# Patient Record
Sex: Female | Born: 1955 | Race: White | Hispanic: No | Smoking: Never smoker
Health system: Southern US, Community
[De-identification: ages and names within clinical notes are randomized; demographics above are authoritative.]

## PROBLEM LIST (undated history)

## (undated) DIAGNOSIS — G2401 Drug induced subacute dyskinesia: Secondary | ICD-10-CM

## (undated) DIAGNOSIS — N183 Chronic kidney disease, stage 3 unspecified: Secondary | ICD-10-CM

## (undated) DIAGNOSIS — E119 Type 2 diabetes mellitus without complications: Secondary | ICD-10-CM

## (undated) DIAGNOSIS — E039 Hypothyroidism, unspecified: Secondary | ICD-10-CM

## (undated) DIAGNOSIS — H269 Unspecified cataract: Secondary | ICD-10-CM

## (undated) DIAGNOSIS — F3160 Bipolar disorder, current episode mixed, unspecified: Secondary | ICD-10-CM

## (undated) DIAGNOSIS — R3915 Urgency of urination: Secondary | ICD-10-CM

## (undated) DIAGNOSIS — N393 Stress incontinence (female) (male): Secondary | ICD-10-CM

## (undated) DIAGNOSIS — I10 Essential (primary) hypertension: Secondary | ICD-10-CM

## (undated) DIAGNOSIS — Z9989 Dependence on other enabling machines and devices: Secondary | ICD-10-CM

## (undated) DIAGNOSIS — R030 Elevated blood-pressure reading, without diagnosis of hypertension: Secondary | ICD-10-CM

## (undated) DIAGNOSIS — R351 Nocturia: Secondary | ICD-10-CM

## (undated) DIAGNOSIS — G4733 Obstructive sleep apnea (adult) (pediatric): Secondary | ICD-10-CM

## (undated) DIAGNOSIS — J302 Other seasonal allergic rhinitis: Secondary | ICD-10-CM

## (undated) DIAGNOSIS — M1712 Unilateral primary osteoarthritis, left knee: Secondary | ICD-10-CM

## (undated) DIAGNOSIS — G5600 Carpal tunnel syndrome, unspecified upper limb: Secondary | ICD-10-CM

## (undated) DIAGNOSIS — H9190 Unspecified hearing loss, unspecified ear: Secondary | ICD-10-CM

## (undated) DIAGNOSIS — E785 Hyperlipidemia, unspecified: Secondary | ICD-10-CM

## (undated) DIAGNOSIS — F419 Anxiety disorder, unspecified: Secondary | ICD-10-CM

## (undated) HISTORY — DX: Anxiety disorder, unspecified: F41.9

## (undated) HISTORY — DX: Drug induced subacute dyskinesia: G24.01

## (undated) HISTORY — DX: Carpal tunnel syndrome, unspecified upper limb: G56.00

## (undated) HISTORY — DX: Hyperlipidemia, unspecified: E78.5

## (undated) HISTORY — DX: Essential (primary) hypertension: I10

## (undated) HISTORY — DX: Hypothyroidism, unspecified: E03.9

## (undated) HISTORY — PX: OTHER SURGICAL HISTORY: SHX169

## (undated) HISTORY — PX: TUBAL LIGATION: SHX77

---

## 2000-05-31 ENCOUNTER — Ambulatory Visit (HOSPITAL_COMMUNITY): Admission: RE | Admit: 2000-05-31 | Discharge: 2000-05-31 | Payer: Self-pay | Admitting: Internal Medicine

## 2000-05-31 ENCOUNTER — Encounter: Payer: Self-pay | Admitting: Internal Medicine

## 2003-09-12 ENCOUNTER — Inpatient Hospital Stay (HOSPITAL_COMMUNITY): Admission: AD | Admit: 2003-09-12 | Discharge: 2003-09-18 | Payer: Self-pay | Admitting: Psychiatry

## 2009-06-28 ENCOUNTER — Emergency Department (HOSPITAL_COMMUNITY): Admission: EM | Admit: 2009-06-28 | Discharge: 2009-06-28 | Payer: Self-pay | Admitting: Emergency Medicine

## 2010-06-11 LAB — POCT I-STAT, CHEM 8
BUN: 10 mg/dL (ref 6–23)
Calcium, Ion: 0.99 mmol/L — ABNORMAL LOW (ref 1.12–1.32)
Chloride: 115 mEq/L — ABNORMAL HIGH (ref 96–112)
Creatinine, Ser: 1.7 mg/dL — ABNORMAL HIGH (ref 0.4–1.2)
Glucose, Bld: 113 mg/dL — ABNORMAL HIGH (ref 70–99)
HCT: 34 % — ABNORMAL LOW (ref 36.0–46.0)
Hemoglobin: 11.6 g/dL — ABNORMAL LOW (ref 12.0–15.0)
Potassium: 4.2 mEq/L (ref 3.5–5.1)
Sodium: 144 mEq/L (ref 135–145)
TCO2: 16 mmol/L (ref 0–100)

## 2010-08-08 NOTE — Discharge Summary (Signed)
Denise Serrano, Denise Serrano                         ACCOUNT NO.:  [id number]   MEDICAL RECORD NO.:  [id number]                   PATIENT TYPE:  IPS   LOCATION:  0502                                 FACILITY:  [facility]   PHYSICIAN:  Rulon Eisenmenger, M.D.              DATE OF BIRTH:  1955-09-20   DATE OF ADMISSION:  09/12/2003  DATE OF DISCHARGE:  09/18/2003                                 DISCHARGE SUMMARY   IDENTIFYING DATA:  This is a 55 year old Caucasian female, divorced,  voluntarily admitted with a 15-year history of bipolar disorder.  Presenting  to therapist with suicidal ideation.  Had taken too much Lamictal over the  past three days, possibly 400 mg on Tuesday.  The patient reported that she  was trying to get attention.  Endorsed increased stress, mood lability for a  year.  Worse now over the past month with increased irritability, restless  sleep, decreased concentration, rapid, racing thoughts.  Denied auditory or  visual hallucinations.  Positive getting confused regarding medications.  History of manic episode with increased spending and hypersexuality in the  past.   PAST PSYCHIATRIC HISTORY:  Had been followed up by Dr. Toy Care.  [place] admission.  First psychiatric inpatient treatment.  No prior suicide attempts.   MEDICATIONS:  Lamictal (supposed to be on 125 mg possibly to increase to 150  mg or 200 mg; patient was unclear), Effexor XR 150 mg b.i.d. x 2 months (had  been on this increased dose), Wellbutrin SR 200 mg b.i.d. and Depakote 500  mg q.h.s.   ALLERGIES:  No known drug allergies.   PHYSICAL EXAMINATION:  Physical exam and neurologic essentially within  normal limits.  Mild fine tremor with no coordination difficulties.   MENTAL STATUS EXAM:  Fully alert, slightly tremulous with bright affect,  labile at times.  Decreased attention and concentration.  Mood normal.  Volume rate and tone, somewhat hyperverbal at times.  Mood depressed.  Affect somewhat labile at times.  Thought processes goal directed.  Rapid  but mostly goal directed, occasionally tangential.  Positive suicidal  ideation.  Cognitively intact.  Some impulsive or intrusive thoughts.  Insight and judgment were fair.   ADMISSION DIAGNOSES:   AXIS I:  Bipolar disorder, type 1, mixed state.   AXIS II:  Deferred.   AXIS III:  Hypertension by history.   AXIS IV:  Moderate (stressors including parenting and job issues and limited  support system).   AXIS V:  24/65.   HOSPITAL COURSE:  The patient was admitted and ordered routine p.r.n.  medications and underwent further monitoring.  Was encouraged to participate  in individual, group and milieu therapy.  The patient was placed on safety  checks.  Effexor was decreased as indicated due to mood lability.  Wellbutrin was changed to 300 mg XL q.a.m. and Ativan 0.5 mg t.i.d. p.r.n.  anxiety.  Ambien was used for sleep  and Depakote was optimized.  Lamictal  held due to overdose.  The patient remained somewhat manic with a labile  mood and racing thoughts.  Valproic acid level was 26.  Question regarding  Depakote overdose.  The patient was anxious and admitted to impulsivity and  passive suicidal ideation.  Depakote was optimized and patient was monitored  regarding response and safety as well as participate in individual, group  and milieu therapy and aftercare planning.  Depakote was further optimized.  Level rechecked.  The patient felt a positive response from medication  changed.  Again, Lamictal was discontinued and Depakote further optimized.  Effexor had been decreased and Wellbutrin changed from SR form to XL.   CONDITION ON DISCHARGE:  The patient showed remarkable improvement.  Mood  was more euthymic.  Affect brighter.  The patient was clam with no psychotic  symptoms.  No dangerous ideation.  No significant anxiety or panic symptoms.  No other risk issues.  She was given medication education.    DISCHARGE MEDICATIONS:  1. Effexor XR 150 mg q.a.m.  2. Wellbutrin XL 300 mg q.a.m.  3. Depakote ER 500 mg, 3 q.h.s.  4. Ambien 10 mg q.h.s. p.r.n. insomnia.  5. Ativan 0.5 mg t.i.d. p.r.n. severe anxiety.   FOLLOW UP:  The patient was given a copy of labs to take to primary care  physician for follow-up regarding hormone levels and to follow up with Dr.  Toy Care on October 03, 2003 at 4:30 p.m. and Tomi Bamberger on September 25, 2003 at 10  a.m.   DISCHARGE DIAGNOSES:   AXIS I:  Bipolar disorder, type 1, mixed state.   AXIS II:  Deferred.   AXIS III:  Hypertension by history.   AXIS IV:  Moderate (stressors including parenting and job issues and limited  support system).   AXIS V:  Global Assessment of Functioning on discharge 55.                                               Rulon Eisenmenger, M.D.    JEM/MEDQ  D:  10/12/2003  T:  10/13/2003  Job:  [id number]

## 2010-10-22 ENCOUNTER — Encounter: Payer: Self-pay | Admitting: Podiatry

## 2010-10-22 DIAGNOSIS — L600 Ingrowing nail: Secondary | ICD-10-CM | POA: Insufficient documentation

## 2011-03-30 ENCOUNTER — Other Ambulatory Visit: Payer: Self-pay | Admitting: Urology

## 2011-05-01 ENCOUNTER — Encounter (HOSPITAL_BASED_OUTPATIENT_CLINIC_OR_DEPARTMENT_OTHER): Payer: Self-pay | Admitting: *Deleted

## 2011-05-01 ENCOUNTER — Other Ambulatory Visit: Payer: Self-pay | Admitting: Urology

## 2011-05-01 NOTE — Progress Notes (Addendum)
NPO AFTER MN. NEEDS CBC, BMET, PT/PTT, T&S AND EKG. WILL TAKE SYNTHROID AND DITROPAN AM OF SURG. W/ SIP OF WATER.   ARRIVES AT 0745.

## 2011-05-04 ENCOUNTER — Encounter (HOSPITAL_BASED_OUTPATIENT_CLINIC_OR_DEPARTMENT_OTHER): Payer: Self-pay | Admitting: *Deleted

## 2011-05-06 NOTE — H&P (Signed)
History of Present Illness   Denise Serrano primarily has stress incontinence and a little bit of enuresis. She failed 3 medications, initially followed by Dr. Terance Hart. She had a positive cough test and she used to be on Septra for chronic cystitis. She has mild frequency and nocturia. 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 She has some issues with bipolar illness.   [facility] wanted a BMP and urinalysis ordered today and we will fax the results to them.  On urodynamics, she did not void and was catheterized for 150 mL. I did not a have a residual from her first visit. Her maximum capacity was 726 mL. Her bladder was stable. She had mild leakage at a Valsalva leak point pressure of 87 cm of water. It was as low as 25 cm of water at 400 mL, but it was mild. During voiding, she voided 671 mL with a maximum flow of 22 mL per second. Maximum voiding pressure is 34 cm of water. Her residual was 55 mL. She had increasing EMG activity during her voiding phase. Bladder neck descended 2 to 3 cm. The details of the urodynamics was signed and dictated on the urodynamics sheet.  Review of systems: No change in bowel or neurologic systems.  Urinalysis: I reviewed. Moderate bacteria. Urine sent for culture. Her last culture was normal.    Past Medical History Problems  1. History of  Anxiety (Symptom) 300.00 2. History of  Arthritis V13.4 3. History of  Asthma 493.90 4. History of  Esophageal Reflux 530.81 5. History of  Hypercholesterolemia 272.0 6. History of  Hypertension 401.9  Surgical History Problems  1. History of  No Surgical Problems 2. History of  Partial Permanent Excision Nail, Matrix Left First Toe  Current Meds 1. Aspirin 81 MG Oral Tablet; Therapy: (Recorded:08Jun2011) to 2. Astepro SOLN; Therapy: (Recorded:14Nov2011) to 3. Calcium TABS; Therapy: (Recorded:14Nov2011) to 4. Geodon 60 MG Oral Capsule; Therapy: (Recorded:14Nov2011) to 5.  Levothyroxine Sodium 50 MCG Oral Tablet; Therapy: [id number] to 6. Multi-Vitamin Oral Tablet; Therapy: (Recorded:14Nov2011) to 7. Oxybutynin Chloride TABS; Therapy: (Recorded:12Nov2012) to 8. ProAir HFA AERS; Therapy: (Recorded:14Nov2011) to 9. Probiotic CAPS; Therapy: (Recorded:14Nov2011) to 10. Stool Softener TABS; Therapy: (Recorded:14Nov2011) to 11. Symbyax 6-25 MG Oral Capsule; Therapy: (Recorded:16May2012) to 12. Valtrex 500 MG Oral Tablet; Therapy: (Recorded:16May2012) to 13. Vitamin D TABS; Therapy: (Recorded:16May2012) to 14. Wellbutrin XL 300 MG Oral Tablet Extended Release 24 Hour; Therapy: (Recorded:14Aug2012) to 15. Xanax 0.5 MG Oral Tablet; Therapy: (Recorded:08Jun2011) to  Allergies Medication  1. No Known Drug Allergies  Family History Problems  1. Family history of  Death In The Family Father deceased age 73--lung cancer 2. Family history of  Death In The Family Mother deceased age 74 3. Family history of  Family Health Status Number Of Children 1 son 1 daughter 4. Paternal history of  Lung Cancer V16.1  Social History Problems  1. Caffeine Use 2 tea 2. Marital History - Single 3. Never A Smoker 4. Physical Disability Affecting Ability To Work Denied  5. History of  Alcohol Use 6. History of  Tobacco Use  Results/Data  20 Mar 2011 1:17 PM   UA With REFLEX       COLOR YELLOW       APPEARANCE CLEAR       SPECIFIC GRAVITY 1.020       pH 6.0       GLUCOSE NEG       BILIRUBIN NEG  KETONE NEG       BLOOD TRACE       PROTEIN NEG       UROBILINOGEN 0.2       NITRITE NEG       LEUKOCYTE ESTERASE MOD       SQUAMOUS EPITHELIAL/HPF RARE       WBC TNTC       CRYSTALS NONE SEEN       CASTS NONE SEEN       RBC 0-3       BACTERIA MODERATE       Other FEW RTE OBS      Assessment Assessed  1. Chronic Cystitis 595.2 2. Female Stress Incontinence 625.6  Plan   Discussion/Summary   I drew Denise Serrano a picture. I think her primary problem is  stress incontinence. I talked to her about watchful waiting versus physical therapy and behavioral therapy versus a sling.  We talked about a sling in detail. Pros, cons, general surgical and anesthetic risks, and other options including behavioral therapy and watchful waiting were discussed. She understands that slings are generally successful in 90% of cases for stress incontinence, 50% for urge incontinence, and that in a small percentage of cases the incontinence can worsen. The risk of persistent, de novo, or worsening incontinence/dysfunction was discussed. Risks were described but not limited to the discussion of injury to neighboring structures including the bowel (with possible life-threatening sepsis and colostomy), bladder, urethra, vagina (all resulting in further surgery), and ureter (resulting in re-implantation). We also talked about the risk of retention requiring urethrolysis, extrusion requiring revision, and erosion resulting in further surgery. Bleeding risks and transfusion rates and the risk of infection were discussed. The risk of pelvic and abdominal pain syndromes, dyspareunia, and neuropathies were discussed. The need for CIC was described as well the usual post-operative course. The patient understands that she might not reach her treatment goal and that she might be worse following surgery.  She understands that her enuresis may persist. She has failed medications.   Twenty-five minutes or longer was used for review and discussion with the patient.  After a thorough review of the management options for the patient's condition the patient  elected to proceed with surgical therapy as noted above. We have discussed the potential benefits and risks of the procedure, side effects of the proposed treatment, the likelihood of the patient achieving the goals of the procedure, and any potential problems that might occur during the procedure or recuperation. Informed consent has been  obtained.

## 2011-05-07 ENCOUNTER — Other Ambulatory Visit: Payer: Self-pay

## 2011-05-07 ENCOUNTER — Encounter (HOSPITAL_BASED_OUTPATIENT_CLINIC_OR_DEPARTMENT_OTHER): Payer: Self-pay | Admitting: Anesthesiology

## 2011-05-07 ENCOUNTER — Ambulatory Visit (HOSPITAL_BASED_OUTPATIENT_CLINIC_OR_DEPARTMENT_OTHER)
Admission: RE | Admit: 2011-05-07 | Discharge: 2011-05-07 | Disposition: A | Discharge status: Home / Self Care | Payer: Medicare Other | Source: Ambulatory Visit | Attending: Urology | Admitting: Urology

## 2011-05-07 ENCOUNTER — Encounter (HOSPITAL_BASED_OUTPATIENT_CLINIC_OR_DEPARTMENT_OTHER)
Admission: RE | Disposition: A | Discharge status: Home / Self Care | Payer: Self-pay | Source: Ambulatory Visit | Attending: Urology

## 2011-05-07 ENCOUNTER — Encounter (HOSPITAL_BASED_OUTPATIENT_CLINIC_OR_DEPARTMENT_OTHER): Payer: Self-pay | Admitting: *Deleted

## 2011-05-07 ENCOUNTER — Ambulatory Visit (HOSPITAL_BASED_OUTPATIENT_CLINIC_OR_DEPARTMENT_OTHER): Payer: Medicare Other | Admitting: Anesthesiology

## 2011-05-07 DIAGNOSIS — Z7982 Long term (current) use of aspirin: Secondary | ICD-10-CM | POA: Insufficient documentation

## 2011-05-07 DIAGNOSIS — N393 Stress incontinence (female) (male): Secondary | ICD-10-CM | POA: Insufficient documentation

## 2011-05-07 DIAGNOSIS — K219 Gastro-esophageal reflux disease without esophagitis: Secondary | ICD-10-CM | POA: Insufficient documentation

## 2011-05-07 DIAGNOSIS — J45909 Unspecified asthma, uncomplicated: Secondary | ICD-10-CM | POA: Insufficient documentation

## 2011-05-07 DIAGNOSIS — N3020 Other chronic cystitis without hematuria: Secondary | ICD-10-CM | POA: Insufficient documentation

## 2011-05-07 DIAGNOSIS — I10 Essential (primary) hypertension: Secondary | ICD-10-CM | POA: Insufficient documentation

## 2011-05-07 DIAGNOSIS — E7800 Pure hypercholesterolemia, unspecified: Secondary | ICD-10-CM | POA: Insufficient documentation

## 2011-05-07 DIAGNOSIS — Z79899 Other long term (current) drug therapy: Secondary | ICD-10-CM | POA: Insufficient documentation

## 2011-05-07 HISTORY — DX: Chronic kidney disease, stage 3 unspecified: N18.30

## 2011-05-07 HISTORY — DX: Chronic kidney disease, stage 3 (moderate): N18.3

## 2011-05-07 HISTORY — DX: Elevated blood-pressure reading, without diagnosis of hypertension: R03.0

## 2011-05-07 HISTORY — DX: Bipolar disorder, current episode mixed, unspecified: F31.60

## 2011-05-07 HISTORY — DX: Obstructive sleep apnea (adult) (pediatric): G47.33

## 2011-05-07 HISTORY — DX: Urgency of urination: R39.15

## 2011-05-07 HISTORY — DX: Unilateral primary osteoarthritis, left knee: M17.12

## 2011-05-07 HISTORY — DX: Dependence on other enabling machines and devices: Z99.89

## 2011-05-07 HISTORY — DX: Unspecified cataract: H26.9

## 2011-05-07 HISTORY — DX: Other seasonal allergic rhinitis: J30.2

## 2011-05-07 HISTORY — DX: Nocturia: R35.1

## 2011-05-07 HISTORY — PX: BLADDER SUSPENSION: SHX72

## 2011-05-07 HISTORY — DX: Stress incontinence (female) (male): N39.3

## 2011-05-07 HISTORY — DX: Unspecified hearing loss, unspecified ear: H91.90

## 2011-05-07 LAB — BASIC METABOLIC PANEL
BUN: 32 mg/dL — ABNORMAL HIGH (ref 6–23)
CO2: 27 mEq/L (ref 19–32)
Calcium: 9.4 mg/dL (ref 8.4–10.5)
Chloride: 103 mEq/L (ref 96–112)
Creatinine, Ser: 1.7 mg/dL — ABNORMAL HIGH (ref 0.50–1.10)
Glucose, Bld: 92 mg/dL (ref 70–99)

## 2011-05-07 LAB — CBC
HCT: 41.3 % (ref 36.0–46.0)
Hemoglobin: 13.7 g/dL (ref 12.0–15.0)
MCH: 32.2 pg (ref 26.0–34.0)
MCV: 96.9 fL (ref 78.0–100.0)
Platelets: 191 10*3/uL (ref 150–400)
RBC: 4.26 MIL/uL (ref 3.87–5.11)

## 2011-05-07 LAB — TYPE AND SCREEN: Antibody Screen: NEGATIVE

## 2011-05-07 SURGERY — CREATION, URETHRAL SLING, RETROPUBIC APPROACH, USING POLYPROPYLENE TAPE
Anesthesia: General | Site: Vagina | Wound class: Clean Contaminated

## 2011-05-07 MED ORDER — ONDANSETRON HCL 4 MG/2ML IJ SOLN
INTRAMUSCULAR | Status: DC | PRN
  Administered 2011-05-07: 4 mg via INTRAVENOUS

## 2011-05-07 MED ORDER — INDIGOTINDISULFONATE SODIUM 8 MG/ML IJ SOLN
INTRAMUSCULAR | Status: DC | PRN
  Administered 2011-05-07: 40 mg via INTRAVENOUS

## 2011-05-07 MED ORDER — AMPICILLIN-SULBACTAM SODIUM 1.5 (1-0.5) G IJ SOLR
1.5000 g | INTRAMUSCULAR | Status: AC
  Administered 2011-05-07: 08:00:00 via INTRAVENOUS

## 2011-05-07 MED ORDER — CIPROFLOXACIN HCL 250 MG PO TABS: 250.0000 mg | ORAL_TABLET | Freq: Two times a day (BID) | ORAL | Status: AC

## 2011-05-07 MED ORDER — ESTRADIOL 0.1 MG/GM VA CREA
TOPICAL_CREAM | VAGINAL | Status: DC | PRN
  Administered 2011-05-07: 1 via VAGINAL

## 2011-05-07 MED ORDER — HYDROCODONE-ACETAMINOPHEN 5-325 MG PO TABS
1.0000 | ORAL_TABLET | Freq: Four times a day (QID) | ORAL | Status: DC | PRN
  Administered 2011-05-07: 1 via ORAL

## 2011-05-07 MED ORDER — LIDOCAINE HCL (CARDIAC) 20 MG/ML IV SOLN
INTRAVENOUS | Status: DC | PRN
  Administered 2011-05-07: 100 mg via INTRAVENOUS

## 2011-05-07 MED ORDER — PROMETHAZINE HCL 25 MG/ML IJ SOLN: 6.2500 mg | INTRAMUSCULAR | Status: DC | PRN

## 2011-05-07 MED ORDER — FENTANYL CITRATE 0.05 MG/ML IJ SOLN: 25.0000 ug | INTRAMUSCULAR | Status: DC | PRN

## 2011-05-07 MED ORDER — SODIUM CHLORIDE 0.9 % IR SOLN
Status: DC | PRN
  Administered 2011-05-07: 10:00:00

## 2011-05-07 MED ORDER — HYDROCODONE-ACETAMINOPHEN 5-500 MG PO TABS: 1.0000 | ORAL_TABLET | Freq: Four times a day (QID) | ORAL | Status: AC | PRN

## 2011-05-07 MED ORDER — LIDOCAINE-EPINEPHRINE (PF) 1 %-1:200000 IJ SOLN
INTRAMUSCULAR | Status: DC | PRN
  Administered 2011-05-07: 3 mL

## 2011-05-07 MED ORDER — MIDAZOLAM HCL 5 MG/5ML IJ SOLN
INTRAMUSCULAR | Status: DC | PRN
  Administered 2011-05-07: 2 mg via INTRAVENOUS

## 2011-05-07 MED ORDER — SUCCINYLCHOLINE CHLORIDE 20 MG/ML IJ SOLN
INTRAMUSCULAR | Status: DC | PRN
  Administered 2011-05-07: 80 mg via INTRAVENOUS

## 2011-05-07 MED ORDER — STERILE WATER FOR IRRIGATION IR SOLN
Status: DC | PRN
  Administered 2011-05-07: 3000 mL

## 2011-05-07 MED ORDER — DEXAMETHASONE SODIUM PHOSPHATE 4 MG/ML IJ SOLN
INTRAMUSCULAR | Status: DC | PRN
  Administered 2011-05-07: 4 mg via INTRAVENOUS

## 2011-05-07 MED ORDER — LACTATED RINGERS IV SOLN
INTRAVENOUS | Status: DC | PRN
  Administered 2011-05-07: 09:00:00 via INTRAVENOUS

## 2011-05-07 MED ORDER — FENTANYL CITRATE 0.05 MG/ML IJ SOLN
INTRAMUSCULAR | Status: DC | PRN
  Administered 2011-05-07 (×2): 100 ug via INTRAVENOUS

## 2011-05-07 MED ORDER — PROPOFOL 10 MG/ML IV EMUL
INTRAVENOUS | Status: DC | PRN
  Administered 2011-05-07: 200 mg via INTRAVENOUS

## 2011-05-07 MED ORDER — GENTAMICIN IN SALINE 1.6-0.9 MG/ML-% IV SOLN
80.0000 mg | INTRAVENOUS | Status: AC
  Administered 2011-05-07: 80 mg via INTRAVENOUS

## 2011-05-07 MED ORDER — LACTATED RINGERS IV SOLN
INTRAVENOUS | Status: DC
  Administered 2011-05-07 (×2): via INTRAVENOUS

## 2011-05-07 SURGICAL SUPPLY — 55 items
BAG URINE DRAINAGE (UROLOGICAL SUPPLIES) IMPLANT
BLADE HEX COATED 2.75 (ELECTRODE) ×2 IMPLANT
BLADE SURG 10 STRL SS (BLADE) ×2 IMPLANT
BLADE SURG 15 STRL LF DISP TIS (BLADE) ×1 IMPLANT
BLADE SURG 15 STRL SS (BLADE) ×1
BLADE SURG ROTATE 9660 (MISCELLANEOUS) ×2 IMPLANT
CANISTER SUCT LVC 12 LTR MEDI- (MISCELLANEOUS) IMPLANT
CANISTER SUCTION 1200CC (MISCELLANEOUS) ×4 IMPLANT
CATH FOLEY 2WAY SLVR  5CC 16FR (CATHETERS) ×1
CATH FOLEY 2WAY SLVR 5CC 16FR (CATHETERS) ×1 IMPLANT
CLOTH BEACON ORANGE TIMEOUT ST (SAFETY) ×2 IMPLANT
COVER MAYO STAND STRL (DRAPES) ×4 IMPLANT
COVER TABLE BACK 60X90 (DRAPES) ×2 IMPLANT
DERMABOND ADHESIVE PROPEN (GAUZE/BANDAGES/DRESSINGS)
DERMABOND ADVANCED (GAUZE/BANDAGES/DRESSINGS) ×1
DERMABOND ADVANCED .7 DNX12 (GAUZE/BANDAGES/DRESSINGS) ×1 IMPLANT
DERMABOND ADVANCED .7 DNX6 (GAUZE/BANDAGES/DRESSINGS) IMPLANT
DRAIN PENROSE 18X1/4 LTX STRL (WOUND CARE) IMPLANT
DRAPE UNDERBUTTOCKS STRL (DRAPE) ×2 IMPLANT
ELECT REM PT RETURN 9FT ADLT (ELECTROSURGICAL) ×2
ELECTRODE REM PT RTRN 9FT ADLT (ELECTROSURGICAL) ×1 IMPLANT
GAUZE SPONGE 4X4 16PLY XRAY LF (GAUZE/BANDAGES/DRESSINGS) IMPLANT
GLOVE BIO SURGEON STRL SZ7.5 (GLOVE) ×4 IMPLANT
GLOVE BIOGEL PI IND STRL 6.5 (GLOVE) ×1 IMPLANT
GLOVE BIOGEL PI INDICATOR 6.5 (GLOVE) ×1
GLOVE ECLIPSE 6.0 STRL STRAW (GLOVE) ×2 IMPLANT
GOWN STRL NON-REIN LRG LVL3 (GOWN DISPOSABLE) ×4 IMPLANT
GOWN STRL REIN XL XLG (GOWN DISPOSABLE) ×2 IMPLANT
HOLDER FOLEY CATH W/STRAP (MISCELLANEOUS) ×2 IMPLANT
HOOK RETRACTION 12 ELAST STAY (MISCELLANEOUS) IMPLANT
NEEDLE HYPO 22GX1.5 SAFETY (NEEDLE) ×2 IMPLANT
NS IRRIG 500ML POUR BTL (IV SOLUTION) IMPLANT
PACK BASIN DAY SURGERY FS (CUSTOM PROCEDURE TRAY) ×2 IMPLANT
PACKING VAGINAL (PACKING) ×2 IMPLANT
PENCIL BUTTON HOLSTER BLD 10FT (ELECTRODE) ×2 IMPLANT
PLUG CATH AND CAP STER (CATHETERS) ×2 IMPLANT
RETRACTOR STERILE 25.8CMX11.3 (INSTRUMENTS) IMPLANT
SET IRRIG Y TYPE TUR BLADDER L (SET/KITS/TRAYS/PACK) ×2 IMPLANT
SHEET LAVH (DRAPES) ×2 IMPLANT
SLING SYSTEM SPARC (Sling) ×2 IMPLANT
SURGILUBE 2OZ TUBE FLIPTOP (MISCELLANEOUS) ×2 IMPLANT
SUT SILK 2 0 30  PSL (SUTURE)
SUT SILK 2 0 30 PSL (SUTURE) IMPLANT
SUT VIC AB 2-0 CT1 27 (SUTURE) ×2
SUT VIC AB 2-0 CT1 TAPERPNT 27 (SUTURE) ×2 IMPLANT
SUT VICRYL 4-0 PS2 18IN ABS (SUTURE) ×2 IMPLANT
SYR BULB IRRIGATION 50ML (SYRINGE) ×2 IMPLANT
SYR CONTROL 10ML LL (SYRINGE) ×2 IMPLANT
SYRINGE 10CC LL (SYRINGE) ×2 IMPLANT
TOWEL OR 17X24 6PK STRL BLUE (TOWEL DISPOSABLE) ×4 IMPLANT
TRAY DSU PREP LF (CUSTOM PROCEDURE TRAY) ×2 IMPLANT
TUBE CONNECTING 12X1/4 (SUCTIONS) ×4 IMPLANT
WATER STERILE IRR 3000ML UROMA (IV SOLUTION) ×2 IMPLANT
WATER STERILE IRR 500ML POUR (IV SOLUTION) ×2 IMPLANT
YANKAUER SUCT BULB TIP NO VENT (SUCTIONS) ×2 IMPLANT

## 2011-05-07 NOTE — Transfer of Care (Signed)
Immediate Anesthesia Transfer of Care Note  Patient: Denise Serrano  Procedure(s) Performed: Procedure(s) (LRB): SPARC PROCEDURE (N/A)  Patient Location: PACU  Anesthesia Type: General  Level of Consciousness: awake, sedated, patient cooperative and responds to stimulation  Airway & Oxygen Therapy: Patient Spontanous Breathing and Patient connected to face mask oxygen w/ oral airway  Post-op Assessment: Report given to PACU RN, Post -op Vital signs reviewed and stable and Patient moving all extremities  Post vital signs: Reviewed and stable  Complications: No apparent anesthesia complications

## 2011-05-07 NOTE — Anesthesia Procedure Notes (Addendum)
Performed by: Charleston Ropes   Procedure Name: Intubation Date/Time: 05/07/2011 9:17 AM Performed by: Charleston Ropes Pre-anesthesia Checklist: Patient identified, Emergency Drugs available, Suction available and Patient being monitored Patient Re-evaluated:Patient Re-evaluated prior to inductionOxygen Delivery Method: Circle System Utilized Preoxygenation: Pre-oxygenation with 100% oxygen Intubation Type: IV induction Ventilation: Mask ventilation without difficulty Grade View: Grade II Tube type: Oral Tube size: 7.0 mm Number of attempts: 1 Airway Equipment and Method: stylet and oral airway Placement Confirmation: ETT inserted through vocal cords under direct vision,  positive ETCO2 and breath sounds checked- equal and bilateral Secured at: 23 cm Tube secured with: Tape Dental Injury: Teeth and Oropharynx as per pre-operative assessment

## 2011-05-07 NOTE — Anesthesia Postprocedure Evaluation (Signed)
  Anesthesia Post-op Note  Patient: Denise Serrano  Procedure(s) Performed: Procedure(s) (LRB): SPARC PROCEDURE (N/A)  Patient Location: PACU  Anesthesia Type: General  Level of Consciousness: oriented and sedated  Airway and Oxygen Therapy: Patient Spontanous Breathing  Post-op Pain: mild  Post-op Assessment: Post-op Vital signs reviewed, Patient's Cardiovascular Status Stable, Respiratory Function Stable and Patent Airway  Post-op Vital Signs: stable  Complications: No apparent anesthesia complications

## 2011-05-07 NOTE — Op Note (Signed)
Preoperative diagnosis: Stress urinary incontinence  Postoperative diagnosis: Stress urinary incontinence  Procedure: Sling cystourethropexy [facility]) and Cystoscopy  Surgeon: Dr. Nicki Reaper Timtohy Broski  Asstistant: None  Estimated blood loss: < 50 milliliters  Specimens: None  The patient has the above diagnoses and consented to the above procedure.  The patient was prepped and draped in the usual fashion. Extra care was taken with leg positioning to minimize the  risk of compartment syndrome, neuropathy, and deep vein thrombosis. Preoperative laboratory tests were normal and preoperative antibiotics were given.  Two less than 1 cm incisions were made 1 fingerbreadth above the symphysis pubis 1.5 cm lateral to the midline. A 2 cm appropriate depth suburethral incision was made underneath the mid urethra after instilling approximately 5 cc of 1% lidocaine mixture. I sharply and bluntly dissected to the urethral vesical angle bilaterally.  With the bladder empty I passed a SPARC needle on top of and along the back of the symphysis pubis staying  on the periosteum and staying lateral using my box technique and delivering the needle onto the pulp of my  index finger bilaterally.  I cystoscoped the patient thoroughly and there was no injury to the bladder or urethra. There was no movement  or indentation of the bladder with movement of the trocar. There was excellent efflux of blue urine bilaterally.  With the bladder emptied I attached the [facility] sling and brought it up through the retropubic space bilaterally. I tensioned it over the fat part of a moderate size Kelly clamp. I cut below the blue dots, irrigated the sheaths, and removed the sheaths. I was very happy with the position and tension of the sling With appropriate hypermobility and no springback effect.  All incisions were irrigated. The sling was cut below the skin level. I closed the anterior vaginal wall with  running 2-0 Vicryl  followed by my interrupted sutures. Interrupted 4-0 Vicryl was used for the abdominal incisions.  A catheter plug was used with the Foley catheter as well as a vaginal pack.  The patient was taken to the recovery room and hopefully this procedure will reach her treatment goal.

## 2011-05-07 NOTE — Progress Notes (Signed)
Small bruised area noted to lower lip on inside area.

## 2011-05-07 NOTE — Discharge Instructions (Signed)
I have reviewed discharge instructions in detail with the patient. They will follow-up with me or their physician as scheduled. My nurse will also be calling the patients as per protocol.  General Anesthetic, Adult A doctor specialized in giving anesthesia (anesthesiologist) or a nurse specialized in giving anesthesia (nurse anesthetist) gives medicine that makes you sleep while a procedure is performed (general anesthetic). Once the general anesthetic has been administered, you will be in a sleeplike state in which you feel no pain. After having a general anestheticyou may feel:   Dizzy.   Weak.   Drowsy.   Confused.  These feelings are normal and can be expected to last for up to 24 hours after the procedure is completed.  LET YOUR CAREGIVER KNOW ABOUT:  Allergies you have.   Medications you are taking, including herbs, eye drops, over the counter medications, dietary supplements, and creams.   Previous problems you have had with anesthetics or numbing medicines.   Use of cigarettes, alcohol, or illicit drugs.   Possibility of pregnancy, if this applies.   History of bleeding or blood disorders, including blood clots and clotting disorders.   Previous surgeries you have had and types of anesthetics you have received.   Family medical history, especially anesthetic problems.   Other health problems.  BEFORE THE PROCEDURE  You may brush your teeth on the morning of surgery but you should have no solid food or non-clear liquids for a minimum of 8 hours prior to your procedure. Clear liquids (water, apple juice, black coffee, and tea) are acceptable in small amounts until 2 hours prior to your procedure.   You may take your regular medications the morning of your procedure unless your caregiver indicates otherwise.  AFTER THE PROCEDURE  After surgery, you will be taken to the recovery area where a nurse will monitor your progress. You will be allowed to go home when you are  awake, stable, taking fluids well, and without serious pain or complications.   For the first 24 hours following an anesthetic:   Have a responsible person with you.   Do not drive a car. If you are alone, do not take public transportation.   Do not engage in strenuous activity. You may usually resume normal activities the next day, or as advised by your caregiver.   Do not drink alcohol.   Do not take medicine that has not been prescribed by your caregiver.   Do not sign important papers or make important decisions as your judgement may be impaired.   You may resume a normal diet as directed.   Change bandages (dressings) as directed.   Only take over-the-counter or prescription medicines for pain, discomfort, or fever as directed by your caregiver.  If you have questions or problems that seem related to the anesthetic, call the hospital and ask for the anesthetist, anesthesiologist, or anesthesia department. SEEK IMMEDIATE MEDICAL CARE IF:   You develop a rash.   You have difficulty breathing.   You have chest pain.   You have allergic problems.   You have uncontrolled nausea.   You have uncontrolled vomiting.   You develop any serious bleeding, especially from the incision site.  Document Released: 06/16/2007 Document Revised: 11/19/2010 Document Reviewed: 07/10/2010 [facility] Patient Information 2012 [place].Urethral Vaginal Sling A urethral vaginal sling can be done to correct urinary incontinence (UI). Urinary incontinence is uncontrolled loss of urine. It is very common in both women who have had children and in aging women. The  purpose of the "urethral vaginal sling" is to place a strong piece of material (i.e., tension-free vaginal tape or nylon mesh) that fits under the urethra like a hammock. The urethra is the tube that drains your bladder. This sling is put in position to straighten, support and hold the urethra in its normal position. This is a common  surgical procedure for this problem. LET YOUR CAREGIVER KNOW ABOUT:   Any allergies to foods or medications.   All the medications you are taking. This includes over-the-counter drugs, herbs, eye drops, creams and steroids.   Use of illegal drugs.   Smoking or heavy alcohol drinking.   Past problems with anesthetics.   Possibility of being pregnant.   History of blood clots or other blood problems.   History of bleeding problems.   Past surgery.   Other medical or health problems.  RISKS AND COMPLICATIONS   Infection.   Excessive bleeding.   Damage to other organs.   Problems urinating properly for several days or weeks.   Problems from the anesthesia.   The UI can come back.  BEFORE THE PROCEDURE   Do not take aspirin or blood thinners a week before the surgery unless you are told otherwise.   Do not eat or drink anything 8 hours before the surgery.   If you smoke, do not smoke at least two weeks before the surgery.   Let your caregiver know if you get a cold or infection before your surgery.   If you will be admitted the same day as the surgery, get to the hospital at least one hour before the surgery.   Arrange for someone to take you home from the hospital and for help at home.  AFTER THE PROCEDURE   You will be taken to recovery area where nurses will monitor your progress and vital signs (blood pressure, pulse, breathing, temperature). They will move you to your room when you are stable.   You will have a catheter in your bladder until you can urinate properly.   You may have a gauze packing in the vagina to prevent bleeding. This will be removed in one to two days.   You are usually in the hospital 2 to 3 days.  HOME CARE INSTRUCTIONS   Take showers instead of baths until you are informed otherwise.   Resume your usual diet.   Get rest and sleep.   Try to have someone at home for 2 to 3 weeks to help you with your household activities.   Do  not lift anything over 5 pounds.   Only take over-the-counter or prescription medicines for pain, discomfort or fever as directed by your caregiver. Do not take aspirin because it can cause bleeding.   Do not douche, exercise, use tampons or have sexual intercourse until your caregiver gives you permission.  SEEK MEDICAL CARE IF:   You have a heavy or bad smelling vaginal discharge.   You develop a rash.   You need stronger pain medication.   You are having side effects from your medications.   You develop lightheadedness or feel faint.  SEEK IMMEDIATE MEDICAL CARE IF:   You develop a temperature of 100 F (37.8 C) or higher.   You have vaginal bleeding.   You faint or pass out.   You develop shortness of breath.   You develop chest, abdominal or leg pain.   You develop pain when urinating or cannot urinate.   Your catheter is still in your  bladder and it blocks up.   You develop swelling, redness and pain in the vaginal area.  Document Released: 12/17/2007 Document Revised: 11/19/2010 Document Reviewed: 12/17/2007 [facility] Patient Information 2012 [place].

## 2011-05-07 NOTE — Anesthesia Preprocedure Evaluation (Addendum)
Anesthesia Evaluation  Patient identified by MRN, date of birth, ID band Patient awake  General Assessment Comment:HOH  Reviewed: Allergy & Precautions, H&P , NPO status , Patient's Chart, lab work & pertinent test results, reviewed documented beta blocker date and time   Airway Mallampati: II TM Distance: >3 FB Neck ROM: Full    Dental  (+) Teeth Intact and Dental Advisory Given   Pulmonary neg pulmonary ROS,  clear to auscultation        Cardiovascular Regular Normal Borderline HTN, no Rx Denies cardiac symptoms   Neuro/Psych Negative Neurological ROS  Negative Psych ROS   GI/Hepatic negative GI ROS, Neg liver ROS,   Endo/Other  Hypothyroidism Thyroid replacement  Renal/GU Cr 1.70   SUI    Musculoskeletal negative musculoskeletal ROS (+)   Abdominal   Peds negative pediatric ROS (+)  Hematology negative hematology ROS (+)   Anesthesia Other Findings   Reproductive/Obstetrics negative OB ROS                          Anesthesia Physical Anesthesia Plan  ASA: II  Anesthesia Plan: General   Post-op Pain Management:    Induction: Intravenous  Airway Management Planned: Oral ETT  Additional Equipment:   Intra-op Plan:   Post-operative Plan: Extubation in OR  Informed Consent: I have reviewed the patients History and Physical, chart, labs and discussed the procedure including the risks, benefits and alternatives for the proposed anesthesia with the patient or authorized representative who has indicated his/her understanding and acceptance.   Dental advisory given  Plan Discussed with: CRNA and Surgeon  Anesthesia Plan Comments:         Anesthesia Quick Evaluation

## 2011-05-07 NOTE — Progress Notes (Signed)
Glasses returned to patient.

## 2011-05-07 NOTE — Interval H&P Note (Signed)
History and Physical Interval Note:  05/07/2011 7:19 AM  Denise Serrano  has presented today for surgery, with the diagnosis of stress incontinence  The various methods of treatment have been discussed with the patient and family. After consideration of risks, benefits and other options for treatment, the patient has consented to  Procedure(s) (LRB): SPARC PROCEDURE (N/A) as a surgical intervention .  The patients' history has been reviewed, patient examined, no change in status, stable for surgery.  I have reviewed the patients' chart and labs.  Questions were answered to the patient's satisfaction.     Denise Serrano A

## 2011-05-08 ENCOUNTER — Encounter (HOSPITAL_BASED_OUTPATIENT_CLINIC_OR_DEPARTMENT_OTHER): Payer: Self-pay | Admitting: Urology

## 2011-06-04 ENCOUNTER — Emergency Department (HOSPITAL_COMMUNITY)
Admission: EM | Admit: 2011-06-04 | Discharge: 2011-06-05 | Disposition: A | Discharge status: Home / Self Care | Payer: Medicare Other | Attending: Emergency Medicine | Admitting: Emergency Medicine

## 2011-06-04 ENCOUNTER — Encounter (HOSPITAL_COMMUNITY): Payer: Self-pay | Admitting: Emergency Medicine

## 2011-06-04 DIAGNOSIS — F329 Major depressive disorder, single episode, unspecified: Secondary | ICD-10-CM

## 2011-06-04 DIAGNOSIS — F3289 Other specified depressive episodes: Secondary | ICD-10-CM | POA: Insufficient documentation

## 2011-06-04 DIAGNOSIS — F411 Generalized anxiety disorder: Secondary | ICD-10-CM | POA: Insufficient documentation

## 2011-06-04 DIAGNOSIS — F1910 Other psychoactive substance abuse, uncomplicated: Secondary | ICD-10-CM | POA: Insufficient documentation

## 2011-06-04 DIAGNOSIS — F1320 Sedative, hypnotic or anxiolytic dependence, uncomplicated: Secondary | ICD-10-CM | POA: Insufficient documentation

## 2011-06-04 LAB — COMPREHENSIVE METABOLIC PANEL
ALT: 50 U/L — ABNORMAL HIGH (ref 0–35)
CO2: 29 mEq/L (ref 19–32)
Calcium: 9.9 mg/dL (ref 8.4–10.5)
Creatinine, Ser: 1.72 mg/dL — ABNORMAL HIGH (ref 0.50–1.10)
GFR calc Af Amer: 37 mL/min — ABNORMAL LOW (ref >90)
GFR calc non Af Amer: 32 mL/min — ABNORMAL LOW (ref >90)
Glucose, Bld: 99 mg/dL (ref 70–99)
Sodium: 137 mEq/L (ref 135–145)

## 2011-06-04 LAB — CBC
Hemoglobin: 13.8 g/dL (ref 12.0–15.0)
MCH: 32.4 pg (ref 26.0–34.0)
MCV: 97.4 fL (ref 78.0–100.0)
RBC: 4.26 MIL/uL (ref 3.87–5.11)

## 2011-06-04 LAB — RAPID URINE DRUG SCREEN, HOSP PERFORMED
Opiates: NOT DETECTED
Tetrahydrocannabinol: NOT DETECTED

## 2011-06-04 MED ORDER — DIVALPROEX SODIUM ER 500 MG PO TB24
1000.0000 mg | ORAL_TABLET | Freq: Every day | ORAL | Status: DC
  Administered 2011-06-04: 1000 mg via ORAL
  Filled 2011-06-04 (×3): qty 2

## 2011-06-04 MED ORDER — ADULT MULTIVITAMIN W/MINERALS CH
1.0000 | ORAL_TABLET | Freq: Two times a day (BID) | ORAL | Status: DC
  Administered 2011-06-04: 1 via ORAL
  Filled 2011-06-04: qty 1

## 2011-06-04 MED ORDER — OLANZAPINE-FLUOXETINE HCL 3-25 MG PO CAPS: 1.0000 | ORAL_CAPSULE | Freq: Every evening | ORAL | Status: DC

## 2011-06-04 MED ORDER — ZIPRASIDONE HCL 20 MG PO CAPS
120.0000 mg | ORAL_CAPSULE | Freq: Every day | ORAL | Status: DC
  Administered 2011-06-04: 120 mg via ORAL
  Filled 2011-06-04: qty 6

## 2011-06-04 MED ORDER — OLANZAPINE-FLUOXETINE HCL 6-25 MG PO CAPS
1.0000 | ORAL_CAPSULE | Freq: Every day | ORAL | Status: AC
  Administered 2011-06-04: 1 via ORAL
  Filled 2011-06-04 (×2): qty 1

## 2011-06-04 MED ORDER — DOCUSATE SODIUM 100 MG PO CAPS
100.0000 mg | ORAL_CAPSULE | Freq: Every day | ORAL | Status: DC
  Administered 2011-06-04: 100 mg via ORAL
  Filled 2011-06-04: qty 1

## 2011-06-04 MED ORDER — LEVOTHYROXINE SODIUM 50 MCG PO TABS
50.0000 ug | ORAL_TABLET | Freq: Every day | ORAL | Status: DC
  Filled 2011-06-04: qty 1

## 2011-06-04 MED ORDER — BUPROPION HCL ER (SR) 150 MG PO TB12
150.0000 mg | ORAL_TABLET | Freq: Two times a day (BID) | ORAL | Status: DC
  Administered 2011-06-04: 150 mg via ORAL
  Filled 2011-06-04 (×4): qty 1

## 2011-06-04 MED ORDER — OLANZAPINE-FLUOXETINE HCL 3-25 MG PO CAPS
1.0000 | ORAL_CAPSULE | Freq: Every evening | ORAL | Status: DC
  Filled 2011-06-04 (×2): qty 1

## 2011-06-04 MED ORDER — ALPRAZOLAM 0.5 MG PO TABS: 0.5000 mg | ORAL_TABLET | Freq: Three times a day (TID) | ORAL | Status: DC | PRN

## 2011-06-04 NOTE — ED Notes (Signed)
Pt here for detox from Xanax. Pt takes Xanax .5mg  "one after the other". Pt states she drinks a wine cooler once or twice a week. Pt BIB husband. Pt calm, cooperative.

## 2011-06-04 NOTE — ED Notes (Signed)
Patient eating sandwich, spouse at bedside.  Pt. Asked if she could have her night time medications because they helped her sleep.  Dr. Stevie Kern notified.  Meds. Ordered by Dr. Stevie Kern.

## 2011-06-04 NOTE — ED Provider Notes (Signed)
History     CSN: [id number]  Arrival date & time 06/04/11  [id number]   First MD Initiated Contact with Patient 06/04/11 2102      Chief Complaint  Patient presents with  . Medical Clearance    (Consider location/radiation/quality/duration/timing/severity/associated sxs/prior treatment) HPI This 56 year old female presents with requesting detox from benzodiazepines, as well as help with anxiety depression and suicidal ideation with plan to overdose. She denies any threats or another she denies hallucinations denies withdrawal symptoms at this time. She denies headache chest pain palpitations shortness breath abdominal pain vomiting or other concerns. She is here voluntarily. Past Medical History  Diagnosis Date  . Bipolar 1 disorder, mixed   . Impaired hearing hearing aid- bilateral  . Elevated blood pressure (not hypertension) borderline-  monitored  . Seasonal allergies   . Asthma   . Urgency of urination   . Nocturia   . OSA on CPAP   . Arthritis of knee, left   . Stress incontinence, female   . Cataract immature BILATERAL  . Chronic kidney disease (CKD), stage III (moderate) MONITORED BY DR Moshe Cipro    Past Surgical History  Procedure Date  . Tubal ligation 20 YRS AGO  . Dx laparoscopy for infertility 63 YRS AGO  . Bladder suspension 05/07/2011    Procedure: [place] PROCEDURE;  Surgeon: Reece Packer, MD;  Location: [facility];  Service: Urology;  Laterality: N/A;  cysto, sparc sling     No family history on file.  History  Substance Use Topics  . Smoking status: Never Smoker   . Smokeless tobacco: Never Used  . Alcohol Use: Yes     social    OB History    Grav Para Term Preterm Abortions TAB SAB Ect Mult Living                  Review of Systems  Constitutional: Negative for fever.       10 Systems reviewed and are negative for acute change except as noted in the HPI.  HENT: Negative for congestion.   Eyes: Negative for discharge and  redness.  Respiratory: Negative for cough and shortness of breath.   Cardiovascular: Negative for chest pain.  Gastrointestinal: Negative for vomiting and abdominal pain.  Musculoskeletal: Negative for back pain.  Skin: Negative for rash.  Neurological: Negative for syncope, numbness and headaches.  Psychiatric/Behavioral: Positive for suicidal ideas. Negative for hallucinations.       No behavior change.    Allergies  Review of patient's allergies indicates no known allergies.  Home Medications   Current Outpatient Rx  Name Route Sig Dispense Refill  . ALBUTEROL SULFATE HFA 108 (90 BASE) MCG/ACT IN AERS Inhalation Inhale 2 puffs into the lungs every 6 (six) hours as needed. For shortness of breath.    . ALPRAZOLAM 0.5 MG PO TABS Oral Take 0.5 mg by mouth as needed. For anxiety.    . ASPIRIN EC 81 MG PO TBEC Oral Take 81 mg by mouth daily.    . AZELASTINE HCL 137 MCG/SPRAY NA SOLN Nasal Place 1 spray into the nose 2 (two) times daily as needed. Use in each nostril as directed; for allergies.    Marland Kitchen BUPROPION HCL ER (SR) 150 MG PO TB12 Oral Take 150 mg by mouth 2 (two) times daily.    Marland Kitchen CALCIUM CARBONATE 600 MG PO TABS Oral Take 600 mg by mouth daily.    Marland Kitchen DIVALPROEX SODIUM ER 500 MG PO TB24 Oral Take 1,000  mg by mouth at bedtime.    Marland Kitchen DOCUSATE SODIUM 100 MG PO CAPS Oral Take 100 mg by mouth daily.    Marland Kitchen LEVOTHYROXINE SODIUM 50 MCG PO TABS Oral Take 50 mcg by mouth daily.    . ADULT MULTIVITAMIN W/MINERALS CH Oral Take 1 tablet by mouth 2 (two) times daily.    Marland Kitchen OLANZAPINE-FLUOXETINE HCL 3-25 MG PO CAPS Oral Take 1 capsule by mouth every evening.    Marland Kitchen PROBIOTIC PO Oral Take 1 capsule by mouth daily.    Marland Kitchen VALACYCLOVIR HCL 500 MG PO TABS Oral Take 500 mg by mouth daily as needed. For outbreaks.    Marland Kitchen ZIPRASIDONE HCL 60 MG PO CAPS Oral Take 120 mg by mouth at bedtime.      BP 103/43  Pulse 57  Temp(Src) 98.3 F (36.8 C) (Oral)  Resp 18  SpO2 92%  Physical Exam  Nursing note and  vitals reviewed. Constitutional: She is oriented to person, place, and time.       Awake, alert, nontoxic appearance.  HENT:  Head: Atraumatic.  Eyes: Right eye exhibits no discharge. Left eye exhibits no discharge.  Neck: Neck supple.  Pulmonary/Chest: Effort normal. She exhibits no tenderness.  Abdominal: Soft. There is no tenderness. There is no rebound.  Musculoskeletal: She exhibits no tenderness.       Baseline ROM, no obvious new focal weakness.  Neurological: She is alert and oriented to person, place, and time.       Mental status and motor strength appears baseline for patient and situation.  Skin: No rash noted.  Psychiatric:       Anxious, depressed, tearful, cooperative, positive SI with plan    ED Course  Procedures (including critical care time) ACT to see Pt in ED.2330 Labs Reviewed  COMPREHENSIVE METABOLIC PANEL - Abnormal; Notable for the following:    BUN 32 (*)    Creatinine, Ser 1.72 (*)    ALT 50 (*)    Total Bilirubin 0.2 (*)    GFR calc non Af Amer 32 (*)    GFR calc Af Amer 37 (*)    All other components within normal limits  URINE RAPID DRUG SCREEN (HOSP PERFORMED) - Abnormal; Notable for the following:    Benzodiazepines POSITIVE (*)    All other components within normal limits  CBC  ETHANOL  ACETAMINOPHEN LEVEL   No results found.   1. Depression   2. Substance abuse   3. Benzodiazepine dependence       MDM  Patient / Family / Caregiver understand and agree with initial ED impression and plan with expectations set for ED visit.        Babette Relic, MD 06/06/11 [phone]

## 2011-06-04 NOTE — ED Notes (Signed)
Patient reports suicidal ideation with depression and anxiety worsening over the last several  Weeks.   Pt. Is tearful and reports she has been taking two of her Xanax every three hours.  Spouse at bedside and MD in to see patient.

## 2011-06-04 NOTE — BH Assessment (Addendum)
Assessment Note   Denise Serrano is an 56 y.o. female. Pt reported to the [facility] with a chief complaint of wanting to detox from benzodiazepines (Xanax) and suicidal ideation with plan to OD. Pt states that she has been taking over the prescribed amount of Xanax for approximately 2 weeks. Pt states that she takes 1-2 0.5mg  Xanax pills every 3-4 hours when she is only supposed to take 4 daily. Pt states that today she has taken 5 0.5mg  Xanax pills today before 6pm. Pt states that she has had increased depression due to family issues. Pt states that she recently had a fight with her sister and is no longer on speaking terms, her daughter has a new boyfriend and is not talking to her as frequently and her brother is in jail and has not talked to her in over 1 month. Pt states that she feels she has too many medications for her Bipolar Disorder and feels "over medicated". Symptoms of depression include: despondency, fatigue, tearfulness, isolation, irritability and sleeping 16+ hours daily. Pt states that she is motivated for help, stating "I can't continue to go on like this, when I realized that I just kept taking more pills and wanted to go to bed, I knew I needed help." Pt states that she has had thoughts of SI for 2 weeks and has had one previous suicide attempt. Pt currently denies HI/AVH though endorses a hx of visual hallucinations. Pt requires inpatient hospitalization for stabilization.    Axis I: Bipolar, Depressed and (Benzodiazepine/Xanax) Substance Abuse Axis II: Deferred Axis III:  Past Medical History  Diagnosis Date  . Bipolar 1 disorder, mixed   . Impaired hearing hearing aid- bilateral  . Elevated blood pressure (not hypertension) borderline-  monitored  . Seasonal allergies   . Asthma   . Urgency of urination   . Nocturia   . OSA on CPAP   . Arthritis of knee, left   . Stress incontinence, female   . Cataract immature BILATERAL  . Chronic kidney disease (CKD), stage III (moderate)  MONITORED BY DR GOLDSBOROUGH   Axis IV: problems with primary support group Axis V: 31-40 impairment in reality testing  Past Medical History:  Past Medical History  Diagnosis Date  . Bipolar 1 disorder, mixed   . Impaired hearing hearing aid- bilateral  . Elevated blood pressure (not hypertension) borderline-  monitored  . Seasonal allergies   . Asthma   . Urgency of urination   . Nocturia   . OSA on CPAP   . Arthritis of knee, left   . Stress incontinence, female   . Cataract immature BILATERAL  . Chronic kidney disease (CKD), stage III (moderate) MONITORED BY DR Moshe Cipro    Past Surgical History  Procedure Date  . Tubal ligation 20 YRS AGO  . Dx laparoscopy for infertility 46 YRS AGO  . Bladder suspension 05/07/2011    Procedure: [place] PROCEDURE;  Surgeon: Reece Packer, MD;  Location: [facility];  Service: Urology;  Laterality: N/A;  cysto, sparc sling     Family History: No family history on file.  Social History:  reports that she has never smoked. She has never used smokeless tobacco. She reports that she drinks alcohol. She reports that she does not use illicit drugs.  Additional Social History:    Allergies: No Known Allergies  Home Medications:  Medications Prior to Admission  Medication Dose Route Frequency Provider Last Rate Last Dose  . ALPRAZolam (XANAX) tablet 0.5 mg  0.5 mg Oral TID PRN Babette Relic, MD      . buPROPion [facility] SR) 12 hr tablet 150 mg  150 mg Oral BID Babette Relic, MD   150 mg at 06/04/11 2316  . divalproex (DEPAKOTE ER) 24 hr tablet 1,000 mg  1,000 mg Oral QHS Babette Relic, MD   1,000 mg at 06/04/11 2316  . docusate sodium (COLACE) capsule 100 mg  100 mg Oral Daily Babette Relic, MD   100 mg at 06/04/11 2238  . levothyroxine (SYNTHROID, LEVOTHROID) tablet 50 mcg  50 mcg Oral Daily Babette Relic, MD      . mulitivitamin with minerals tablet 1 tablet  1 tablet Oral BID Babette Relic, MD   1 tablet at 06/04/11  2238  . olanzapine-fluoxetine (SYMBYAX) 3-25 MG per capsule 1 capsule  1 capsule Oral QPM Babette Relic, MD      . olanzapine-FLUoxetine [facility]) 6-25 MG per capsule 1 capsule  1 capsule Oral QHS Babette Relic, MD   1 capsule at 06/04/11 2316  . ziprasidone (GEODON) capsule 120 mg  120 mg Oral QHS Babette Relic, MD   120 mg at 06/04/11 2238  . DISCONTD: olanzapine-fluoxetine (SYMBYAX) 3-25 MG per capsule 1 capsule  1 capsule Oral QPM Babette Relic, MD      . DISCONTD: olanzapine-fluoxetine (SYMBYAX) 3-25 MG per capsule 1 capsule  1 capsule Oral QPM Babette Relic, MD       No current outpatient prescriptions on file as of 06/04/2011.    OB/GYN Status:  No LMP recorded. Patient is postmenopausal.  General Assessment Data Location of Assessment: [facility] ED Living Arrangements: Spouse/significant other Can pt return to current living arrangement?: Yes Admission Status: Voluntary Is patient capable of signing voluntary admission?: Yes Transfer from: [facility] Referral Source: Self/Family/Friend  Education Status Is patient currently in school?: No  Risk to self Suicidal Ideation: Yes-Currently Present Suicidal Intent: Yes-Currently Present Is patient at risk for suicide?: Yes Suicidal Plan?: Yes-Currently Present Specify Current Suicidal Plan: "take all of my pills" Access to Means: Yes Specify Access to Suicidal Means: pt has prescription pills What has been your use of drugs/alcohol within the last 12 months?: Xanax : 1-2 .5mg  pills eery 3-4 hours Last use: 06/04/11 5 .5 mg pills  Previous Attempts/Gestures: Yes How many times?: 1  Other Self Harm Risks: pt denies Triggers for Past Attempts: Family contact Intentional Self Injurious Behavior: None Family Suicide History: No Recent stressful life event(s): Conflict (Comment) (conflict w/ sister, issues with daughter and brother) Persecutory voices/beliefs?: No Depression: Yes Depression Symptoms:  Despondent;Tearfulness;Isolating;Fatigue;Loss of interest in usual pleasures Substance abuse history and/or treatment for substance abuse?: Yes Suicide prevention information given to non-admitted patients: Not applicable  Risk to Others Homicidal Ideation: No-Not Currently/Within Last 6 Months Thoughts of Harm to Others: No-Not Currently Present/Within Last 6 Months Current Homicidal Intent: No-Not Currently/Within Last 6 Months Current Homicidal Plan: No-Not Currently/Within Last 6 Months Access to Homicidal Means: No Identified Victim: none History of harm to others?: No Assessment of Violence: None Noted Violent Behavior Description: pt calm and cooperative during assessment Does patient have access to weapons?:  (Husband did not allow pt to answer "[place] my find out" ) Criminal Charges Pending?: No Does patient have a court date: No  Psychosis Hallucinations: None noted (none currently but hx of visual hallucinations) Delusions: None noted  Mental Status Report Appear/Hygiene: Disheveled Eye Contact: Good Motor Activity: Unremarkable Speech: Logical/coherent Level  of Consciousness: Quiet/awake;Alert Mood: Depressed;Sad Affect: Appropriate to circumstance;Depressed;Blunted (Tearful) Anxiety Level: Moderate Thought Processes: Coherent;Relevant Judgement: Impaired Orientation: Person;Place;Time;Situation Obsessive Compulsive Thoughts/Behaviors: None  Cognitive Functioning Concentration: Normal Memory: Remote Intact;Recent Intact IQ: Average Insight: Fair Impulse Control: Poor Appetite: Good Weight Loss: 0  Weight Gain: 84  (over 2 year period from medications) Sleep: No Change Total Hours of Sleep: 16  (pt sleeps over 16 hours daily) Vegetative Symptoms: Staying in bed  Prior Inpatient Therapy Prior Inpatient Therapy: Yes Prior Therapy Dates: 2009, 2011 Prior Therapy Facilty/Provider(s): [facility] [place]  Reason for Treatment: detox, depression SI  Prior  Outpatient Therapy Prior Outpatient Therapy: Yes Prior Therapy Dates: 2007-present Prior Therapy Facilty/Provider(s): [facility]- Dr. Leland Johns therapist Reason for Treatment: Bipolar disorder, depression                     Additional Information 1:1 In Past 12 Months?: No CIRT Risk: No Elopement Risk: No Does patient have medical clearance?: Yes     Disposition:  Disposition Disposition of Patient: Inpatient treatment program [facility]) Type of inpatient treatment program: Adult Patient referred to: Other (Comment) [facility])  On Site Evaluation by:   Reviewed with Physician:     Darlin Drop F 06/04/2011 11:19 PM

## 2011-06-05 NOTE — ED Provider Notes (Signed)
6:05 AM The patient was evaluated by [facility] psychiatry. Telephychiatrist deems the patient safe to send home at this time.  Wynetta Fines, MD 06/05/11 [id number]

## 2011-06-05 NOTE — Discharge Instructions (Signed)
Chemical Dependency Chemical dependency is an addiction to drugs or alcohol. It is characterized by the repeated behavior of seeking out and using drugs and alcohol despite harmful consequences to the health and safety of ones self and others.  RISK FACTORS There are certain situations or behaviors that increase a person's risk for chemical dependency. These include:  A family history of chemical dependency.   A history of mental health issues, including depression and anxiety.   A home environment where drugs and alcohol are easily available to you.   Drug or alcohol use at a young age.  SYMPTOMS  The following symptoms can indicate chemical dependency:  Inability to limit the use of drugs or alcohol.   Nausea, sweating, shakiness, and anxiety that occurs when alcohol or drugs are not being used.   An increase in amount of drugs or alcohol that is necessary to get drunk or high.  People who experience these symptoms can assess their use of drugs and alcohol by asking themselves the following questions:  Have you been told by friends or family that they are worried about your use of alcohol or drugs?   Do friends and family ever tell you about things you did while drinking alcohol or using drugs that you do not remember?   Do you lie about using alcohol or drugs or about the amounts you use?   Do you have difficulty completing daily tasks unless you use alcohol or drugs?   Is the level of your work or school performance lower because of your drug or alcohol use?   Do you get sick from using drugs or alcohol but keep using anyway?   Do you feel uncomfortable in social situations unless you use alcohol or drugs?   Do you use drugs or alcohol to help forget problems?  An answer of yes to any of these questions may indicate chemical dependency. Professional evaluation is suggested. Document Released: 03/03/2001 Document Revised: 02/26/2011 Document Reviewed: 05/15/2010 [facility]  Patient Information 2012 [place], [place].

## 2013-10-18 ENCOUNTER — Other Ambulatory Visit: Payer: Self-pay | Admitting: Family Medicine

## 2013-10-18 DIAGNOSIS — M545 Low back pain, unspecified: Secondary | ICD-10-CM

## 2013-10-18 DIAGNOSIS — M25552 Pain in left hip: Secondary | ICD-10-CM

## 2013-11-14 ENCOUNTER — Encounter: Payer: Self-pay | Admitting: Diagnostic Neuroimaging

## 2013-11-14 ENCOUNTER — Ambulatory Visit (INDEPENDENT_AMBULATORY_CARE_PROVIDER_SITE_OTHER): Payer: Medicare Other | Admitting: Diagnostic Neuroimaging

## 2013-11-14 VITALS — BP 138/85 | HR 98 | Temp 97.8°F | Ht 60.0 in | Wt 205.0 lb

## 2013-11-14 DIAGNOSIS — R292 Abnormal reflex: Secondary | ICD-10-CM

## 2013-11-14 DIAGNOSIS — E1129 Type 2 diabetes mellitus with other diabetic kidney complication: Secondary | ICD-10-CM

## 2013-11-14 DIAGNOSIS — N189 Chronic kidney disease, unspecified: Secondary | ICD-10-CM

## 2013-11-14 DIAGNOSIS — E1142 Type 2 diabetes mellitus with diabetic polyneuropathy: Secondary | ICD-10-CM

## 2013-11-14 DIAGNOSIS — G609 Hereditary and idiopathic neuropathy, unspecified: Secondary | ICD-10-CM

## 2013-11-14 DIAGNOSIS — R269 Unspecified abnormalities of gait and mobility: Secondary | ICD-10-CM

## 2013-11-14 DIAGNOSIS — E1122 Type 2 diabetes mellitus with diabetic chronic kidney disease: Secondary | ICD-10-CM

## 2013-11-14 DIAGNOSIS — E1149 Type 2 diabetes mellitus with other diabetic neurological complication: Secondary | ICD-10-CM

## 2013-11-14 NOTE — Patient Instructions (Signed)
I will check MRI neck and lab testing.  Continue gabapentin.

## 2013-11-14 NOTE — Progress Notes (Signed)
[facility]  PATIENT: Denise Serrano DOB: 06/13/1955  REFERRING CLINICIAN: Richter HISTORY FROM: patient  REASON FOR VISIT: new consult   HISTORICAL  CHIEF COMPLAINT:  Chief Complaint  Patient presents with  . Numbness    legs and arms    HISTORY OF PRESENT ILLNESS:   58 year old right-handed female with hypertension, diabetes, hypercholesterolemia, bipolar disorder, here for release of numbness in the arms and legs for past 2 months.  Patient has constant numbness in her feet and lower extremities for past 2 months. She also has some left hip pain and left thigh numbness. Patient has intermittent numbness in her hands and arms especially when she is holding objects in front of her. Patient reports history of chronic kidney disease. Also has diabetes and has been on medication for past 4-6 months. She's not sure of her last hemoglobin A1c. She reports a.m. sugars from 120-150.  She has some mild generalized weakness. No neck pain. She has some low back pain on the left side.  REVIEW OF SYSTEMS: Full 14 system review of systems performed and notable only for numbness weakness restless legs bipolar disorder joint pain constipation shortness of breath blurred vision easy bruising hearing loss.  ALLERGIES: No Known Allergies  HOME MEDICATIONS: Outpatient Prescriptions Prior to Visit  Medication Sig Dispense Refill  . ALPRAZolam (XANAX) 0.5 MG tablet Take 0.5 mg by mouth as needed. For anxiety.      Marland Kitchen aspirin EC 81 MG tablet Take 81 mg by mouth daily.      Marland Kitchen buPROPion (WELLBUTRIN SR) 150 MG 12 hr tablet Take 150 mg by mouth 2 (two) times daily.      Marland Kitchen levothyroxine (SYNTHROID, LEVOTHROID) 50 MCG tablet Take 50 mcg by mouth daily.      . Multiple Vitamin (MULITIVITAMIN WITH MINERALS) TABS Take 1 tablet by mouth 2 (two) times daily.      Marland Kitchen albuterol (PROVENTIL HFA;VENTOLIN HFA) 108 (90 BASE) MCG/ACT inhaler Inhale 2 puffs into the lungs every 6 (six) hours as  needed. For shortness of breath.      Marland Kitchen azelastine (ASTELIN) 137 MCG/SPRAY nasal spray Place 1 spray into the nose 2 (two) times daily as needed. Use in each nostril as directed; for allergies.      . calcium carbonate (OS-CAL) 600 MG TABS Take 600 mg by mouth daily.      . divalproex (DEPAKOTE ER) 500 MG 24 hr tablet Take 1,000 mg by mouth at bedtime.      . docusate sodium (COLACE) 100 MG capsule Take 100 mg by mouth daily.      Marland Kitchen olanzapine-fluoxetine (SYMBYAX) 3-25 MG per capsule Take 1 capsule by mouth every evening.      . Probiotic Product (PROBIOTIC PO) Take 1 capsule by mouth daily.      . valACYclovir (VALTREX) 500 MG tablet Take 500 mg by mouth daily as needed. For outbreaks.      . ziprasidone (GEODON) 60 MG capsule Take 120 mg by mouth at bedtime.       No facility-administered medications prior to visit.    PAST MEDICAL HISTORY: Past Medical History  Diagnosis Date  . Bipolar 1 disorder, mixed   . Impaired hearing hearing aid- bilateral  . Elevated blood pressure (not hypertension) borderline-  monitored  . Seasonal allergies   . Asthma   . Urgency of urination   . Nocturia   . OSA on CPAP   . Arthritis of knee, left   . Stress incontinence,  female   . Cataract immature BILATERAL  . Chronic kidney disease (CKD), stage III (moderate) MONITORED BY DR Moshe Cipro    PAST SURGICAL HISTORY: Past Surgical History  Procedure Laterality Date  . Tubal ligation  20 YRS AGO  . Dx laparoscopy for infertility  25 YRS AGO  . Bladder suspension  05/07/2011    Procedure: [place] PROCEDURE;  Surgeon: Reece Packer, MD;  Location: [facility];  Service: Urology;  Laterality: N/A;  cysto, sparc sling     FAMILY HISTORY: Family History  Problem Relation Age of Onset  . Heart attack Mother   . Heart attack Father     SOCIAL HISTORY:  History   Social History  . Marital Status: Divorced    Spouse Name: N/A    Number of Children: 2  . Years of Education:  2 yr degre   Occupational History  .  Other    disability   Social History Main Topics  . Smoking status: Never Smoker   . Smokeless tobacco: Never Used  . Alcohol Use: Yes     Comment: social  . Drug Use: No  . Sexual Activity: Not on file   Other Topics Concern  . Not on file   Social History Narrative   Patient lives at home with her spouse.   Caffeine use: very little     PHYSICAL EXAM  Filed Vitals:   11/14/13 1152  BP: 138/85  Pulse: 98  Temp: 97.8 F (36.6 C)  TempSrc: Oral  Height: 5' (1.524 m)  Weight: 205 lb (92.987 kg)    Not recorded    Body mass index is 40.04 kg/(m^2).  GENERAL EXAM: Patient is in no distress; well developed, nourished and groomed; neck is supple  CARDIOVASCULAR: Regular rate and rhythm, no murmurs, no carotid bruits  NEUROLOGIC: MENTAL STATUS: awake, alert, oriented to person, place and time, recent and remote memory intact, normal attention and concentration, language fluent, comprehension intact, naming intact, fund of knowledge appropriate CRANIAL NERVE: no papilledema on fundoscopic exam, pupils equal and reactive to light, visual fields full to confrontation, extraocular muscles intact, no nystagmus, facial sensation and strength symmetric, hearing intact, palate elevates symmetrically, uvula midline, shoulder shrug symmetric, tongue midline. MOTOR: normal bulk and tone, full strength in the BUE, BLE SENSORY: normal and symmetric to light touch, pinprick, temperature, vibration; EXCEPT DECR PP IN FEET COORDINATION: finger-nose-finger, fine finger movements normal REFLEXES: deep tendon reflexes BRISK and symmetric; BUE 3, KNEES 3, ANKLES 2 GAIT/STATION: narrow based gait; SLOW, CAREFUL, ANTALGIC GAIT. Romberg is negative    DIAGNOSTIC DATA (LABS, IMAGING, TESTING) - I reviewed patient records, labs, notes, testing and imaging myself where available.  Lab Results  Component Value Date   WBC 8.9 06/04/2011   HGB 13.8  06/04/2011   HCT 41.5 06/04/2011   MCV 97.4 06/04/2011   PLT 198 06/04/2011      Component Value Date/Time   NA 137 06/04/2011 2116   K 4.4 06/04/2011 2116   CL 99 06/04/2011 2116   CO2 29 06/04/2011 2116   GLUCOSE 99 06/04/2011 2116   BUN 32* 06/04/2011 2116   CREATININE 1.72* 06/04/2011 2116   CALCIUM 9.9 06/04/2011 2116   PROT 7.4 06/04/2011 2116   ALBUMIN 3.7 06/04/2011 2116   AST 32 06/04/2011 2116   ALT 50* 06/04/2011 2116   ALKPHOS 80 06/04/2011 2116   BILITOT 0.2* 06/04/2011 2116   GFRNONAA 32* 06/04/2011 2116   GFRAA 37* 06/04/2011 2116  No results found for this basename: CHOL, HDL, LDLCALC, LDLDIRECT, TRIG, CHOLHDL   No results found for this basename: HGBA1C   No results found for this basename: VITAMINB12   No results found for this basename: TSH      ASSESSMENT AND PLAN  58 y.o. year old female here with diabetes, chronic kidney disease, with intermittent opportunity numbness and constant lower extremity numbness. Findings most likely represent peripheral neuropathy, likely related to underlying diabetes and chronic kidney disease. Intermittent upper extremity symptoms may be related to intermittent compressive neuropathy. Exam notable for some hyperreflexia, so will look for CNS etiologies as well.  PLAN: - additional workup (neuropathy labs and MRI c-spine)  Orders Placed This Encounter  Procedures  . MR Cervical Spine Wo Contrast  . Neuropathy Panel  . Vitamin B12  . Hemoglobin A1c   Return in about 6 weeks (around 12/26/2013).    Penni Bombard, MD [id number], [id number] PM Certified in Neurology, Neurophysiology and Neuroimaging  [facility] [address] [place], [place] [address] [phone]

## 2013-11-15 DIAGNOSIS — R269 Unspecified abnormalities of gait and mobility: Secondary | ICD-10-CM | POA: Insufficient documentation

## 2013-11-16 LAB — NEUROPATHY PANEL
A/G Ratio: 0.9 (ref 0.7–2.0)
ALPHA 1: 0.2 g/dL (ref 0.1–0.4)
ANGIO CONVERT ENZYME: 16 U/L (ref 14–82)
Albumin ELP: 3.5 g/dL (ref 3.2–5.6)
Alpha 2: 1.1 g/dL (ref 0.4–1.2)
Anti Nuclear Antibody(ANA): NEGATIVE
Beta: 1.4 g/dL — ABNORMAL HIGH (ref 0.6–1.3)
GAMMA GLOBULIN: 1 g/dL (ref 0.5–1.6)
GLOBULIN, TOTAL: 3.7 g/dL (ref 2.0–4.5)
Sed Rate: 21 mm/hr (ref 0–40)
TSH: 1.13 u[IU]/mL (ref 0.450–4.500)
Total Protein: 7.2 g/dL (ref 6.0–8.5)
Vit D, 25-Hydroxy: 49.5 ng/mL (ref 30.0–100.0)

## 2013-11-16 LAB — HEMOGLOBIN A1C
ESTIMATED AVERAGE GLUCOSE: 148 mg/dL
HEMOGLOBIN A1C: 6.8 % — AB (ref 4.8–5.6)

## 2013-11-17 ENCOUNTER — Telehealth: Payer: Self-pay | Admitting: Diagnostic Neuroimaging

## 2013-11-17 NOTE — Telephone Encounter (Signed)
I called pt with lab results. A1c is 6.8. She is working on diabetes with her PCP. Will follow up after MRI cervical spine.   Penni Bombard, MD [id number], [id number] PM Certified in Neurology, Neurophysiology and Neuroimaging  [facility] [address] [place], [place] [address] [phone]

## 2013-11-20 ENCOUNTER — Ambulatory Visit
Admission: RE | Admit: 2013-11-20 | Discharge: 2013-11-20 | Disposition: A | Discharge status: Home / Self Care | Payer: Medicare Other | Source: Ambulatory Visit | Attending: Diagnostic Neuroimaging | Admitting: Diagnostic Neuroimaging

## 2013-11-20 DIAGNOSIS — R269 Unspecified abnormalities of gait and mobility: Secondary | ICD-10-CM

## 2013-11-20 DIAGNOSIS — E1142 Type 2 diabetes mellitus with diabetic polyneuropathy: Secondary | ICD-10-CM

## 2013-11-20 DIAGNOSIS — G609 Hereditary and idiopathic neuropathy, unspecified: Secondary | ICD-10-CM

## 2013-11-20 DIAGNOSIS — R292 Abnormal reflex: Secondary | ICD-10-CM

## 2013-12-07 ENCOUNTER — Telehealth: Payer: Self-pay | Admitting: Diagnostic Neuroimaging

## 2013-12-07 NOTE — Telephone Encounter (Signed)
Patient requesting earlier appt then 10/6 with Dr. Leta Baptist, due to Arms and Legs are very Numb/painful.  Also having issues with left hip and leg.  Please text any time due to hard of hearing.

## 2013-12-08 NOTE — Telephone Encounter (Signed)
Patient scheduled for 12-14-13 at 2pm.  Patient is aware.

## 2013-12-13 ENCOUNTER — Other Ambulatory Visit: Payer: Self-pay | Admitting: Orthopedic Surgery

## 2013-12-13 DIAGNOSIS — M48061 Spinal stenosis, lumbar region without neurogenic claudication: Secondary | ICD-10-CM

## 2013-12-13 DIAGNOSIS — M79605 Pain in left leg: Secondary | ICD-10-CM

## 2013-12-14 ENCOUNTER — Ambulatory Visit (INDEPENDENT_AMBULATORY_CARE_PROVIDER_SITE_OTHER): Payer: Medicare Other | Admitting: Diagnostic Neuroimaging

## 2013-12-14 ENCOUNTER — Encounter (INDEPENDENT_AMBULATORY_CARE_PROVIDER_SITE_OTHER): Payer: Self-pay

## 2013-12-14 ENCOUNTER — Encounter: Payer: Self-pay | Admitting: Diagnostic Neuroimaging

## 2013-12-14 VITALS — BP 105/64 | HR 95 | Ht 60.0 in | Wt 207.4 lb

## 2013-12-14 DIAGNOSIS — R209 Unspecified disturbances of skin sensation: Secondary | ICD-10-CM

## 2013-12-14 DIAGNOSIS — R202 Paresthesia of skin: Secondary | ICD-10-CM

## 2013-12-14 DIAGNOSIS — R200 Anesthesia of skin: Secondary | ICD-10-CM

## 2013-12-14 DIAGNOSIS — M79609 Pain in unspecified limb: Secondary | ICD-10-CM

## 2013-12-14 DIAGNOSIS — E1142 Type 2 diabetes mellitus with diabetic polyneuropathy: Secondary | ICD-10-CM

## 2013-12-14 DIAGNOSIS — E1149 Type 2 diabetes mellitus with other diabetic neurological complication: Secondary | ICD-10-CM

## 2013-12-14 DIAGNOSIS — M79641 Pain in right hand: Secondary | ICD-10-CM

## 2013-12-14 NOTE — Progress Notes (Signed)
[facility]  PATIENT: Denise Serrano DOB: 03/31/1955  REFERRING CLINICIAN:  HISTORY FROM: patient  REASON FOR VISIT: follow up   HISTORICAL  CHIEF COMPLAINT:  No chief complaint on file.   HISTORY OF PRESENT ILLNESS:   UPDATE 12/14/13: Since last visit, having more left hip pain (radiating to left leg) and right hand numbness. Bilateral foot pain/numbness stable. Test results reviewed.  PRIOR HPI (11/14/13): 58 year old right-handed female with hypertension, diabetes, hypercholesterolemia, bipolar disorder, here for release of numbness in the arms and legs for past 2 months. Patient has constant numbness in her feet and lower extremities for past 2 months. She also has some left hip pain and left thigh numbness. Patient has intermittent numbness in her hands and arms especially when she is holding objects in front of her. Patient reports history of chronic kidney disease. Also has diabetes and has been on medication for past 4-6 months. She's not sure of her last hemoglobin A1c. She reports a.m. sugars from 120-150. She has some mild generalized weakness. No neck pain. She has some low back pain on the left side.   REVIEW OF SYSTEMS: Full 14 system review of systems performed and notable only for hearing loss constipation RLS apnea freq urination numbness back pain walking diff bipolar.   ALLERGIES: No Known Allergies  HOME MEDICATIONS: Outpatient Prescriptions Prior to Visit  Medication Sig Dispense Refill  . ALPRAZolam (XANAX) 0.5 MG tablet Take 0.5 mg by mouth as needed. For anxiety.      Marland Kitchen aspirin EC 81 MG tablet Take 81 mg by mouth daily.      Marland Kitchen atorvastatin (LIPITOR) 20 MG tablet Take 20 mg by mouth daily.      Marland Kitchen buPROPion (WELLBUTRIN SR) 150 MG 12 hr tablet Take 150 mg by mouth 2 (two) times daily.      . cyclobenzaprine (FLEXERIL) 10 MG tablet Take 10 mg by mouth 3 times/day as needed-between meals & bedtime for muscle spasms.       Marland Kitchen gabapentin  (NEURONTIN) 300 MG capsule Take 300 mg by mouth at bedtime.      Marland Kitchen glimepiride (AMARYL) 2 MG tablet Take 2 mg by mouth daily. 1/2 tab in the am; 1/2 tab in the pm      . levothyroxine (SYNTHROID, LEVOTHROID) 50 MCG tablet Take 50 mcg by mouth daily.      Marland Kitchen lisinopril (PRINIVIL,ZESTRIL) 10 MG tablet Take 5 mg by mouth daily. 1/2 tab qd      . Multiple Vitamin (MULITIVITAMIN WITH MINERALS) TABS Take 1 tablet by mouth 2 (two) times daily.      . QUEtiapine (SEROQUEL) 400 MG tablet Take 400 mg by mouth at bedtime.      Marland Kitchen QUEtiapine (SEROQUEL) 50 MG tablet Take 50 mg by mouth at bedtime.      . ranitidine (ZANTAC) 300 MG capsule Take 300 mg by mouth every evening.      . sitaGLIPtin (JANUVIA) 25 MG tablet Take 25 mg by mouth daily.      Marland Kitchen acetaminophen-codeine (TYLENOL #3) 300-30 MG per tablet Take 1 tablet by mouth every 12 (twelve) hours.       No facility-administered medications prior to visit.    PAST MEDICAL HISTORY: Past Medical History  Diagnosis Date  . Bipolar 1 disorder, mixed   . Impaired hearing hearing aid- bilateral  . Elevated blood pressure (not hypertension) borderline-  monitored  . Seasonal allergies   . Asthma   . Urgency of urination   .  Nocturia   . OSA on CPAP   . Arthritis of knee, left   . Stress incontinence, female   . Cataract immature BILATERAL  . Chronic kidney disease (CKD), stage III (moderate) MONITORED BY DR Moshe Cipro    PAST SURGICAL HISTORY: Past Surgical History  Procedure Laterality Date  . Tubal ligation  20 YRS AGO  . Dx laparoscopy for infertility  32 YRS AGO  . Bladder suspension  05/07/2011    Procedure: [place] PROCEDURE;  Surgeon: Reece Packer, MD;  Location: [facility];  Service: Urology;  Laterality: N/A;  cysto, sparc sling     FAMILY HISTORY: Family History  Problem Relation Age of Onset  . Heart attack Mother   . Heart attack Father     SOCIAL HISTORY:  History   Social History  . Marital Status:  Divorced    Spouse Name: N/A    Number of Children: 2  . Years of Education: 2 yr degre   Occupational History  .  Other    disability   Social History Main Topics  . Smoking status: Never Smoker   . Smokeless tobacco: Never Used  . Alcohol Use: Yes     Comment: social  . Drug Use: No  . Sexual Activity: Not on file   Other Topics Concern  . Not on file   Social History Narrative   Patient lives at home with her spouse.   Caffeine use: very little     PHYSICAL EXAM  Filed Vitals:   12/14/13 1344  BP: 105/64  Pulse: 95  Height: 5' (1.524 m)  Weight: 207 lb 6.4 oz (94.076 kg)    Not recorded    Body mass index is 40.51 kg/(m^2).  GENERAL EXAM: Patient is in no distress; well developed, nourished and groomed; neck is supple  CARDIOVASCULAR: Regular rate and rhythm, no murmurs, no carotid bruits  NEUROLOGIC: MENTAL STATUS: awake, alert, language fluent, comprehension intact, naming intact, fund of knowledge appropriate CRANIAL NERVE: pupils equal and reactive to light, visual fields full to confrontation, extraocular muscles intact, no nystagmus, facial sensation and strength symmetric, hearing intact, palate elevates symmetrically, uvula midline, shoulder shrug symmetric, tongue midline. MOTOR: POSTURAL TREMOR IN BUE. Normal bulk and tone, full strength in the BUE, BLE SENSORY: normal and symmetric to light touch, pinprick, temperature, vibration; EXCEPT DECR PP IN FEET COORDINATION: finger-nose-finger, fine finger movements normal REFLEXES: deep tendon reflexes BRISK and symmetric; BUE 3, KNEES 3, ANKLES 2 GAIT/STATION: narrow based gait; SLOW, CAREFUL, ANTALGIC GAIT. Romberg is negative    DIAGNOSTIC DATA (LABS, IMAGING, TESTING) - I reviewed patient records, labs, notes, testing and imaging myself where available.  Lab Results  Component Value Date   WBC 8.9 06/04/2011   HGB 13.8 06/04/2011   HCT 41.5 06/04/2011   MCV 97.4 06/04/2011   PLT 198 06/04/2011        Component Value Date/Time   NA 137 06/04/2011 2116   K 4.4 06/04/2011 2116   CL 99 06/04/2011 2116   CO2 29 06/04/2011 2116   GLUCOSE 99 06/04/2011 2116   BUN 32* 06/04/2011 2116   CREATININE 1.72* 06/04/2011 2116   CALCIUM 9.9 06/04/2011 2116   PROT 7.2 11/14/2013 1306   PROT 7.4 06/04/2011 2116   ALBUMIN 3.7 06/04/2011 2116   AST 32 06/04/2011 2116   ALT 50* 06/04/2011 2116   ALKPHOS 80 06/04/2011 2116   BILITOT 0.2* 06/04/2011 2116   GFRNONAA 32* 06/04/2011 2116   GFRAA 37* 06/04/2011  2116   No results found for this basename: CHOL,  HDL,  LDLCALC,  LDLDIRECT,  TRIG,  CHOLHDL   Lab Results  Component Value Date   HGBA1C 6.8* 11/14/2013   Lab Results  Component Value Date   VITAMINB12 >1999* 11/14/2013   Lab Results  Component Value Date   TSH 1.130 11/14/2013   I reviewed images myself and agree with interpretation. -VRP  11/20/13 MRI cervical spine - mild degenerative disease of the cervical spine at C4-5 and C5-6 without central canal or foraminal stenosis. No finding to explain the patient's upper extremity symptoms.    ASSESSMENT AND PLAN  58 y.o. year old female here with diabetes, chronic kidney disease, with intermittent opportunity numbness and constant lower extremity numbness. Findings most likely represent peripheral neuropathy, likely related to underlying diabetes and chronic kidney disease. Intermittent upper extremity symptoms may be related to intermittent compressive neuropathy (carpal tunnel syndrome).    PLAN: - check EMG/NCS to eval for superimposed compressive neuropathy (on top of underlying polyneuropathy from diabetes and kidney dz) - increase gabapentin to 300mg  BID  Orders Placed This Encounter  Procedures  . NCV with EMG(electromyography)    Return for for NCV/EMG.    Penni Bombard, MD [id number], [id number] PM Certified in Neurology, Neurophysiology and Neuroimaging  [facility] [address], [place], [place]  [address] [phone]

## 2013-12-14 NOTE — Patient Instructions (Signed)
Check EMG/NCS (electrical nerve test).  Increase gabapentin to 300mg  twice a day.

## 2013-12-20 ENCOUNTER — Ambulatory Visit (INDEPENDENT_AMBULATORY_CARE_PROVIDER_SITE_OTHER): Payer: Medicare Other | Admitting: Diagnostic Neuroimaging

## 2013-12-20 ENCOUNTER — Encounter (INDEPENDENT_AMBULATORY_CARE_PROVIDER_SITE_OTHER): Payer: Self-pay

## 2013-12-20 DIAGNOSIS — R200 Anesthesia of skin: Secondary | ICD-10-CM

## 2013-12-20 DIAGNOSIS — R202 Paresthesia of skin: Secondary | ICD-10-CM

## 2013-12-20 DIAGNOSIS — G5603 Carpal tunnel syndrome, bilateral upper limbs: Secondary | ICD-10-CM

## 2013-12-20 DIAGNOSIS — M79641 Pain in right hand: Secondary | ICD-10-CM

## 2013-12-20 DIAGNOSIS — E1142 Type 2 diabetes mellitus with diabetic polyneuropathy: Secondary | ICD-10-CM

## 2013-12-20 DIAGNOSIS — Z0289 Encounter for other administrative examinations: Secondary | ICD-10-CM

## 2013-12-20 NOTE — Procedures (Signed)
   [facility]  NCS (NERVE CONDUCTION STUDY) WITH EMG (ELECTROMYOGRAPHY) REPORT   STUDY DATE: 12/20/13  PATIENT NAME: Denise Serrano DOB: 06/25/1955 MRN: [id number]  ORDERING CLINICIAN: Andrey Spearman, MD   TECHNOLOGIST: Laretta Alstrom  ELECTROMYOGRAPHER: Earlean Polka. Penumalli, MD  CLINICAL INFORMATION: 59 year old female with bilateral hand pain / numbness.  FINDINGS: NERVE CONDUCTION STUDY: Bilateral median motor responses have prolonged distal latencies (right 4.7 ms, left 5.0 ms), normal amplitudes, normal conduction velocities and normal F-wave latencies. Bilateral ulnar, bilateral peroneal, bilateral tibial motor responses and F-wave latencies are normal. Bilateral H reflex responses were normal.  Bilateral median sensory responses had normal amplitudes and slow conduction velocities (right 38 m/s, left 36 m/s).   Bilateral ulnar and peroneal sensory responses are normal.   NEEDLE ELECTROMYOGRAPHY: Needle examination of right upper extremity deltoid, biceps, triceps, flexor carpi radialis, first dorsal interosseous muscles is normal.  IMPRESSION:  Abnormal study demonstrating: 1. Bilateral median neuropathies at the wrist consistent with bilateral carpal tunnel syndrome. 2. No evidence of underlying widespread large fiber neuropathy.    INTERPRETING PHYSICIAN:  Penni Bombard, MD Certified in Neurology, Neurophysiology and Neuroimaging  [facility] [address], [place], [place] [address] [phone]

## 2013-12-25 ENCOUNTER — Ambulatory Visit
Admission: RE | Admit: 2013-12-25 | Discharge: 2013-12-25 | Disposition: A | Discharge status: Home / Self Care | Payer: Medicare Other | Source: Ambulatory Visit | Attending: Orthopedic Surgery | Admitting: Orthopedic Surgery

## 2013-12-25 ENCOUNTER — Other Ambulatory Visit: Payer: Self-pay | Admitting: Orthopedic Surgery

## 2013-12-25 ENCOUNTER — Ambulatory Visit
Admission: RE | Admit: 2013-12-25 | Discharge: 2013-12-25 | Disposition: A | Discharge status: Home / Self Care | Payer: Self-pay | Source: Ambulatory Visit | Attending: Orthopedic Surgery | Admitting: Orthopedic Surgery

## 2013-12-25 VITALS — BP 107/61 | HR 83

## 2013-12-25 DIAGNOSIS — R52 Pain, unspecified: Secondary | ICD-10-CM

## 2013-12-25 DIAGNOSIS — M48061 Spinal stenosis, lumbar region without neurogenic claudication: Secondary | ICD-10-CM

## 2013-12-25 DIAGNOSIS — M79605 Pain in left leg: Secondary | ICD-10-CM

## 2013-12-25 DIAGNOSIS — R269 Unspecified abnormalities of gait and mobility: Secondary | ICD-10-CM

## 2013-12-25 MED ORDER — IOHEXOL 180 MG/ML  SOLN
20.0000 mL | Freq: Once | INTRAMUSCULAR | Status: AC | PRN
  Administered 2013-12-25: 20 mL via INTRATHECAL

## 2013-12-25 MED ORDER — MORPHINE SULFATE 4 MG/ML IJ SOLN
8.0000 mg | Freq: Once | INTRAMUSCULAR | Status: AC
  Administered 2013-12-25: 8 mg via INTRAMUSCULAR

## 2013-12-25 MED ORDER — DIAZEPAM 5 MG PO TABS
10.0000 mg | ORAL_TABLET | Freq: Once | ORAL | Status: AC
  Administered 2013-12-25: 10 mg via ORAL

## 2013-12-25 NOTE — Progress Notes (Signed)
Patient states she has been off Bupropion for the past two days.  jkl

## 2013-12-25 NOTE — Discharge Instructions (Addendum)
Myelogram Discharge Instructions  1. Go home and rest quietly for the next 24 hours.  It is important to lie flat for the next 24 hours.  Get up only to go to the restroom.  You may lie in the bed or on a couch on your back, your stomach, your left side or your right side.  You may have one pillow under your head.  You may have pillows between your knees while you are on your side or under your knees while you are on your back.  2. DO NOT drive today.  Recline the seat as far back as it will go, while still wearing your seat belt, on the way home.  3. You may get up to go to the bathroom as needed.  You may sit up for 10 minutes to eat.  You may resume your normal diet and medications unless otherwise indicated.  Drink plenty of extra fluids today and tomorrow.  4. The incidence of a spinal headache with nausea and/or vomiting is about 5% (one in 20 patients).  If you develop a headache, lie flat and drink plenty of fluids until the headache goes away.  Caffeinated beverages may be helpful.  If you develop severe nausea and vomiting or a headache that does not go away with flat bed rest, call [phone].  5. You may resume normal activities after your 24 hours of bed rest is over; however, do not exert yourself strongly or do any heavy lifting tomorrow.  6. Call your physician for a follow-up appointment.   You may resume Bupropion on Tuesday, December 26, 2013 after 8:30a.m.

## 2013-12-26 ENCOUNTER — Ambulatory Visit: Payer: Medicare Other | Admitting: Diagnostic Neuroimaging

## 2014-01-03 ENCOUNTER — Encounter: Payer: Self-pay | Admitting: Diagnostic Neuroimaging

## 2014-01-03 ENCOUNTER — Ambulatory Visit (INDEPENDENT_AMBULATORY_CARE_PROVIDER_SITE_OTHER): Payer: Medicare Other | Admitting: Diagnostic Neuroimaging

## 2014-01-03 VITALS — BP 117/65 | HR 95 | Ht 60.0 in | Wt 206.8 lb

## 2014-01-03 DIAGNOSIS — R200 Anesthesia of skin: Secondary | ICD-10-CM

## 2014-01-03 DIAGNOSIS — R292 Abnormal reflex: Secondary | ICD-10-CM

## 2014-01-03 DIAGNOSIS — R269 Unspecified abnormalities of gait and mobility: Secondary | ICD-10-CM

## 2014-01-03 DIAGNOSIS — M5442 Lumbago with sciatica, left side: Secondary | ICD-10-CM

## 2014-01-03 DIAGNOSIS — E1142 Type 2 diabetes mellitus with diabetic polyneuropathy: Secondary | ICD-10-CM

## 2014-01-03 NOTE — Progress Notes (Signed)
[facility]  PATIENT: Denise Serrano DOB: 1956-01-09  REFERRING CLINICIAN:  HISTORY FROM: patient  REASON FOR VISIT: follow up   HISTORICAL  CHIEF COMPLAINT:  Chief Complaint  Patient presents with  . Follow-up    HISTORY OF PRESENT ILLNESS:   UPDATE 01/03/14: Now referred by Dr. Gladstone Lighter for left hip pain; MRI and CT lumbar spine show prominent epidural fat at L5-S1 level; no disc bulging/protrusions or bone spurring.   UPDATE 12/14/13: Since last visit, having more left hip pain (radiating to left leg) and right hand numbness. Bilateral foot pain/numbness stable. Test results reviewed.  PRIOR HPI (11/14/13): 58 year old right-handed female with hypertension, diabetes, hypercholesterolemia, bipolar disorder, here for release of numbness in the arms and legs for past 2 months. Patient has constant numbness in her feet and lower extremities for past 2 months. She also has some left hip pain and left thigh numbness. Patient has intermittent numbness in her hands and arms especially when she is holding objects in front of her. Patient reports history of chronic kidney disease. Also has diabetes and has been on medication for past 4-6 months. She's not sure of her last hemoglobin A1c. She reports a.m. sugars from 120-150. She has some mild generalized weakness. No neck pain. She has some low back pain on the left side.   REVIEW OF SYSTEMS: Full 14 system review of systems performed and notable only for hearing loss constipation RLS apnea freq urination numbness back pain walking diff bipolar.   ALLERGIES: No Known Allergies  HOME MEDICATIONS: Outpatient Prescriptions Prior to Visit  Medication Sig Dispense Refill  . ALPRAZolam (XANAX) 0.5 MG tablet Take 0.5 mg by mouth as needed. For anxiety.      Marland Kitchen aspirin EC 81 MG tablet Take 81 mg by mouth daily.      Marland Kitchen atorvastatin (LIPITOR) 20 MG tablet Take 20 mg by mouth daily.      Marland Kitchen buPROPion (WELLBUTRIN SR) 150 MG 12  hr tablet Take 150 mg by mouth every morning.       . cyclobenzaprine (FLEXERIL) 10 MG tablet Take 10 mg by mouth 3 times/day as needed-between meals & bedtime for muscle spasms.       Marland Kitchen gabapentin (NEURONTIN) 300 MG capsule Take 300 mg by mouth every morning.       Marland Kitchen glimepiride (AMARYL) 2 MG tablet Take 2 mg by mouth daily. 1/2 tab in the am; 1/2 tab in the pm      . HYDROcodone-acetaminophen (NORCO/VICODIN) 5-325 MG per tablet Take 1 tablet by mouth every 6 (six) hours as needed for moderate pain.      Marland Kitchen levothyroxine (SYNTHROID, LEVOTHROID) 50 MCG tablet Take 50 mcg by mouth daily.      Marland Kitchen lisinopril (PRINIVIL,ZESTRIL) 10 MG tablet Take 5 mg by mouth daily. 1/2 tab qd      . Multiple Vitamin (MULITIVITAMIN WITH MINERALS) TABS Take 1 tablet by mouth 2 (two) times daily.      . QUEtiapine (SEROQUEL) 400 MG tablet Take 400 mg by mouth at bedtime.      Marland Kitchen QUEtiapine (SEROQUEL) 50 MG tablet Take 50 mg by mouth at bedtime.      . ranitidine (ZANTAC) 300 MG capsule Take 300 mg by mouth every evening.      . sitaGLIPtin (JANUVIA) 25 MG tablet Take 25 mg by mouth daily.       No facility-administered medications prior to visit.    PAST MEDICAL HISTORY: Past Medical History  Diagnosis  Date  . Bipolar 1 disorder, mixed   . Impaired hearing hearing aid- bilateral  . Elevated blood pressure (not hypertension) borderline-  monitored  . Seasonal allergies   . Asthma   . Urgency of urination   . Nocturia   . OSA on CPAP   . Arthritis of knee, left   . Stress incontinence, female   . Cataract immature BILATERAL  . Chronic kidney disease (CKD), stage III (moderate) MONITORED BY DR Moshe Cipro    PAST SURGICAL HISTORY: Past Surgical History  Procedure Laterality Date  . Tubal ligation  20 YRS AGO  . Dx laparoscopy for infertility  66 YRS AGO  . Bladder suspension  05/07/2011    Procedure: [place] PROCEDURE;  Surgeon: Reece Packer, MD;  Location: [facility];  Service:  Urology;  Laterality: N/A;  cysto, sparc sling     FAMILY HISTORY: Family History  Problem Relation Age of Onset  . Heart attack Mother   . Heart attack Father     SOCIAL HISTORY:  History   Social History  . Marital Status: Divorced    Spouse Name: N/A    Number of Children: 2  . Years of Education: 2 yr degre   Occupational History  .  Other    disability   Social History Main Topics  . Smoking status: Never Smoker   . Smokeless tobacco: Never Used  . Alcohol Use: Yes     Comment: social  . Drug Use: No  . Sexual Activity: Not on file   Other Topics Concern  . Not on file   Social History Narrative   Patient lives at home with her spouse.   Caffeine use: very little     PHYSICAL EXAM  Filed Vitals:   01/03/14 1051  BP: 117/65  Pulse: 95  Height: 5' (1.524 m)  Weight: 206 lb 12.8 oz (93.804 kg)    Not recorded    Body mass index is 40.39 kg/(m^2).  GENERAL EXAM: Patient is in no distress; well developed, nourished and groomed; neck is supple; STRAIGHT LEG RAISE POSITIVE ON LEFT; FABER/FADIR NEG.   CARDIOVASCULAR: Regular rate and rhythm, no murmurs, no carotid bruits  NEUROLOGIC: MENTAL STATUS: awake, alert, language fluent, comprehension intact, naming intact, fund of knowledge appropriate CRANIAL NERVE: pupils equal and reactive to light, visual fields full to confrontation, extraocular muscles intact, no nystagmus, facial sensation and strength symmetric, hearing intact, palate elevates symmetrically, uvula midline, shoulder shrug symmetric, tongue midline. MOTOR: POSTURAL TREMOR IN BUE. Normal bulk and tone, full strength in the BUE, BLE SENSORY: normal and symmetric to light touch, pinprick, temperature, vibration; EXCEPT DECR PP IN FEET COORDINATION: finger-nose-finger, fine finger movements normal REFLEXES: deep tendon reflexes BRISK and symmetric; BUE 3, KNEES 3, ANKLES 2; DOWN GOING TOES. GAIT/STATION: narrow based gait; SLOW, CAREFUL,  ANTALGIC GAIT. Romberg is negative    DIAGNOSTIC DATA (LABS, IMAGING, TESTING) - I reviewed patient records, labs, notes, testing and imaging myself where available.  Lab Results  Component Value Date   WBC 8.9 06/04/2011   HGB 13.8 06/04/2011   HCT 41.5 06/04/2011   MCV 97.4 06/04/2011   PLT 198 06/04/2011      Component Value Date/Time   NA 137 06/04/2011 2116   K 4.4 06/04/2011 2116   CL 99 06/04/2011 2116   CO2 29 06/04/2011 2116   GLUCOSE 99 06/04/2011 2116   BUN 32* 06/04/2011 2116   CREATININE 1.72* 06/04/2011 2116   CALCIUM 9.9 06/04/2011 2116  PROT 7.2 11/14/2013 1306   PROT 7.4 06/04/2011 2116   ALBUMIN 3.7 06/04/2011 2116   AST 32 06/04/2011 2116   ALT 50* 06/04/2011 2116   ALKPHOS 80 06/04/2011 2116   BILITOT 0.2* 06/04/2011 2116   GFRNONAA 32* 06/04/2011 2116   GFRAA 37* 06/04/2011 2116   No results found for this basename: CHOL,  HDL,  LDLCALC,  LDLDIRECT,  TRIG,  CHOLHDL   Lab Results  Component Value Date   HGBA1C 6.8* 11/14/2013   Lab Results  Component Value Date   VITAMINB12 >1999* 11/14/2013   Lab Results  Component Value Date   TSH 1.130 11/14/2013   I reviewed images myself and agree with interpretation. -VRP  11/20/13 MRI cervical spine - mild degenerative disease of the cervical spine at C4-5 and C5-6 without central canal or foraminal stenosis. No finding to explain the patient's upper extremity symptoms.  12/02/13 MRI LUMBAR - significant epidural fatty tissue from L4-5 to L5-S1, with tapering of thecal sac.  12/25/13 CT lumbar myelogram - Prominent epidural fat at L5-S1. Otherwise, within normal limits.   ASSESSMENT AND PLAN  58 y.o. year old female here with diabetes, chronic kidney disease, with intermittent opportunity numbness and constant lower extremity numbness. Findings most likely represent peripheral neuropathy, likely related to underlying diabetes and chronic kidney disease. Intermittent upper extremity symptoms may be related to intermittent  compressive neuropathy (carpal tunnel syndrome). Left leg/hip pain could be lumbar radiculopathy from epidural fat and nerve compression vs diabetic lumbar radiculopathy. Also has unexplained hyper-reflexia of upper and lower extremities.   PLAN: - check EMG/NCS of BLE to eval for superimposed lumabr radiculopathy (on top of underlying polyneuropathy from diabetes and kidney dz) - MRI brain (eval unexplained hyper-reflexia) - labs   Orders Placed This Encounter  Procedures  . MR Brain Wo Contrast  . Ceruloplasmin  . Copper, Serum  . Panel [id number]  . RPR, Rfx Qn RPR/Confirm TP  . NCV with EMG(electromyography)   Return in about 3 months (around 04/05/2014).    Penni Bombard, MD [id number], [id number] AM Certified in Neurology, Neurophysiology and Neuroimaging  [facility] [address], [place], [place] [address] [phone]

## 2014-01-06 LAB — RNA QUALITATIVE: HIV 1 RNA Qualitative: NEGATIVE

## 2014-01-06 LAB — HIV-1/2 AB - DIFFERENTIATION
HIV 1 Ab: NONREACTIVE
HIV 2 AB: NONREACTIVE

## 2014-01-06 LAB — RPR QUALITATIVE: RPR: NONREACTIVE

## 2014-01-06 LAB — COPPER, SERUM: Copper: 113 ug/dL (ref 72–166)

## 2014-01-06 LAB — CERULOPLASMIN: CERULOPLASMIN: 29.6 mg/dL (ref 16.0–45.0)

## 2014-01-08 ENCOUNTER — Telehealth: Payer: Self-pay

## 2014-01-08 NOTE — Telephone Encounter (Addendum)
Spoke to patient. Gave lab results.

## 2014-01-08 NOTE — Telephone Encounter (Signed)
Message copied by Milta Deiters on Mon Jan 08, 2014  8:55 AM ------      Message from: Andrey Spearman      Created: Mon Jan 08, 2014  8:39 AM       Labs normal. Let patient know. -VRP ------

## 2014-01-10 ENCOUNTER — Telehealth: Payer: Self-pay | Admitting: Diagnostic Neuroimaging

## 2014-01-10 NOTE — Telephone Encounter (Signed)
Patient calling to state that she has not heard back regarding a referral to a hand specialist for her carpal tunnel, please return call and advise.

## 2014-01-11 ENCOUNTER — Telehealth: Payer: Self-pay | Admitting: Diagnostic Neuroimaging

## 2014-01-11 NOTE — Telephone Encounter (Signed)
Called patient to advise showing referral was faxed to Dr. Sheral Flow office, confirmation recvd. Patient was asleep.  Will provide patient with phone number to office #[phone] to check status of referral.

## 2014-01-11 NOTE — Telephone Encounter (Signed)
Dr Leta Baptist does not prescribe narcotic medications.  I called the patient back.  She is aware and will contact her PCP instead.

## 2014-01-11 NOTE — Telephone Encounter (Signed)
Patient calling to state that her pain management clinic is refusing to fill her pain medication, is requesting that Dr. Leta Baptist write her a script for hydrocodone, please return call and advise, states that she needs to pick it up tomorrow.

## 2014-01-16 NOTE — Telephone Encounter (Signed)
Called patient.  No answer.

## 2014-01-16 NOTE — Telephone Encounter (Signed)
Patient returning call to [facility], please call and advise.

## 2014-01-18 ENCOUNTER — Encounter (INDEPENDENT_AMBULATORY_CARE_PROVIDER_SITE_OTHER): Payer: Medicare Other | Admitting: Diagnostic Neuroimaging

## 2014-01-18 ENCOUNTER — Ambulatory Visit
Admission: RE | Admit: 2014-01-18 | Discharge: 2014-01-18 | Disposition: A | Discharge status: Home / Self Care | Payer: Medicare Other | Source: Ambulatory Visit | Attending: Family Medicine | Admitting: Family Medicine

## 2014-01-18 ENCOUNTER — Encounter (INDEPENDENT_AMBULATORY_CARE_PROVIDER_SITE_OTHER): Payer: Self-pay

## 2014-01-18 ENCOUNTER — Ambulatory Visit
Admission: RE | Admit: 2014-01-18 | Discharge: 2014-01-18 | Disposition: A | Discharge status: Home / Self Care | Payer: Medicare Other | Source: Ambulatory Visit | Attending: Diagnostic Neuroimaging | Admitting: Diagnostic Neuroimaging

## 2014-01-18 DIAGNOSIS — Z0289 Encounter for other administrative examinations: Secondary | ICD-10-CM

## 2014-01-18 DIAGNOSIS — R292 Abnormal reflex: Secondary | ICD-10-CM

## 2014-01-18 DIAGNOSIS — R269 Unspecified abnormalities of gait and mobility: Secondary | ICD-10-CM

## 2014-01-18 DIAGNOSIS — M5442 Lumbago with sciatica, left side: Secondary | ICD-10-CM

## 2014-01-18 DIAGNOSIS — E1142 Type 2 diabetes mellitus with diabetic polyneuropathy: Secondary | ICD-10-CM

## 2014-01-18 DIAGNOSIS — M545 Low back pain, unspecified: Secondary | ICD-10-CM

## 2014-01-18 DIAGNOSIS — R200 Anesthesia of skin: Secondary | ICD-10-CM

## 2014-01-18 DIAGNOSIS — M25552 Pain in left hip: Secondary | ICD-10-CM

## 2014-01-18 NOTE — Progress Notes (Signed)
This encounter was created in error - please disregard.

## 2014-01-18 NOTE — Telephone Encounter (Signed)
Returned patient's call. She said she will be in our office today for NCV/EMG and will get referral info. I called Dr. Brennan Bailey office (hand specialist) and they informed patient has an appointment on Tuesday (Nov. 3).

## 2014-04-04 ENCOUNTER — Ambulatory Visit: Payer: Medicare Other | Admitting: Diagnostic Neuroimaging

## 2014-05-03 ENCOUNTER — Inpatient Hospital Stay (HOSPITAL_COMMUNITY): Payer: Medicare Other

## 2014-05-03 ENCOUNTER — Inpatient Hospital Stay (HOSPITAL_COMMUNITY)
Admission: EM | Admit: 2014-05-03 | Discharge: 2014-05-06 | DRG: 683 | Disposition: A | Discharge status: Home / Self Care | Payer: Medicare Other | Attending: Internal Medicine | Admitting: Internal Medicine

## 2014-05-03 ENCOUNTER — Encounter (HOSPITAL_COMMUNITY): Payer: Self-pay | Admitting: Emergency Medicine

## 2014-05-03 DIAGNOSIS — N183 Chronic kidney disease, stage 3 (moderate): Secondary | ICD-10-CM | POA: Diagnosis present

## 2014-05-03 DIAGNOSIS — E1142 Type 2 diabetes mellitus with diabetic polyneuropathy: Secondary | ICD-10-CM | POA: Diagnosis present

## 2014-05-03 DIAGNOSIS — E872 Acidosis: Secondary | ICD-10-CM | POA: Diagnosis present

## 2014-05-03 DIAGNOSIS — E861 Hypovolemia: Secondary | ICD-10-CM | POA: Diagnosis present

## 2014-05-03 DIAGNOSIS — J45909 Unspecified asthma, uncomplicated: Secondary | ICD-10-CM | POA: Diagnosis present

## 2014-05-03 DIAGNOSIS — F3160 Bipolar disorder, current episode mixed, unspecified: Secondary | ICD-10-CM | POA: Diagnosis present

## 2014-05-03 DIAGNOSIS — E1122 Type 2 diabetes mellitus with diabetic chronic kidney disease: Secondary | ICD-10-CM | POA: Diagnosis present

## 2014-05-03 DIAGNOSIS — E860 Dehydration: Secondary | ICD-10-CM | POA: Diagnosis present

## 2014-05-03 DIAGNOSIS — F319 Bipolar disorder, unspecified: Secondary | ICD-10-CM | POA: Diagnosis present

## 2014-05-03 DIAGNOSIS — J302 Other seasonal allergic rhinitis: Secondary | ICD-10-CM | POA: Diagnosis present

## 2014-05-03 DIAGNOSIS — I129 Hypertensive chronic kidney disease with stage 1 through stage 4 chronic kidney disease, or unspecified chronic kidney disease: Secondary | ICD-10-CM | POA: Diagnosis present

## 2014-05-03 DIAGNOSIS — G4733 Obstructive sleep apnea (adult) (pediatric): Secondary | ICD-10-CM | POA: Diagnosis present

## 2014-05-03 DIAGNOSIS — N393 Stress incontinence (female) (male): Secondary | ICD-10-CM | POA: Diagnosis present

## 2014-05-03 DIAGNOSIS — Z7982 Long term (current) use of aspirin: Secondary | ICD-10-CM | POA: Diagnosis not present

## 2014-05-03 DIAGNOSIS — N179 Acute kidney failure, unspecified: Principal | ICD-10-CM | POA: Diagnosis present

## 2014-05-03 DIAGNOSIS — E039 Hypothyroidism, unspecified: Secondary | ICD-10-CM | POA: Diagnosis present

## 2014-05-03 DIAGNOSIS — M1712 Unilateral primary osteoarthritis, left knee: Secondary | ICD-10-CM | POA: Diagnosis present

## 2014-05-03 DIAGNOSIS — R197 Diarrhea, unspecified: Secondary | ICD-10-CM | POA: Diagnosis present

## 2014-05-03 DIAGNOSIS — E785 Hyperlipidemia, unspecified: Secondary | ICD-10-CM | POA: Diagnosis present

## 2014-05-03 DIAGNOSIS — H9190 Unspecified hearing loss, unspecified ear: Secondary | ICD-10-CM | POA: Diagnosis present

## 2014-05-03 DIAGNOSIS — I10 Essential (primary) hypertension: Secondary | ICD-10-CM | POA: Diagnosis present

## 2014-05-03 LAB — CBC
HCT: 31.6 % — ABNORMAL LOW (ref 36.0–46.0)
Hemoglobin: 10.2 g/dL — ABNORMAL LOW (ref 12.0–15.0)
MCH: 30.2 pg (ref 26.0–34.0)
MCHC: 32.3 g/dL (ref 30.0–36.0)
MCV: 93.5 fL (ref 78.0–100.0)
PLATELETS: 178 10*3/uL (ref 150–400)
RBC: 3.38 MIL/uL — AB (ref 3.87–5.11)
RDW: 14.3 % (ref 11.5–15.5)
WBC: 10 10*3/uL (ref 4.0–10.5)

## 2014-05-03 LAB — URINALYSIS, ROUTINE W REFLEX MICROSCOPIC
Glucose, UA: NEGATIVE mg/dL
HGB URINE DIPSTICK: NEGATIVE
KETONES UR: NEGATIVE mg/dL
Leukocytes, UA: NEGATIVE
Nitrite: NEGATIVE
Protein, ur: NEGATIVE mg/dL
SPECIFIC GRAVITY, URINE: 1.018 (ref 1.005–1.030)
UROBILINOGEN UA: 0.2 mg/dL (ref 0.0–1.0)
pH: 5 (ref 5.0–8.0)

## 2014-05-03 LAB — PROTEIN, URINE, RANDOM: TOTAL PROTEIN, URINE: UNDETERMINED mg/dL

## 2014-05-03 LAB — CBC WITH DIFFERENTIAL/PLATELET
Basophils Absolute: 0 10*3/uL (ref 0.0–0.1)
Basophils Relative: 0 % (ref 0–1)
EOS PCT: 5 % (ref 0–5)
Eosinophils Absolute: 0.4 10*3/uL (ref 0.0–0.7)
HEMATOCRIT: 37.7 % (ref 36.0–46.0)
HEMOGLOBIN: 12.2 g/dL (ref 12.0–15.0)
LYMPHS ABS: 2.2 10*3/uL (ref 0.7–4.0)
LYMPHS PCT: 22 % (ref 12–46)
MCH: 30.6 pg (ref 26.0–34.0)
MCHC: 32.4 g/dL (ref 30.0–36.0)
MCV: 94.5 fL (ref 78.0–100.0)
Monocytes Absolute: 0.9 10*3/uL (ref 0.1–1.0)
Monocytes Relative: 9 % (ref 3–12)
Neutro Abs: 6.3 10*3/uL (ref 1.7–7.7)
Neutrophils Relative %: 64 % (ref 43–77)
Platelets: 194 10*3/uL (ref 150–400)
RBC: 3.99 MIL/uL (ref 3.87–5.11)
RDW: 14.1 % (ref 11.5–15.5)
WBC: 9.8 10*3/uL (ref 4.0–10.5)

## 2014-05-03 LAB — COMPREHENSIVE METABOLIC PANEL
ALT: 26 U/L (ref 0–35)
AST: 15 U/L (ref 0–37)
Albumin: 3.9 g/dL (ref 3.5–5.2)
Alkaline Phosphatase: 133 U/L — ABNORMAL HIGH (ref 39–117)
Anion gap: 17 — ABNORMAL HIGH (ref 5–15)
BILIRUBIN TOTAL: 0.9 mg/dL (ref 0.3–1.2)
BUN: 93 mg/dL — ABNORMAL HIGH (ref 6–23)
CO2: 17 mmol/L — AB (ref 19–32)
CREATININE: 9.77 mg/dL — AB (ref 0.50–1.10)
Calcium: 9.1 mg/dL (ref 8.4–10.5)
Chloride: 103 mmol/L (ref 96–112)
GFR calc Af Amer: 4 mL/min — ABNORMAL LOW (ref >90)
GFR, EST NON AFRICAN AMERICAN: 4 mL/min — AB (ref >90)
Glucose, Bld: 127 mg/dL — ABNORMAL HIGH (ref 70–99)
Potassium: 4.5 mmol/L (ref 3.5–5.1)
Sodium: 137 mmol/L (ref 135–145)
Total Protein: 7.8 g/dL (ref 6.0–8.3)

## 2014-05-03 LAB — CREATININE, URINE, RANDOM: Creatinine, Urine: UNDETERMINED mg/dL

## 2014-05-03 LAB — SODIUM, URINE, RANDOM: SODIUM UR: UNDETERMINED mmol/L

## 2014-05-03 LAB — CREATININE, SERUM
CREATININE: 9.25 mg/dL — AB (ref 0.50–1.10)
GFR calc Af Amer: 5 mL/min — ABNORMAL LOW (ref >90)
GFR, EST NON AFRICAN AMERICAN: 4 mL/min — AB (ref >90)

## 2014-05-03 LAB — TSH: TSH: 0.696 u[IU]/mL (ref 0.350–4.500)

## 2014-05-03 LAB — GLUCOSE, CAPILLARY: Glucose-Capillary: 111 mg/dL — ABNORMAL HIGH (ref 70–99)

## 2014-05-03 MED ORDER — SODIUM BICARBONATE 8.4 % IV SOLN
INTRAVENOUS | Status: DC
  Administered 2014-05-03: via INTRAVENOUS
  Filled 2014-05-03 (×2): qty 150

## 2014-05-03 MED ORDER — HEPARIN SODIUM (PORCINE) 5000 UNIT/ML IJ SOLN
5000.0000 [IU] | Freq: Three times a day (TID) | INTRAMUSCULAR | Status: DC
  Administered 2014-05-03 – 2014-05-05 (×6): 5000 [IU] via SUBCUTANEOUS
  Filled 2014-05-03 (×9): qty 1

## 2014-05-03 MED ORDER — ASPIRIN EC 81 MG PO TBEC
81.0000 mg | DELAYED_RELEASE_TABLET | Freq: Every day | ORAL | Status: DC
  Administered 2014-05-04 – 2014-05-06 (×3): 81 mg via ORAL
  Filled 2014-05-03 (×3): qty 1

## 2014-05-03 MED ORDER — ONDANSETRON HCL 4 MG PO TABS: 4.0000 mg | ORAL_TABLET | Freq: Four times a day (QID) | ORAL | Status: DC | PRN

## 2014-05-03 MED ORDER — INSULIN ASPART 100 UNIT/ML ~~LOC~~ SOLN
0.0000 [IU] | Freq: Three times a day (TID) | SUBCUTANEOUS | Status: DC
Start: 2014-05-04 — End: 2014-05-06
  Administered 2014-05-05: 2 [IU] via SUBCUTANEOUS

## 2014-05-03 MED ORDER — SODIUM CHLORIDE 0.9 % IV BOLUS (SEPSIS): 1000.0000 mL | Freq: Once | INTRAVENOUS | Status: DC

## 2014-05-03 MED ORDER — SODIUM CHLORIDE 0.9 % IV SOLN: INTRAVENOUS | Status: DC

## 2014-05-03 MED ORDER — QUETIAPINE FUMARATE 400 MG PO TABS: 400.0000 mg | ORAL_TABLET | Freq: Every day | ORAL | Status: DC

## 2014-05-03 MED ORDER — ACETAMINOPHEN 325 MG PO TABS: 650.0000 mg | ORAL_TABLET | Freq: Four times a day (QID) | ORAL | Status: DC | PRN

## 2014-05-03 MED ORDER — ADULT MULTIVITAMIN W/MINERALS CH
1.0000 | ORAL_TABLET | Freq: Every day | ORAL | Status: DC
  Administered 2014-05-04 – 2014-05-06 (×3): 1 via ORAL
  Filled 2014-05-03 (×3): qty 1

## 2014-05-03 MED ORDER — SODIUM CHLORIDE 0.9 % IJ SOLN
3.0000 mL | Freq: Two times a day (BID) | INTRAMUSCULAR | Status: DC
  Administered 2014-05-03: 3 mL via INTRAVENOUS

## 2014-05-03 MED ORDER — PANTOPRAZOLE SODIUM 40 MG IV SOLR
40.0000 mg | INTRAVENOUS | Status: DC
  Administered 2014-05-03: 40 mg via INTRAVENOUS
  Filled 2014-05-03 (×2): qty 40

## 2014-05-03 MED ORDER — BUPROPION HCL ER (SR) 150 MG PO TB12
150.0000 mg | ORAL_TABLET | Freq: Every morning | ORAL | Status: DC
  Administered 2014-05-04: 150 mg via ORAL
  Filled 2014-05-03 (×2): qty 1

## 2014-05-03 MED ORDER — ZOLPIDEM TARTRATE 5 MG PO TABS
5.0000 mg | ORAL_TABLET | Freq: Every evening | ORAL | Status: DC | PRN
Start: 2014-05-03 — End: 2014-05-06

## 2014-05-03 MED ORDER — SODIUM CHLORIDE 0.9 % IV BOLUS (SEPSIS)
1000.0000 mL | Freq: Once | INTRAVENOUS | Status: AC
  Administered 2014-05-03: 1000 mL via INTRAVENOUS

## 2014-05-03 MED ORDER — QUETIAPINE FUMARATE ER 300 MG PO TB24
600.0000 mg | ORAL_TABLET | Freq: Every day | ORAL | Status: DC
  Administered 2014-05-03 – 2014-05-04 (×2): 600 mg via ORAL
  Filled 2014-05-03 (×3): qty 2

## 2014-05-03 MED ORDER — ONDANSETRON HCL 4 MG/2ML IJ SOLN: 4.0000 mg | Freq: Four times a day (QID) | INTRAMUSCULAR | Status: DC | PRN

## 2014-05-03 MED ORDER — MORPHINE SULFATE 2 MG/ML IJ SOLN: 2.0000 mg | INTRAMUSCULAR | Status: DC | PRN

## 2014-05-03 MED ORDER — HYDRALAZINE HCL 20 MG/ML IJ SOLN: 5.0000 mg | Freq: Three times a day (TID) | INTRAMUSCULAR | Status: DC | PRN

## 2014-05-03 MED ORDER — QUETIAPINE FUMARATE 50 MG PO TABS: 50.0000 mg | ORAL_TABLET | Freq: Every day | ORAL | Status: DC

## 2014-05-03 MED ORDER — LEVOTHYROXINE SODIUM 50 MCG PO TABS
50.0000 ug | ORAL_TABLET | Freq: Every day | ORAL | Status: DC
  Administered 2014-05-04 – 2014-05-06 (×3): 50 ug via ORAL
  Filled 2014-05-03 (×4): qty 1

## 2014-05-03 MED ORDER — HYDROCODONE-ACETAMINOPHEN 5-325 MG PO TABS: 1.0000 | ORAL_TABLET | Freq: Four times a day (QID) | ORAL | Status: DC | PRN

## 2014-05-03 MED ORDER — ALPRAZOLAM 0.5 MG PO TABS: 0.5000 mg | ORAL_TABLET | Freq: Three times a day (TID) | ORAL | Status: DC | PRN

## 2014-05-03 MED ORDER — ACETAMINOPHEN 650 MG RE SUPP: 650.0000 mg | Freq: Four times a day (QID) | RECTAL | Status: DC | PRN

## 2014-05-03 NOTE — H&P (Addendum)
Patient Demographics  Denise Serrano, is a 59 y.o. female  MRN: [id number]   DOB - 12/21/1955  Admit Date - 05/03/2014  Outpatient Primary MD for the patient is Hayden Rasmussen., MD   Assessment Denise Serrano is an extremely pleasant 59 year old female with diabetes mellitus type 2 complicated by CK D stage III and peripheral neuropathy/essential hypertension/hyperlipidemia/hypothyroidism/bipolar disorder who presented to the emergency department with generalized weakness associated with copious nonbloody diarrhea that started about a week ago after patient ate a salad while she was out with her friend and she is found to have acute kidney injury with BUN 93 creatinine 9.77, from baseline creatinine ?1.7(followed by Dr. 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 She denies any fever or chills since the diarrhea started and reports that she continued to take stool softeners(didn't think to hold them in spite of the diarrhea). Her blood pressure is borderline low around [id number] systolic today. White count is normal, UA is pending, so is chest x-ray. Patient was taking lisinopril/Januvia/Amaryl/Neurontin/Flexeril/Zantac among other medications. Likely, she developed dehydration related to diarrhea which is likely infectious in etiology(bacterial/viral?), in setting of ACE inhibitor/hypotension which accelerated acute kidney injury. Hopefully, this is temporary. Patient will be admitted to telemetry at [facility] for further investigations and management which would include urine lites/UA/urine culture/chest x-ray/renal ultrasound/CT abdomen and pelvis with oral contrast/stool studies including stool culture(?E. Coli- ate salad the night diarrhea started)/C. difficile PCR(now recent usage of antibiotics or sick contacts-therefore C difficile unlikely)/CPK. Will hold potential nephrotoxic medications/stool softners.  Will consult nephrology. Meanwhile, will give IV fluids and insert Foley catheter for input and output monitoring. Will not give antibiotics for now as no clear evidence of active infection. Plan AKI (acute kidney injury)/Diarrhea of presumed infectious origin  Admit to telemetry  Urine lites/UA/urine culture/stool studies/renal ultrasound/CT abdomen and pelvis/chest x-ray  Hold nephrotoxic medications  IV fluids  Place Foley catheter   Input and output monitoring  Enteric precautions  Consult nephrology Type 2 diabetes mellitus with diabetic chronic kidney disease/Diabetic polyneuropathy associated with type 2 diabetes mellitus  Hold oral hypoglycemics/Neurontin  SSI sensitive scale  Check hemoglobin A1c Bipolar disorder  Appears stable  Resume home medications Essential hypertension  Blood pressure low  Hold lisinopril  Hydralazine as needed for SBP greater than 170 mmHg Hypothyroidism/ Hyperlipidemia  Check TSH/lipid panel  Hold Lipitor  Continue Synthroid DVT/GI Prophylaxis  Discontinue ranitidine  Heparin subcutaneous  Protonix  Chief Complaint Chief Complaint  Patient presents with  . Diarrhea     HPI Denise Serrano  is a 59 y.o. female who presents with diarrhea that started on Friday after she went out to eat with a friend. She had some salad the same day and started having diarrhea up to 9 times a day. There was no blood in stool. She had some gas in the the stomach but no pain. She denies any nausea vomiting or dysuria or cough. There are no sick contacts. She continued to take stool softener. Denies recent usage of antibiotics. Denies any fever or chills.  Family Communication: Admission, patients condition and plan of care including tests being ordered have been discussed with the patient  who indicates understanding and agree with the plan and Code Status.  Code Status  Full code.  Likely DC to  Home  Condition GUARDED    Time spent  in minutes : 55  Review of Systems  As in the HPI above,   Past Medical History  Diagnosis Date  . Bipolar 1 disorder, mixed   . Impaired hearing hearing aid- bilateral  . Elevated blood pressure (not hypertension) borderline-  monitored  . Seasonal allergies   . Asthma   . Urgency of urination   . Nocturia   . OSA on CPAP   . Arthritis of knee, left   . Stress incontinence, female   . Cataract immature BILATERAL  . Chronic kidney disease (CKD), stage III (moderate) MONITORED BY DR Moshe Cipro      Past Surgical History  Procedure Laterality Date  . Tubal ligation  20 YRS AGO  . Dx laparoscopy for infertility  65 YRS AGO  . Bladder suspension  05/07/2011    Procedure: [place] PROCEDURE;  Surgeon: Reece Packer, MD;  Location: [facility];  Service: Urology;  Laterality: N/A;  cysto, sparc sling     Social History History  Substance Use Topics  . Smoking status: Never Smoker   . Smokeless tobacco: Never Used  . Alcohol Use: Yes     Comment: social    Family History Family History  Problem Relation Age of Onset  . Heart attack Mother   . Heart attack Father     Prior to Admission medications   Medication Sig Start Date End Date Taking? Authorizing Provider  ALPRAZolam Duanne Moron) 0.5 MG tablet Take 0.5 mg by mouth as needed. For anxiety.   Yes Historical Provider, MD  aspirin EC 81 MG tablet Take 81 mg by mouth daily.    Historical Provider, MD  atorvastatin (LIPITOR) 20 MG tablet Take 20 mg by mouth daily.    Historical Provider, MD  buPROPion (WELLBUTRIN SR) 150 MG 12 hr tablet Take 150 mg by mouth every morning.     Historical Provider, MD  cyclobenzaprine (FLEXERIL) 10 MG tablet Take 10 mg by mouth 3 times/day as needed-between meals & bedtime for muscle spasms.     Historical Provider, MD  gabapentin (NEURONTIN) 300 MG capsule Take 300 mg by mouth every morning.     Historical Provider, MD  glimepiride (AMARYL) 2 MG tablet Take 2 mg by mouth  daily. 1/2 tab in the am; 1/2 tab in the pm    Historical Provider, MD  HYDROcodone-acetaminophen (NORCO/VICODIN) 5-325 MG per tablet Take 1 tablet by mouth every 6 (six) hours as needed for moderate pain.    Historical Provider, MD  levothyroxine (SYNTHROID, LEVOTHROID) 50 MCG tablet Take 50 mcg by mouth daily.    Historical Provider, MD  lisinopril (PRINIVIL,ZESTRIL) 10 MG tablet Take 5 mg by mouth daily. 1/2 tab qd    Historical Provider, MD  Multiple Vitamin (MULITIVITAMIN WITH MINERALS) TABS Take 1 tablet by mouth 2 (two) times daily.    Historical Provider, MD  QUEtiapine (SEROQUEL) 400 MG tablet Take 400 mg by mouth at bedtime.    Historical Provider, MD  QUEtiapine (SEROQUEL) 50 MG tablet Take 50 mg by mouth at bedtime.    Historical Provider, MD  ranitidine (ZANTAC) 300 MG capsule Take 300 mg by mouth every evening.    Historical Provider, MD  sitaGLIPtin (JANUVIA) 25 MG tablet Take 25 mg by mouth daily.    Historical Provider, MD    Allergies  Allergen Reactions  . Amoxicillin Diarrhea    Physical Exam  Vitals  Blood pressure 102/54, pulse 95, temperature 97.4 F (36.3 C), temperature source Oral, resp. rate 16, SpO2 97 %.   1. General: lying in  bed in NAD.  2. Normal affect and insight, Not Suicidal or Homicidal, Awake Alert, Oriented X 3.  3. No F.N deficits, ALL C.Nerves Intact, Strength 5/5 all 4 extremities, Sensation intact all 4 extremities, Plantars down going.  4. Ears and Eyes appear Normal, Conjunctivae clear, PERRLA. Moist Oral Mucosa.  5. Supple Neck, No JVD, No cervical lymphadenopathy appriciated, No Carotid Bruits.  6. Symmetrical Chest wall movement, Good air movement bilaterally, CTAB.  7. RRR, No Gallops, Rubs or Murmurs, No Parasternal Heave.  8. Positive Bowel Sounds, Abdomen Soft, Non tender, No organomegaly appriciated,No rebound -guarding or rigidity.  9.  No Cyanosis, Normal Skin Turgor, No Skin Rash or Bruise.  10. Good muscle tone,   joints appear normal , no effusions, Normal ROM.  11. No Palpable Lymph Nodes in Neck or Axillae  Data Review  CBC  Recent Labs Lab 05/03/14 1443  WBC 9.8  HGB 12.2  HCT 37.7  PLT 194  MCV 94.5  MCH 30.6  MCHC 32.4  RDW 14.1  LYMPHSABS 2.2  MONOABS 0.9  EOSABS 0.4  BASOSABS 0.0   ------------------------------------------------------------------------------------------------------------------  Chemistries   Recent Labs Lab 05/03/14 1443  NA 137  K 4.5  CL 103  CO2 17*  GLUCOSE 127*  BUN 93*  CREATININE 9.77*  CALCIUM 9.1  AST 15  ALT 26  ALKPHOS 133*  BILITOT 0.9   ------------------------------------------------------------------------------------------------------------------ CrCl cannot be calculated (Unknown ideal weight.). ------------------------------------------------------------------------------------------------------------------ No results for input(s): TSH, T4TOTAL, T3FREE, THYROIDAB in the last 72 hours.  Invalid input(s): FREET3   Coagulation profile No results for input(s): INR, PROTIME in the last 168 hours. ------------------------------------------------------------------------------------------------------------------- No results for input(s): DDIMER in the last 72 hours. -------------------------------------------------------------------------------------------------------------------  Cardiac Enzymes No results for input(s): CKMB, TROPONINI, MYOGLOBIN in the last 168 hours.  Invalid input(s): CK ------------------------------------------------------------------------------------------------------------------ Invalid input(s): POCBNP   ---------------------------------------------------------------------------------------------------------------  Urinalysis No results found for: COLORURINE, APPEARANCEUR, LABSPEC, PHURINE, GLUCOSEU, HGBUR, BILIRUBINUR, KETONESUR, PROTEINUR, UROBILINOGEN, NITRITE,  LEUKOCYTESUR  ----------------------------------------------------------------------------------------------------------------  Imaging results:   Dg Chest Port 1 View  05/03/2014   CLINICAL DATA:  Diarrhea for 6 days. Decreased urine output. Dehydration. Chronic renal disease. Nonsmoker.  EXAM: PORTABLE CHEST - 1 VIEW  COMPARISON:  06/28/2009  FINDINGS: Shallow inspiration. Heart size and pulmonary vascularity are normal for technique. No focal airspace disease or consolidation in the lungs. No blunting of costophrenic angles. No pneumothorax. Mediastinal contours appear intact.  IMPRESSION: No active disease.   Electronically Signed   By: Lucienne Capers M.D.   On: 05/03/2014 17:09        Savilla Turbyfill M.D on 05/03/2014 at 5:12 PM  Between 7am to 7pm - Pager - [phone]  After 7pm go to [email or web address] - password [email or web address]  And look for the night coverage person covering me after hours  [facility] Office  [phone]

## 2014-05-03 NOTE — ED Notes (Signed)
Hearing aids x 2 placed in a urine cup with name label and then placed in belongings bag with clothes.

## 2014-05-03 NOTE — ED Notes (Signed)
[facility] called for transportation to "[facility]

## 2014-05-03 NOTE — ED Notes (Signed)
[facility] called for transport to [facility]

## 2014-05-03 NOTE — Consult Note (Signed)
Reason for Consult: Acute renal failure on chronic kidney disease stage III Referring Physician: Nat Math MD [facility] [place])  HPI:  59 year old Caucasian woman with past medical history significant for bipolar disorder previously on lithium, hypothyroidism, hypertension and chronic kidney disease stage III (baseline creatinine ranging 1.8 to 2.0).  She presented to the emergency room with a 6 day history of diarrhea, nausea with decreased oral intake but without vomiting. She denies any hematochezia or melena. Reports that she continued to take lisinopril 5 mg daily over this duration. She is currently hypotensive and being transferred to [facility].  Denies any dysuria, urgency or frequency and reports that she feels that her urinary quantity has decreased as her oral intake has diminished. Denies use of any over-the-counter nonsteroidal anti-inflammatory drugs. She has not had any intravenous contrast exposure.  On 10/06/13-her creatinine was 2.2.  Past Medical History  Diagnosis Date  . Bipolar 1 disorder, mixed   . Impaired hearing hearing aid- bilateral  . Elevated blood pressure (not hypertension) borderline-  monitored  . Seasonal allergies   . Asthma   . Urgency of urination   . Nocturia   . OSA on CPAP   . Arthritis of knee, left   . Stress incontinence, female   . Cataract immature BILATERAL  . Chronic kidney disease (CKD), stage III (moderate) MONITORED BY DR Moshe Cipro    Past Surgical History  Procedure Laterality Date  . Tubal ligation  20 YRS AGO  . Dx laparoscopy for infertility  45 YRS AGO  . Bladder suspension  05/07/2011    Procedure: [place] PROCEDURE;  Surgeon: Reece Packer, MD;  Location: [facility];  Service: Urology;  Laterality: N/A;  cysto, sparc sling     Family History  Problem Relation Age of Onset  . Heart attack Mother   . Heart attack Father     Social History:  reports that she has never smoked. She has never used smokeless  tobacco. She reports that she drinks alcohol. She reports that she does not use illicit drugs.  Allergies:  Allergies  Allergen Reactions  . Amoxicillin Diarrhea    Medications: Scheduled:   Results for orders placed or performed during the hospital encounter of 05/03/14 (from the past 48 hour(s))  CBC with Differential     Status: None   Collection Time: 05/03/14  2:43 PM  Result Value Ref Range   WBC 9.8 4.0 - 10.5 K/uL   RBC 3.99 3.87 - 5.11 MIL/uL   Hemoglobin 12.2 12.0 - 15.0 g/dL   HCT 37.7 36.0 - 46.0 %   MCV 94.5 78.0 - 100.0 fL   MCH 30.6 26.0 - 34.0 pg   MCHC 32.4 30.0 - 36.0 g/dL   RDW 14.1 11.5 - 15.5 %   Platelets 194 150 - 400 K/uL   Neutrophils Relative % 64 43 - 77 %   Neutro Abs 6.3 1.7 - 7.7 K/uL   Lymphocytes Relative 22 12 - 46 %   Lymphs Abs 2.2 0.7 - 4.0 K/uL   Monocytes Relative 9 3 - 12 %   Monocytes Absolute 0.9 0.1 - 1.0 K/uL   Eosinophils Relative 5 0 - 5 %   Eosinophils Absolute 0.4 0.0 - 0.7 K/uL   Basophils Relative 0 0 - 1 %   Basophils Absolute 0.0 0.0 - 0.1 K/uL  Comprehensive metabolic panel     Status: Abnormal   Collection Time: 05/03/14  2:43 PM  Result Value Ref Range   Sodium  137 135 - 145 mmol/L   Potassium 4.5 3.5 - 5.1 mmol/L   Chloride 103 96 - 112 mmol/L   CO2 17 (L) 19 - 32 mmol/L   Glucose, Bld 127 (H) 70 - 99 mg/dL   BUN 93 (H) 6 - 23 mg/dL   Creatinine, Ser 9.77 (H) 0.50 - 1.10 mg/dL   Calcium 9.1 8.4 - 10.5 mg/dL   Total Protein 7.8 6.0 - 8.3 g/dL   Albumin 3.9 3.5 - 5.2 g/dL   AST 15 0 - 37 U/L   ALT 26 0 - 35 U/L   Alkaline Phosphatase 133 (H) 39 - 117 U/L   Total Bilirubin 0.9 0.3 - 1.2 mg/dL   GFR calc non Af Amer 4 (L) >90 mL/min   GFR calc Af Amer 4 (L) >90 mL/min    Comment: (NOTE) The eGFR has been calculated using the CKD EPI equation. This calculation has not been validated in all clinical situations. eGFR's persistently <90 mL/min signify possible Chronic Kidney Disease.    Anion gap 17 (H) 5 - 15   Urinalysis, Routine w reflex microscopic     Status: Abnormal   Collection Time: 05/03/14  5:39 PM  Result Value Ref Range   Color, Urine AMBER (A) YELLOW    Comment: BIOCHEMICALS MAY BE AFFECTED BY COLOR   APPearance CLEAR CLEAR   Specific Gravity, Urine 1.018 1.005 - 1.030   pH 5.0 5.0 - 8.0   Glucose, UA NEGATIVE NEGATIVE mg/dL   Hgb urine dipstick NEGATIVE NEGATIVE   Bilirubin Urine MODERATE (A) NEGATIVE   Ketones, ur NEGATIVE NEGATIVE mg/dL   Protein, ur NEGATIVE NEGATIVE mg/dL   Urobilinogen, UA 0.2 0.0 - 1.0 mg/dL   Nitrite NEGATIVE NEGATIVE   Leukocytes, UA NEGATIVE NEGATIVE    Comment: MICROSCOPIC NOT DONE ON URINES WITH NEGATIVE PROTEIN, BLOOD, LEUKOCYTES, NITRITE, OR GLUCOSE <1000 mg/dL.  Creatinine, urine, random     Status: None   Collection Time: 05/03/14  5:39 PM  Result Value Ref Range   Creatinine, Urine 266.07 mg/dL  Sodium, urine, random     Status: None   Collection Time: 05/03/14  5:39 PM  Result Value Ref Range   Sodium, Ur 24 mmol/L  Protein, urine, random     Status: None   Collection Time: 05/03/14  5:39 PM  Result Value Ref Range   Total Protein, Urine 29 mg/dL    Comment: NO NORMAL RANGE ESTABLISHED FOR THIS TEST    Dg Chest Port 1 View  05/03/2014   CLINICAL DATA:  Diarrhea for 6 days. Decreased urine output. Dehydration. Chronic renal disease. Nonsmoker.  EXAM: PORTABLE CHEST - 1 VIEW  COMPARISON:  06/28/2009  FINDINGS: Shallow inspiration. Heart size and pulmonary vascularity are normal for technique. No focal airspace disease or consolidation in the lungs. No blunting of costophrenic angles. No pneumothorax. Mediastinal contours appear intact.  IMPRESSION: No active disease.   Electronically Signed   By: Lucienne Capers M.D.   On: 05/03/2014 17:09    Review of Systems  Constitutional: Positive for malaise/fatigue. Negative for fever and chills.  HENT: Positive for hearing loss. Negative for ear pain, nosebleeds and tinnitus.   Eyes: Negative.    Respiratory: Negative.   Cardiovascular: Negative.   Gastrointestinal: Positive for nausea, abdominal pain and diarrhea. Negative for vomiting and blood in stool.       Epigastric discomfort with bloating  Genitourinary: Negative.   Musculoskeletal: Negative.   Skin: Negative.   Neurological: Negative.  Negative for  headaches.   Blood pressure 95/53, pulse 83, temperature 98 F (36.7 C), temperature source Oral, resp. rate 19, SpO2 96 %. Physical Exam  Nursing note and vitals reviewed. Constitutional: She is oriented to person, place, and time. She appears well-developed and well-nourished. No distress.  Hard of hearing  HENT:  Head: Normocephalic and atraumatic.  Nose: Nose normal.  Mouth/Throat: No oropharyngeal exudate.  Oral mucosa/oropharynx appear to be dry  Eyes: Conjunctivae and EOM are normal. Pupils are equal, round, and reactive to light. No scleral icterus.  Neck: Normal range of motion. Neck supple. No JVD present. No thyromegaly present.  JV flat  Cardiovascular: Normal rate, regular rhythm and normal heart sounds.  Exam reveals no friction rub.   No murmur heard. Respiratory: Effort normal and breath sounds normal. No respiratory distress. She has no wheezes.  GI: Soft. Bowel sounds are normal. There is tenderness. There is no rebound and no guarding.  Mild epigastric tenderness  Musculoskeletal: Normal range of motion. She exhibits no edema.  Neurological: She is alert and oriented to person, place, and time. No cranial nerve deficit.  Skin: Skin is warm and dry. No erythema.    Assessment/Plan: 1. Acute renal failure on chronic kidney disease stage III: Based on the history, timeline of events and the available data base including a fractional excretion of sodium of less than 1% with a highly concentrated urine and an inactive sediment-this is likely acute renal failure from volume contraction due to her diarrhea with ongoing ACE inhibitor therapy. Agree with  intravenous fluid therapy and monitoring of input/output as we avoid nephrotoxins including nonsteroidals/intravenous contrast. At this time, there is no utility to repeat a renal ultrasound. I would favor giving her at least 1 L of isotonic sodium bicarbonate to help replenish losses from recent diarrhea. 2. Hypotension: Anticipated to improve with temporary cessation of ACE inhibitor and intravenous fluid therapy. 3. Mixed anion gap/non-anion gap metabolic acidosis: Secondary to acute renal failure and diarrhea losses, will give 150 mEq sodium bicarbonate over the next 10 hours. 4. Diarrhea: Workup/management per primary service.   Zani Kyllonen K. 05/03/2014, 6:35 PM

## 2014-05-03 NOTE — Progress Notes (Signed)
Pt admitted to the unit via [place]. Pt is alert and oriented. Pt oriented to room, staff, and call bell. Educated on fall safety plan. Bed in lowest position. Full assessment to Epic. Call bell with in reach. Educated to call for assists. Will continue to monitor. Mady Gemma, RN

## 2014-05-03 NOTE — ED Provider Notes (Signed)
CSN: [id number]     Arrival date & time 05/03/14  1424 History   First MD Initiated Contact with Patient 05/03/14 1533     Chief Complaint  Patient presents with  . Diarrhea     (Consider location/radiation/quality/duration/timing/severity/associated sxs/prior Treatment) HPI  59 year old female presents for evaluation of diarrhea. This diarrhea is been gone for 6 days. She has 6-8 loose, watery bowel movements per day. No sick contacts. Originally she thought it was from something she ate but it still has continued. She's felt somewhat nauseated but had no vomiting. No abdominal pain or cramping. No fevers or chills. Has not been around anyone that has been sick. Has not recently had any antibiotics or any recent travel. The patient states that her urine has been decreased, urinating once every 12 hours or so. She last urinated 8 hours ago. The patient has a prior history of CK D there was originally attributed to lithium about 10 years ago. She has since been off of lithium for almost a decade. No new medicines recently. She went to her PCPs this morning and they referred her to the ER.  Past Medical History  Diagnosis Date  . Bipolar 1 disorder, mixed   . Impaired hearing hearing aid- bilateral  . Elevated blood pressure (not hypertension) borderline-  monitored  . Seasonal allergies   . Asthma   . Urgency of urination   . Nocturia   . OSA on CPAP   . Arthritis of knee, left   . Stress incontinence, female   . Cataract immature BILATERAL  . Chronic kidney disease (CKD), stage III (moderate) MONITORED BY DR Moshe Cipro   Past Surgical History  Procedure Laterality Date  . Tubal ligation  20 YRS AGO  . Dx laparoscopy for infertility  81 YRS AGO  . Bladder suspension  05/07/2011    Procedure: [place] PROCEDURE;  Surgeon: Reece Packer, MD;  Location: [facility];  Service: Urology;  Laterality: N/A;  cysto, sparc sling    Family History  Problem Relation Age of  Onset  . Heart attack Mother   . Heart attack Father    History  Substance Use Topics  . Smoking status: Never Smoker   . Smokeless tobacco: Never Used  . Alcohol Use: Yes     Comment: social   OB History    No data available     Review of Systems  Constitutional: Negative for fever.  Gastrointestinal: Positive for nausea, abdominal pain and diarrhea. Negative for vomiting and blood in stool.  Genitourinary: Positive for decreased urine volume.  All other systems reviewed and are negative.     Allergies  Amoxicillin  Home Medications   Prior to Admission medications   Medication Sig Start Date End Date Taking? Authorizing Provider  ALPRAZolam Duanne Moron) 0.5 MG tablet Take 0.5 mg by mouth as needed. For anxiety.    Historical Provider, MD  aspirin EC 81 MG tablet Take 81 mg by mouth daily.    Historical Provider, MD  atorvastatin (LIPITOR) 20 MG tablet Take 20 mg by mouth daily.    Historical Provider, MD  buPROPion (WELLBUTRIN SR) 150 MG 12 hr tablet Take 150 mg by mouth every morning.     Historical Provider, MD  cyclobenzaprine (FLEXERIL) 10 MG tablet Take 10 mg by mouth 3 times/day as needed-between meals & bedtime for muscle spasms.     Historical Provider, MD  gabapentin (NEURONTIN) 300 MG capsule Take 300 mg by mouth every morning.  Historical Provider, MD  glimepiride (AMARYL) 2 MG tablet Take 2 mg by mouth daily. 1/2 tab in the am; 1/2 tab in the pm    Historical Provider, MD  HYDROcodone-acetaminophen (NORCO/VICODIN) 5-325 MG per tablet Take 1 tablet by mouth every 6 (six) hours as needed for moderate pain.    Historical Provider, MD  levothyroxine (SYNTHROID, LEVOTHROID) 50 MCG tablet Take 50 mcg by mouth daily.    Historical Provider, MD  lisinopril (PRINIVIL,ZESTRIL) 10 MG tablet Take 5 mg by mouth daily. 1/2 tab qd    Historical Provider, MD  Multiple Vitamin (MULITIVITAMIN WITH MINERALS) TABS Take 1 tablet by mouth 2 (two) times daily.    Historical Provider, MD   QUEtiapine (SEROQUEL) 400 MG tablet Take 400 mg by mouth at bedtime.    Historical Provider, MD  QUEtiapine (SEROQUEL) 50 MG tablet Take 50 mg by mouth at bedtime.    Historical Provider, MD  ranitidine (ZANTAC) 300 MG capsule Take 300 mg by mouth every evening.    Historical Provider, MD  sitaGLIPtin (JANUVIA) 25 MG tablet Take 25 mg by mouth daily.    Historical Provider, MD   BP 102/54 mmHg  Pulse 95  Temp(Src) 97.4 F (36.3 C) (Oral)  Resp 16  SpO2 97% Physical Exam  Constitutional: She is oriented to person, place, and time. She appears well-developed and well-nourished.  HENT:  Head: Normocephalic and atraumatic.  Right Ear: External ear normal.  Left Ear: External ear normal.  Nose: Nose normal.  Very dry mucous membranes, dry tongue  Eyes: Right eye exhibits no discharge. Left eye exhibits no discharge.  Cardiovascular: Normal rate, regular rhythm and normal heart sounds.   Pulmonary/Chest: Effort normal and breath sounds normal.  Abdominal: Soft. She exhibits no distension. There is no tenderness.  Neurological: She is alert and oriented to person, place, and time.  Skin: Skin is warm and dry. She is not diaphoretic.  Nursing note and vitals reviewed.   ED Course  Procedures (including critical care time) Labs Review Labs Reviewed  COMPREHENSIVE METABOLIC PANEL - Abnormal; Notable for the following:    CO2 17 (*)    Glucose, Bld 127 (*)    BUN 93 (*)    Creatinine, Ser 9.77 (*)    Alkaline Phosphatase 133 (*)    GFR calc non Af Amer 4 (*)    GFR calc Af Amer 4 (*)    Anion gap 17 (*)    All other components within normal limits  STOOL CULTURE  CLOSTRIDIUM DIFFICILE BY PCR  CBC WITH DIFFERENTIAL/PLATELET  URINALYSIS, ROUTINE W REFLEX MICROSCOPIC    Imaging Review No results found.   EKG Interpretation None      MDM   Final diagnoses:  Acute kidney injury    Patient appears very dehydrated, her acute renal failure is likely all from dehydration.  She has no acute electrolyte abnormalities that would warrant emergent dialysis. However given the degree of her renal failure she will likely need to be admitted to [facility]. Given IV hydration in the ER, will test stool for pathogens and admit to the hospitalist.    Ephraim Hamburger, MD 05/03/14 1651

## 2014-05-03 NOTE — ED Notes (Signed)
Pt c/o intermittent diarrhea since Friday, nausea. Denies abdominal pain or emesis.

## 2014-05-04 ENCOUNTER — Inpatient Hospital Stay (HOSPITAL_COMMUNITY): Payer: Medicare Other

## 2014-05-04 ENCOUNTER — Encounter (HOSPITAL_COMMUNITY): Payer: Self-pay | Admitting: *Deleted

## 2014-05-04 DIAGNOSIS — N179 Acute kidney failure, unspecified: Principal | ICD-10-CM

## 2014-05-04 DIAGNOSIS — I10 Essential (primary) hypertension: Secondary | ICD-10-CM

## 2014-05-04 LAB — MAGNESIUM: MAGNESIUM: 1.4 mg/dL — AB (ref 1.5–2.5)

## 2014-05-04 LAB — CBC WITH DIFFERENTIAL/PLATELET
BASOS ABS: 0 10*3/uL (ref 0.0–0.1)
Basophils Relative: 0 % (ref 0–1)
Eosinophils Absolute: 0.6 10*3/uL (ref 0.0–0.7)
Eosinophils Relative: 7 % — ABNORMAL HIGH (ref 0–5)
HCT: 30.7 % — ABNORMAL LOW (ref 36.0–46.0)
HEMOGLOBIN: 9.8 g/dL — AB (ref 12.0–15.0)
LYMPHS ABS: 2.8 10*3/uL (ref 0.7–4.0)
Lymphocytes Relative: 34 % (ref 12–46)
MCH: 29.8 pg (ref 26.0–34.0)
MCHC: 31.9 g/dL (ref 30.0–36.0)
MCV: 93.3 fL (ref 78.0–100.0)
Monocytes Absolute: 0.6 10*3/uL (ref 0.1–1.0)
Monocytes Relative: 7 % (ref 3–12)
NEUTROS ABS: 4.1 10*3/uL (ref 1.7–7.7)
NEUTROS PCT: 52 % (ref 43–77)
Platelets: 174 10*3/uL (ref 150–400)
RBC: 3.29 MIL/uL — AB (ref 3.87–5.11)
RDW: 14.3 % (ref 11.5–15.5)
WBC: 8.1 10*3/uL (ref 4.0–10.5)

## 2014-05-04 LAB — COMPREHENSIVE METABOLIC PANEL
ALK PHOS: 110 U/L (ref 39–117)
ALT: 21 U/L (ref 0–35)
AST: 13 U/L (ref 0–37)
Albumin: 2.7 g/dL — ABNORMAL LOW (ref 3.5–5.2)
Anion gap: 10 (ref 5–15)
BILIRUBIN TOTAL: 0.4 mg/dL (ref 0.3–1.2)
BUN: 83 mg/dL — AB (ref 6–23)
CHLORIDE: 105 mmol/L (ref 96–112)
CO2: 21 mmol/L (ref 19–32)
Calcium: 8.3 mg/dL — ABNORMAL LOW (ref 8.4–10.5)
Creatinine, Ser: 8.66 mg/dL — ABNORMAL HIGH (ref 0.50–1.10)
GFR calc Af Amer: 5 mL/min — ABNORMAL LOW (ref >90)
GFR calc non Af Amer: 4 mL/min — ABNORMAL LOW (ref >90)
Glucose, Bld: 114 mg/dL — ABNORMAL HIGH (ref 70–99)
POTASSIUM: 4.5 mmol/L (ref 3.5–5.1)
SODIUM: 136 mmol/L (ref 135–145)
Total Protein: 5.3 g/dL — ABNORMAL LOW (ref 6.0–8.3)

## 2014-05-04 LAB — LIPID PANEL
Cholesterol: 95 mg/dL (ref 0–200)
HDL: 29 mg/dL — AB (ref >39)
LDL CALC: 34 mg/dL (ref 0–99)
TRIGLYCERIDES: 158 mg/dL — AB (ref <150)
Total CHOL/HDL Ratio: 3.3 RATIO
VLDL: 32 mg/dL (ref 0–40)

## 2014-05-04 LAB — CREATININE, URINE, RANDOM: Creatinine, Urine: 253.4 mg/dL

## 2014-05-04 LAB — SODIUM, URINE, RANDOM: SODIUM UR: 27 meq/L

## 2014-05-04 LAB — PROTIME-INR
INR: 1.06 (ref 0.00–1.49)
PROTHROMBIN TIME: 13.9 s (ref 11.6–15.2)

## 2014-05-04 LAB — GLUCOSE, CAPILLARY
GLUCOSE-CAPILLARY: 103 mg/dL — AB (ref 70–99)
Glucose-Capillary: 108 mg/dL — ABNORMAL HIGH (ref 70–99)
Glucose-Capillary: 73 mg/dL (ref 70–99)

## 2014-05-04 LAB — CALCIUM, URINE, RANDOM: Calcium, Ur: 1 mg/dL

## 2014-05-04 LAB — PROTEIN, URINE, RANDOM: Total Protein, Urine: 30 mg/dL — ABNORMAL HIGH (ref 5–24)

## 2014-05-04 LAB — APTT: aPTT: 31 seconds (ref 24–37)

## 2014-05-04 LAB — PHOSPHORUS: PHOSPHORUS: 4 mg/dL (ref 2.3–4.6)

## 2014-05-04 LAB — CLOSTRIDIUM DIFFICILE BY PCR: Toxigenic C. Difficile by PCR: NEGATIVE

## 2014-05-04 LAB — TSH: TSH: 0.865 u[IU]/mL (ref 0.350–4.500)

## 2014-05-04 MED ORDER — SODIUM CHLORIDE 0.9 % IV BOLUS (SEPSIS)
1000.0000 mL | INTRAVENOUS | Status: DC | PRN
  Administered 2014-05-05: 1000 mL via INTRAVENOUS
  Filled 2014-05-04: qty 1000

## 2014-05-04 MED ORDER — LACTATED RINGERS IV SOLN
INTRAVENOUS | Status: DC
  Administered 2014-05-04: 13:00:00 via INTRAVENOUS

## 2014-05-04 MED ORDER — SODIUM CHLORIDE 0.9 % IV BOLUS (SEPSIS)
1000.0000 mL | Freq: Once | INTRAVENOUS | Status: AC
  Administered 2014-05-04: 1000 mL via INTRAVENOUS

## 2014-05-04 MED ORDER — SODIUM CHLORIDE 0.9 % IV SOLN
INTRAVENOUS | Status: DC
  Administered 2014-05-04: 08:00:00 via INTRAVENOUS

## 2014-05-04 MED ORDER — PANTOPRAZOLE SODIUM 40 MG PO TBEC
40.0000 mg | DELAYED_RELEASE_TABLET | Freq: Every day | ORAL | Status: DC
  Administered 2014-05-05: 40 mg via ORAL
  Filled 2014-05-04: qty 1

## 2014-05-04 NOTE — Progress Notes (Signed)
Patient Demographics  Denise Serrano, is a 59 y.o. female, DOB - [birth year], [id number]  Admit date - 05/03/2014   Admitting Physician Nat Math, MD  Outpatient Primary MD for the patient is Hayden Rasmussen., MD  LOS - 1   Chief Complaint  Patient presents with  . Diarrhea        Subjective:   Denise Serrano today has, No headache, No chest pain, No abdominal pain - No Nausea, No new weakness tingling or numbness, No Cough - SOB.   Assessment & Plan    1. One-week history of diarrhea with extreme dehydration and hypotension. Acute renal failure. Stool studies pending, IV fluid bolus and maintenance, monitor closely. Renal on board. CT scan abdomen pelvis nonacute.   2. Metabolic acidosis. Due to acute renal failure. Replaced IV bicarbonate. Monitor.   3. Type 2 diabetes mellitus with diabetic polyneuropathy. Hold oral hypoglycemics and Neurontin, sliding scale insulin.  Lab Results  Component Value Date   HGBA1C 6.8* 11/14/2013    CBG (last 3)   Recent Labs  05/03/14 1941 05/04/14 0736 05/04/14 1144  GLUCAP 111* 108* 73    4. Essential hypertension. Blood pressure soft hold all medications.   5. Bipolar disorder. Home medications continued.   6. Baseline dysarthria. Supportive care.   7. Hypothyroidism. Home dose Synthroid continue, TSH stable.   8. Dyslipidemia. On statin.     Code Status: Full  Family Communication: None  Disposition Plan: Home   Procedures   CT Abd Pelvis   Consults   Renal   Medications  Scheduled Meds: . aspirin EC  81 mg Oral Daily  . buPROPion  150 mg Oral q morning - 10a  . heparin  5,000 Units Subcutaneous 3 times per day  . insulin aspart  0-9 Units Subcutaneous TID WC  . levothyroxine  50 mcg Oral QAC  breakfast  . multivitamin with minerals  1 tablet Oral Daily  . pantoprazole (PROTONIX) IV  40 mg Intravenous Q24H  . QUEtiapine  600 mg Oral QHS  . sodium chloride  3 mL Intravenous Q12H   Continuous Infusions: . sodium chloride 125 mL/hr at 05/04/14 0740   PRN Meds:.acetaminophen **OR** [DISCONTINUED] acetaminophen, ALPRAZolam, hydrALAZINE, morphine injection, [DISCONTINUED] ondansetron **OR** ondansetron (ZOFRAN) IV, sodium chloride, zolpidem  DVT Prophylaxis   Heparin    Lab Results  Component Value Date   PLT 174 05/04/2014    Antibiotics     Anti-infectives    None          Objective:   Filed Vitals:   05/03/14 1803 05/03/14 1957 05/04/14 0447 05/04/14 0925  BP: 93/54 86/44 73/44  87/44  Pulse: 82 87 77 87  Temp:  97.6 F (36.4 C) 97.7 F (36.5 C) 97.7 F (36.5 C)  TempSrc:  Oral Oral Oral  Resp: 22 19 17 16   Weight:  97.6 kg (215 lb 2.7 oz)    SpO2: 96% 87% 98% 100%    Wt Readings from Last 3 Encounters:  05/03/14 97.6 kg (215 lb 2.7 oz)  01/03/14 93.804 kg (206 lb 12.8 oz)  12/14/13 94.076 kg (207 lb 6.4 oz)     Intake/Output Summary (Last 24 hours) at 05/04/14 1158 Last data filed at 05/04/14 1133  Gross per  24 hour  Intake   3240 ml  Output    900 ml  Net   2340 ml     Physical Exam  Awake Alert, Oriented X 3, No new F.N deficits, Normal affect, baseline dysarthria [place].AT,PERRAL Supple Neck,No JVD, No cervical lymphadenopathy appriciated.  Symmetrical Chest wall movement, Good air movement bilaterally, CTAB RRR,No Gallops,Rubs or new Murmurs, No Parasternal Heave +ve B.Sounds, Abd Soft, No tenderness, No organomegaly appriciated, No rebound - guarding or rigidity. No Cyanosis, Clubbing or edema, No new Rash or bruise      Data Review   Micro Results Recent Results (from the past 240 hour(s))  Clostridium Difficile by PCR     Status: None   Collection Time: 05/03/14  9:30 PM  Result Value Ref Range Status   C difficile by pcr  NEGATIVE NEGATIVE Final    Radiology Reports Ct Abdomen Pelvis Wo Contrast  05/04/2014   CLINICAL DATA:  Diarrhea for 1 week. Acute on chronic renal failure.  EXAM: CT ABDOMEN AND PELVIS WITHOUT CONTRAST  TECHNIQUE: Multidetector CT imaging of the abdomen and pelvis was performed following the standard protocol without IV contrast.  COMPARISON:  None.  FINDINGS: There is no pleural or pericardial effusion. Heart size is upper normal. Mild dependent atelectasis is noted.  No renal or ureteral stones are identified. Small parenchymal calcification with an overlying parenchymal defect is noted in the midpole of the right kidney compatible with scar. The urinary bladder is decompressed with a Foley catheter in place.  The adrenal glands, spleen, pancreas and gallbladder are unremarkable. Diffuse fatty infiltration of the liver is identified. No focal liver lesion or biliary ductal dilatation is seen. Aortoiliac atherosclerosis without aneurysm is noted.  The patient has a small umbilical hernia containing a knuckle of colon. There is no obstruction other complicating feature. The stomach, small bowel and appendix appear normal. No lymphadenopathy or fluid is identified. No focal bony abnormality.  IMPRESSION: Negative for hydronephrosis or urinary tract stone. Small scar in the midpole the right kidney is noted.  Fatty infiltration.   Electronically Signed   By: Inge Rise M.D.   On: 05/04/2014 11:16   Dg Chest Port 1 View  05/03/2014   CLINICAL DATA:  Diarrhea for 6 days. Decreased urine output. Dehydration. Chronic renal disease. Nonsmoker.  EXAM: PORTABLE CHEST - 1 VIEW  COMPARISON:  06/28/2009  FINDINGS: Shallow inspiration. Heart size and pulmonary vascularity are normal for technique. No focal airspace disease or consolidation in the lungs. No blunting of costophrenic angles. No pneumothorax. Mediastinal contours appear intact.  IMPRESSION: No active disease.   Electronically Signed   By: Lucienne Capers M.D.   On: 05/03/2014 17:09     CBC  Recent Labs Lab 05/03/14 1443 05/03/14 2042 05/04/14 0500  WBC 9.8 10.0 8.1  HGB 12.2 10.2* 9.8*  HCT 37.7 31.6* 30.7*  PLT 194 178 174  MCV 94.5 93.5 93.3  MCH 30.6 30.2 29.8  MCHC 32.4 32.3 31.9  RDW 14.1 14.3 14.3  LYMPHSABS 2.2  --  2.8  MONOABS 0.9  --  0.6  EOSABS 0.4  --  0.6  BASOSABS 0.0  --  0.0    Chemistries   Recent Labs Lab 05/03/14 1443 05/03/14 2042 05/04/14 0500  NA 137  --  136  K 4.5  --  4.5  CL 103  --  105  CO2 17*  --  21  GLUCOSE 127*  --  114*  BUN 93*  --  83*  CREATININE 9.77* 9.25* 8.66*  CALCIUM 9.1  --  8.3*  MG  --   --  1.4*  AST 15  --  13  ALT 26  --  21  ALKPHOS 133*  --  110  BILITOT 0.9  --  0.4   ------------------------------------------------------------------------------------------------------------------ estimated creatinine clearance is 7.3 mL/min (by C-G formula based on Cr of 8.66). ------------------------------------------------------------------------------------------------------------------ No results for input(s): HGBA1C in the last 72 hours. ------------------------------------------------------------------------------------------------------------------  Recent Labs  05/04/14 0500  CHOL 95  HDL 29*  LDLCALC 34  TRIG 158*  CHOLHDL 3.3   ------------------------------------------------------------------------------------------------------------------  Recent Labs  05/04/14 0500  TSH 0.865   ------------------------------------------------------------------------------------------------------------------ No results for input(s): VITAMINB12, FOLATE, FERRITIN, TIBC, IRON, RETICCTPCT in the last 72 hours.  Coagulation profile  Recent Labs Lab 05/04/14 0500  INR 1.06    No results for input(s): DDIMER in the last 72 hours.  Cardiac Enzymes No results for input(s): CKMB, TROPONINI, MYOGLOBIN in the last 168 hours.  Invalid input(s):  CK ------------------------------------------------------------------------------------------------------------------ Invalid input(s): POCBNP     Time Spent in minutes   35   Lala Lund K M.D on 05/04/2014 at 11:58 AM  Between 7am to 7pm - Pager - [phone]  After 7pm go to [email or web address] - [facility] Office  [phone]

## 2014-05-04 NOTE — Progress Notes (Signed)
Admit: 05/03/2014 LOS: 1  42F CKD3 and AoCKD 2/2 hypovolemia/diarrhea and ACEi  Subjective:  Good UOP yesterday BP ok Tolerating PO, wants to advance diet GFR recovering   02/11 0701 - 02/12 0700 In: 2000 [IV Piggyback:2000] Out: -   Filed Weights   05/03/14 1957  Weight: 97.6 kg (215 lb 2.7 oz)    Scheduled Meds: . aspirin EC  81 mg Oral Daily  . buPROPion  150 mg Oral q morning - 10a  . heparin  5,000 Units Subcutaneous 3 times per day  . insulin aspart  0-9 Units Subcutaneous TID WC  . levothyroxine  50 mcg Oral QAC breakfast  . multivitamin with minerals  1 tablet Oral Daily  . pantoprazole (PROTONIX) IV  40 mg Intravenous Q24H  . QUEtiapine  600 mg Oral QHS  . sodium chloride  3 mL Intravenous Q12H   Continuous Infusions: . lactated ringers     PRN Meds:.acetaminophen **OR** [DISCONTINUED] acetaminophen, ALPRAZolam, hydrALAZINE, morphine injection, [DISCONTINUED] ondansetron **OR** ondansetron (ZOFRAN) IV, sodium chloride, zolpidem  Current Labs: reviewed    Physical Exam:  Blood pressure 87/44, pulse 87, temperature 97.7 F (36.5 C), temperature source Oral, resp. rate 16, weight 97.6 kg (215 lb 2.7 oz), SpO2 100 %. Obese, NAD, reading on her Kindle RRR Diminished BS b/l No LEE Foley in place Nonfocal  A/P 1. AoCKD3 1. BL SCr 1.8-2.0 2. AKI etiology hypovolemia + ACEi 3. Improving with IVFs, good UOP 4. Cont IVFs, change to LR today given risk for acidosis with NS 5. Cont to follow  Closely. No RRT needs. Daily weights, Daily Renal Panel, Strict I/Os, Avoid nephrotoxins (NSAIDs, judicious IV Contrast) 1. Hypotension: cont to follow; no BP meds.  Rec IVFs 2. Acidosis: cont to follow, HCO3 improved today.  On LR 3. Diarrhea: CT neg, w/u ongoing. None sicne arrival.  1. Advance diet per pt request  Pearson Grippe MD 05/04/2014, 12:11 PM   Recent Labs Lab 05/03/14 1443 05/03/14 2042 05/04/14 0500  NA 137  --  136  K 4.5  --  4.5  CL 103  --  105   CO2 17*  --  21  GLUCOSE 127*  --  114*  BUN 93*  --  83*  CREATININE 9.77* 9.25* 8.66*  CALCIUM 9.1  --  8.3*  PHOS  --   --  4.0    Recent Labs Lab 05/03/14 1443 05/03/14 2042 05/04/14 0500  WBC 9.8 10.0 8.1  NEUTROABS 6.3  --  4.1  HGB 12.2 10.2* 9.8*  HCT 37.7 31.6* 30.7*  MCV 94.5 93.5 93.3  PLT 194 178 174

## 2014-05-04 NOTE — Progress Notes (Signed)
Pt states she will place herself on cpap when ready for the night. Encouraged pt to call if she needs assistance, she communicates understanding

## 2014-05-05 ENCOUNTER — Encounter (HOSPITAL_COMMUNITY): Payer: Self-pay | Admitting: *Deleted

## 2014-05-05 LAB — HEMOGLOBIN A1C
HEMOGLOBIN A1C: 7 % — AB (ref 4.8–5.6)
Mean Plasma Glucose: 154 mg/dL

## 2014-05-05 LAB — BASIC METABOLIC PANEL
Anion gap: 11 (ref 5–15)
BUN: 73 mg/dL — ABNORMAL HIGH (ref 6–23)
CHLORIDE: 107 mmol/L (ref 96–112)
CO2: 20 mmol/L (ref 19–32)
CREATININE: 6.45 mg/dL — AB (ref 0.50–1.10)
Calcium: 8.2 mg/dL — ABNORMAL LOW (ref 8.4–10.5)
GFR calc Af Amer: 7 mL/min — ABNORMAL LOW (ref >90)
GFR calc non Af Amer: 6 mL/min — ABNORMAL LOW (ref >90)
Glucose, Bld: 86 mg/dL (ref 70–99)
Potassium: 4.5 mmol/L (ref 3.5–5.1)
Sodium: 138 mmol/L (ref 135–145)

## 2014-05-05 LAB — GLUCOSE, CAPILLARY
GLUCOSE-CAPILLARY: 85 mg/dL (ref 70–99)
GLUCOSE-CAPILLARY: 89 mg/dL (ref 70–99)
Glucose-Capillary: 162 mg/dL — ABNORMAL HIGH (ref 70–99)
Glucose-Capillary: 153 mg/dL — ABNORMAL HIGH (ref 70–99)

## 2014-05-05 LAB — URINE CULTURE
Colony Count: NO GROWTH
Culture: NO GROWTH

## 2014-05-05 MED ORDER — MIRABEGRON ER 50 MG PO TB24: 50.0000 mg | ORAL_TABLET | Freq: Every day | ORAL | Status: DC

## 2014-05-05 MED ORDER — QUETIAPINE FUMARATE ER 300 MG PO TB24: 600.0000 mg | ORAL_TABLET | Freq: Every day | ORAL | Status: DC

## 2014-05-05 MED ORDER — MORPHINE SULFATE 2 MG/ML IJ SOLN: 1.0000 mg | INTRAMUSCULAR | Status: DC | PRN

## 2014-05-05 MED ORDER — LACTATED RINGERS IV SOLN
INTRAVENOUS | Status: DC
  Administered 2014-05-05 (×2): via INTRAVENOUS

## 2014-05-05 MED ORDER — ALPRAZOLAM 0.25 MG PO TABS: 0.2500 mg | ORAL_TABLET | Freq: Three times a day (TID) | ORAL | Status: DC | PRN

## 2014-05-05 MED ORDER — QUETIAPINE FUMARATE ER 300 MG PO TB24
300.0000 mg | ORAL_TABLET | Freq: Every day | ORAL | Status: DC
  Filled 2014-05-05: qty 1

## 2014-05-05 MED ORDER — QUETIAPINE FUMARATE ER 300 MG PO TB24
600.0000 mg | ORAL_TABLET | Freq: Once | ORAL | Status: AC
  Administered 2014-05-05: 600 mg via ORAL
  Filled 2014-05-05: qty 2

## 2014-05-05 MED ORDER — BUPROPION HCL ER (SR) 100 MG PO TB12
100.0000 mg | ORAL_TABLET | Freq: Every morning | ORAL | Status: DC
  Administered 2014-05-05 – 2014-05-06 (×2): 100 mg via ORAL
  Filled 2014-05-05 (×2): qty 1

## 2014-05-05 NOTE — Progress Notes (Signed)
Patient Demographics  Denise Serrano, is a 59 y.o. female, DOB - Feb 05, 1956, [id number]  Admit date - 05/03/2014   Admitting Physician Nat Math, MD  Outpatient Primary MD for the patient is Hayden Rasmussen., MD  LOS - 2   Chief Complaint  Patient presents with  . Diarrhea        Subjective:   [name] today has, No headache, No chest pain, No abdominal pain - No Nausea, No new weakness tingling or numbness, No Cough - SOB.   Assessment & Plan    1. One-week history of diarrhea with extreme dehydration and hypotension. Acute renal failure - Improve. Stool studies pending, IV fluid bolus and maintenance, monitor closely. Renal on board. CT scan abdomen pelvis nonacute.   2. Metabolic acidosis. Due to acute renal failure. Replaced IV bicarbonate. Monitor.   3. Type 2 diabetes mellitus with diabetic polyneuropathy. Hold oral hypoglycemics and Neurontin, sliding scale insulin.  Lab Results  Component Value Date   HGBA1C 7.0* 05/03/2014    CBG (last 3)   Recent Labs  05/04/14 1144 05/04/14 1642 05/05/14 0757  GLUCAP 73 103* 85    4. Essential hypertension. Blood pressure soft hold all medications.   5. Bipolar disorder. Home medications continued at 1/2 home dose.   6. Baseline dysarthria. Supportive care.   7. Hypothyroidism. Home dose Synthroid continue, TSH stable.   8. Dyslipidemia. On statin.     Code Status: Full  Family Communication: None  Disposition Plan: Home   Procedures   CT Abd Pelvis   Consults   Renal   Medications  Scheduled Meds: . aspirin EC  81 mg Oral Daily  . buPROPion  100 mg Oral q morning - 10a  . heparin  5,000 Units Subcutaneous 3 times per day  . insulin aspart  0-9 Units Subcutaneous TID WC  .  levothyroxine  50 mcg Oral QAC breakfast  . multivitamin with minerals  1 tablet Oral Daily  . pantoprazole  40 mg Oral QHS  . QUEtiapine  300 mg Oral QHS  . sodium chloride  3 mL Intravenous Q12H   Continuous Infusions: . lactated ringers 125 mL/hr at 05/05/14 0947   PRN Meds:.acetaminophen **OR** [DISCONTINUED] acetaminophen, ALPRAZolam, morphine injection, [DISCONTINUED] ondansetron **OR** ondansetron (ZOFRAN) IV, sodium chloride, zolpidem  DVT Prophylaxis   Heparin    Lab Results  Component Value Date   PLT 174 05/04/2014    Antibiotics     Anti-infectives    None          Objective:   Filed Vitals:   05/04/14 1750 05/04/14 1900 05/05/14 0138 05/05/14 0508  BP: 124/50 128/56  66/35  Pulse: 95 94  91  Temp: 97.6 F (36.4 C) 97.7 F (36.5 C)  99.3 F (37.4 C)  TempSrc: Oral Oral  Oral  Resp: 16 18  16   Weight:  99.1 kg (218 lb 7.6 oz) 99.1 kg (218 lb 7.6 oz)   SpO2: 100% 97%  95%    Wt Readings from Last 3 Encounters:  05/05/14 99.1 kg (218 lb 7.6 oz)  01/03/14 93.804 kg (206 lb 12.8 oz)  12/14/13 94.076 kg (207 lb 6.4 oz)     Intake/Output Summary (Last 24 hours) at 05/05/14 1029 Last  data filed at 05/05/14 0900  Gross per 24 hour  Intake   1690 ml  Output   2750 ml  Net  -1060 ml     Physical Exam  Awake Alert, Oriented X 3, No new F.N deficits, Normal affect, baseline dysarthria [place].AT,PERRAL Supple Neck,No JVD, No cervical lymphadenopathy appriciated.  Symmetrical Chest wall movement, Good air movement bilaterally, CTAB RRR,No Gallops,Rubs or new Murmurs, No Parasternal Heave +ve B.Sounds, Abd Soft, No tenderness, No organomegaly appriciated, No rebound - guarding or rigidity. No Cyanosis, Clubbing or edema, No new Rash or bruise      Data Review   Micro Results Recent Results (from the past 240 hour(s))  Culture, Urine     Status: None   Collection Time: 05/03/14  5:39 PM  Result Value Ref Range Status   Specimen Description URINE,  CLEAN CATCH  Final   Special Requests NONE  Final   Colony Count NO GROWTH Performed at [facility]   Final   Culture NO GROWTH Performed at [facility]   Final   Report Status 05/05/2014 FINAL  Final  Clostridium Difficile by PCR     Status: None   Collection Time: 05/03/14  9:30 PM  Result Value Ref Range Status   C difficile by pcr NEGATIVE NEGATIVE Final    Radiology Reports Ct Abdomen Pelvis Wo Contrast  05/04/2014   CLINICAL DATA:  Diarrhea for 1 week. Acute on chronic renal failure.  EXAM: CT ABDOMEN AND PELVIS WITHOUT CONTRAST  TECHNIQUE: Multidetector CT imaging of the abdomen and pelvis was performed following the standard protocol without IV contrast.  COMPARISON:  None.  FINDINGS: There is no pleural or pericardial effusion. Heart size is upper normal. Mild dependent atelectasis is noted.  No renal or ureteral stones are identified. Small parenchymal calcification with an overlying parenchymal defect is noted in the midpole of the right kidney compatible with scar. The urinary bladder is decompressed with a Foley catheter in place.  The adrenal glands, spleen, pancreas and gallbladder are unremarkable. Diffuse fatty infiltration of the liver is identified. No focal liver lesion or biliary ductal dilatation is seen. Aortoiliac atherosclerosis without aneurysm is noted.  The patient has a small umbilical hernia containing a knuckle of colon. There is no obstruction other complicating feature. The stomach, small bowel and appendix appear normal. No lymphadenopathy or fluid is identified. No focal bony abnormality.  IMPRESSION: Negative for hydronephrosis or urinary tract stone. Small scar in the midpole the right kidney is noted.  Fatty infiltration.   Electronically Signed   By: Inge Rise M.D.   On: 05/04/2014 11:16   Dg Chest Port 1 View  05/03/2014   CLINICAL DATA:  Diarrhea for 6 days. Decreased urine output. Dehydration. Chronic renal disease. Nonsmoker.   EXAM: PORTABLE CHEST - 1 VIEW  COMPARISON:  06/28/2009  FINDINGS: Shallow inspiration. Heart size and pulmonary vascularity are normal for technique. No focal airspace disease or consolidation in the lungs. No blunting of costophrenic angles. No pneumothorax. Mediastinal contours appear intact.  IMPRESSION: No active disease.   Electronically Signed   By: Lucienne Capers M.D.   On: 05/03/2014 17:09     CBC  Recent Labs Lab 05/03/14 1443 05/03/14 2042 05/04/14 0500  WBC 9.8 10.0 8.1  HGB 12.2 10.2* 9.8*  HCT 37.7 31.6* 30.7*  PLT 194 178 174  MCV 94.5 93.5 93.3  MCH 30.6 30.2 29.8  MCHC 32.4 32.3 31.9  RDW 14.1 14.3 14.3  LYMPHSABS 2.2  --  2.8  MONOABS 0.9  --  0.6  EOSABS 0.4  --  0.6  BASOSABS 0.0  --  0.0    Chemistries   Recent Labs Lab 05/03/14 1443 05/03/14 2042 05/04/14 0500 05/05/14 0807  NA 137  --  136 138  K 4.5  --  4.5 4.5  CL 103  --  105 107  CO2 17*  --  21 20  GLUCOSE 127*  --  114* 86  BUN 93*  --  83* 73*  CREATININE 9.77* 9.25* 8.66* 6.45*  CALCIUM 9.1  --  8.3* 8.2*  MG  --   --  1.4*  --   AST 15  --  13  --   ALT 26  --  21  --   ALKPHOS 133*  --  110  --   BILITOT 0.9  --  0.4  --    ------------------------------------------------------------------------------------------------------------------ estimated creatinine clearance is 9.9 mL/min (by C-G formula based on Cr of 6.45). ------------------------------------------------------------------------------------------------------------------  Recent Labs  05/03/14 2042  HGBA1C 7.0*   ------------------------------------------------------------------------------------------------------------------  Recent Labs  05/04/14 0500  CHOL 95  HDL 29*  LDLCALC 34  TRIG 158*  CHOLHDL 3.3   ------------------------------------------------------------------------------------------------------------------  Recent Labs  05/04/14 0500  TSH 0.865    ------------------------------------------------------------------------------------------------------------------ No results for input(s): VITAMINB12, FOLATE, FERRITIN, TIBC, IRON, RETICCTPCT in the last 72 hours.  Coagulation profile  Recent Labs Lab 05/04/14 0500  INR 1.06    No results for input(s): DDIMER in the last 72 hours.  Cardiac Enzymes No results for input(s): CKMB, TROPONINI, MYOGLOBIN in the last 168 hours.  Invalid input(s): CK ------------------------------------------------------------------------------------------------------------------ Invalid input(s): POCBNP     Time Spent in minutes   35   Lala Lund K M.D on 05/05/2014 at 10:29 AM  Between 7am to 7pm - Pager - [phone]  After 7pm go to [email or web address] - [facility] Office  [phone]

## 2014-05-05 NOTE — Progress Notes (Signed)
Pt blood pressure reading 66/35. She remains asymptomatic with no complaints of dizziness. Administering 1 liter bolus of NS per previous electronic order.  Dorthey Sawyer, RN

## 2014-05-05 NOTE — Progress Notes (Signed)
Admit: 05/03/2014 LOS: 2  43F CKD3 and AoCKD 2/2 hypovolemia/diarrhea and ACEi  Subjective:  Good UOP No diarrhea Eating and drinking well No c/o Foley removed  02/12 0701 - 02/13 0700 In: 2930 [P.O.:730; I.V.:1700] Out: 1900 [Urine:1900]  Filed Weights   05/03/14 1957 05/04/14 1900 05/05/14 0138  Weight: 97.6 kg (215 lb 2.7 oz) 99.1 kg (218 lb 7.6 oz) 99.1 kg (218 lb 7.6 oz)    Scheduled Meds: . aspirin EC  81 mg Oral Daily  . buPROPion  100 mg Oral q morning - 10a  . heparin  5,000 Units Subcutaneous 3 times per day  . insulin aspart  0-9 Units Subcutaneous TID WC  . levothyroxine  50 mcg Oral QAC breakfast  . multivitamin with minerals  1 tablet Oral Daily  . pantoprazole  40 mg Oral QHS  . QUEtiapine  300 mg Oral QHS  . sodium chloride  3 mL Intravenous Q12H   Continuous Infusions: . lactated ringers 125 mL/hr at 05/05/14 0947   PRN Meds:.acetaminophen **OR** [DISCONTINUED] acetaminophen, ALPRAZolam, hydrALAZINE, morphine injection, [DISCONTINUED] ondansetron **OR** ondansetron (ZOFRAN) IV, sodium chloride, zolpidem  Current Labs: reviewed    Physical Exam:  Blood pressure 66/35, pulse 91, temperature 99.3 F (37.4 C), temperature source Oral, resp. rate 16, weight 99.1 kg (218 lb 7.6 oz), SpO2 95 %. Obese, NAD, reading on her Kindle RRR Diminished BS b/l No LEE No foley Nonfocal  A/P 1. AoCKD3 1. BL SCr 1.8-2.0 2. AKI etiology hypovolemia + ACEi 3. 2/12 CT A/P noncontrasted w/ no structural renal issues or HN 4. Improving with IVFs, good UOP 5. Cont IVFs, LR 6. Will cont to follow 7. Expect full or near full recovery Daily weights, Daily Renal Panel, Strict I/Os, Avoid nephrotoxins (NSAIDs, judicious IV Contrast) 1. Hypotension: cont to follow; no BP meds.  Rec IVFs 2. Acidosis: stable 3. Diarrhea: CT neg, w/u ongoing. None sicne arrival.   Pearson Grippe MD 05/05/2014, 10:26 AM   Recent Labs Lab 05/03/14 1443 05/03/14 2042 05/04/14 0500  05/05/14 0807  NA 137  --  136 138  K 4.5  --  4.5 4.5  CL 103  --  105 107  CO2 17*  --  21 20  GLUCOSE 127*  --  114* 86  BUN 93*  --  83* 73*  CREATININE 9.77* 9.25* 8.66* 6.45*  CALCIUM 9.1  --  8.3* 8.2*  PHOS  --   --  4.0  --     Recent Labs Lab 05/03/14 1443 05/03/14 2042 05/04/14 0500  WBC 9.8 10.0 8.1  NEUTROABS 6.3  --  4.1  HGB 12.2 10.2* 9.8*  HCT 37.7 31.6* 30.7*  MCV 94.5 93.5 93.3  PLT 194 178 174

## 2014-05-05 NOTE — Progress Notes (Signed)
Pt states she can place herself on CPAP when ready. I told her to call if she needs further assistance

## 2014-05-05 NOTE — Progress Notes (Signed)
Pt refusing bed alarm at this time. Provided education regarding safety and risks for falls. Pt verbalized understanding and still refused. Will continue to monitor with door open. Velora Mediate

## 2014-05-06 LAB — BASIC METABOLIC PANEL
ANION GAP: 11 (ref 5–15)
BUN: 69 mg/dL — ABNORMAL HIGH (ref 6–23)
CALCIUM: 9.2 mg/dL (ref 8.4–10.5)
CO2: 17 mmol/L — AB (ref 19–32)
Chloride: 115 mmol/L — ABNORMAL HIGH (ref 96–112)
Creatinine, Ser: 5.3 mg/dL — ABNORMAL HIGH (ref 0.50–1.10)
GFR calc Af Amer: 9 mL/min — ABNORMAL LOW (ref >90)
GFR calc non Af Amer: 8 mL/min — ABNORMAL LOW (ref >90)
GLUCOSE: 90 mg/dL (ref 70–99)
Potassium: 5 mmol/L (ref 3.5–5.1)
Sodium: 143 mmol/L (ref 135–145)

## 2014-05-06 LAB — GLUCOSE, CAPILLARY: Glucose-Capillary: 96 mg/dL (ref 70–99)

## 2014-05-06 MED ORDER — GLIMEPIRIDE 1 MG PO TABS: 1.0000 mg | ORAL_TABLET | Freq: Every day | ORAL | Status: DC

## 2014-05-06 MED ORDER — SITAGLIPTIN PHOSPHATE 25 MG PO TABS: 25.0000 mg | ORAL_TABLET | Freq: Every day | ORAL | Status: DC

## 2014-05-06 NOTE — Progress Notes (Signed)
Patient discharge teaching given, including activity, diet, follow-up appoints, and medications. Patient verbalized understanding of all discharge instructions. IV access was d/c'd. Vitals are stable. Skin is intact except as charted in most recent assessments. Pt to be escorted out by RN, to be driven home by family.  Arlyn Bumpus, MBA, BS, RN 

## 2014-05-06 NOTE — Discharge Summary (Signed)
Denise Serrano, is a 59 y.o. female  DOB [birth year]  MRN [id number].  Admission date:  05/03/2014  Admitting Physician  Nat Math, MD  Discharge Date:  05/06/2014   Primary MD  Hayden Rasmussen., MD  Recommendations for primary care physician for things to follow:    Monitor BMP and CBGs closely   Admission Diagnosis  AKI (acute kidney injury) [N17.9] Acute kidney injury [N17.9]   Discharge Diagnosis  AKI (acute kidney injury) [N17.9] Acute kidney injury [N17.9]     Active Problems:   Type 2 diabetes mellitus with diabetic chronic kidney disease   Diabetic polyneuropathy associated with type 2 diabetes mellitus   AKI (acute kidney injury)   Bipolar disorder   Essential hypertension   Diarrhea of presumed infectious origin   Hypothyroidism   Hyperlipidemia      Past Medical History  Diagnosis Date  . Bipolar 1 disorder, mixed   . Impaired hearing hearing aid- bilateral  . Elevated blood pressure (not hypertension) borderline-  monitored  . Seasonal allergies   . Asthma   . Urgency of urination   . Nocturia   . OSA on CPAP   . Arthritis of knee, left   . Stress incontinence, female   . Cataract immature BILATERAL  . Chronic kidney disease (CKD), stage III (moderate) MONITORED BY DR Moshe Cipro    Past Surgical History  Procedure Laterality Date  . Tubal ligation  20 YRS AGO  . Dx laparoscopy for infertility  8 YRS AGO  . Bladder suspension  05/07/2011    Procedure: [place] PROCEDURE;  Surgeon: Reece Packer, MD;  Location: [facility];  Service: Urology;  Laterality: N/A;  cysto, sparc sling        History of present illness and  Hospital Course:     Kindly see H&P for history of present illness and admission details, please review complete Labs, Consult reports and  Test reports for all details in brief  HPI  from the history and physical done on the day of admission   Denise Serrano is an extremely pleasant 59 year old female with diabetes mellitus type 2 complicated by CK D stage III and peripheral neuropathy/essential hypertension/hyperlipidemia/hypothyroidism/bipolar disorder who presented to the emergency department with generalized weakness associated with copious nonbloody diarrhea that started about a week ago after patient ate a salad while she was out with her friend and she is found to have acute kidney injury with BUN 93 creatinine 9.77, from baseline creatinine ?1.7(followed by Dr. 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 She denies any fever or chills since the diarrhea started and reports that she continued to take stool softeners(didn't think to hold them in spite of the diarrhea). Her blood pressure is borderline low around [id number] systolic today. White count is normal, UA is pending, so is chest x-ray. Patient was taking lisinopril/Januvia/Amaryl/Neurontin/Flexeril/Zantac among other medications. Likely, she developed dehydration related to diarrhea which is likely infectious  in etiology(bacterial/viral?), in setting of ACE inhibitor/hypotension which accelerated acute kidney injury. Hopefully, this is temporary. Patient will be admitted to telemetry at [facility] for further investigations and management which would include urine lites/UA/urine culture/chest x-ray/renal ultrasound/CT abdomen and pelvis with oral contrast/stool studies including stool culture(?E. Coli- ate salad the night diarrhea started)/C. difficile PCR(now recent usage of antibiotics or sick contacts-therefore C difficile unlikely)/CPK. Will hold potential nephrotoxic medications/stool softners. Will consult nephrology. Meanwhile, will give IV fluids and insert Foley catheter for input and output  monitoring. Will not give antibiotics for now as no clear evidence of active infection.   Hospital Course    1. One-week history of diarrhea with extreme dehydration and hypotension. Acute renal failure - Improved. Diarrhea completely resolved, C. difficile negative, renal function much improved after IV fluids she received about 6 L of IV fluid so far, creatinine has come down considerably, discussed with nephrologist on call doctor [facility] clear for discharge with outpatient follow-up with PCP and nephrology. Renal ultrasound unremarkable extremely good urine output.   2. Metabolic acidosis. Due to acute renal failure and Chloride in IVF, stable.   3. Type 2 diabetes mellitus with diabetic polyneuropathy. A1c was 7. Continue Neurontin, continue Glucotrol at half home dose due to renal insufficiency. Request PCP to monitor CBGs and adjust.    Recent Labs    Lab Results  Component Value Date   HGBA1C 7.0* 05/03/2014      4. Essential hypertension. Blood pressure srable hold ACE due to ARF.   5. Bipolar disorder. Home medications continue at   home dose.   6. Baseline dysarthria. Supportive care.   7. Hypothyroidism. Home dose Synthroid continue, TSH stable.   8. Dyslipidemia. On statin.        Discharge Condition: Stable   Follow UP  Follow-up Information    Follow up with [facility] MD. Schedule an appointment as soon as possible for a visit in 3 days.   Specialty:  Family Medicine   Contact information:   [address] [place] [address] [phone]       Follow up with Ulla Potash., MD. Schedule an appointment as soon as possible for a visit in 1 week.   Specialty:  Nephrology   Contact information:   [place] [address] [phone]         Discharge Instructions  and  Discharge Medications          Discharge Instructions    Diet - low sodium heart healthy    Complete by:  As  directed      Discharge instructions    Complete by:  As directed   Follow with Primary MD Hayden Rasmussen., MD in 3 days   Get CBC, CMP, 2 view Chest X ray checked  by Primary MD next visit.    Activity: As tolerated with Full fall precautions use walker/cane & assistance as needed   Disposition Home     Diet: Heart Healthy  Low Carb  Accuchecks 4 times/day, Once in AM empty stomach and then before each meal. Log in all results and show them to your Prim.MD in 3 days. If any glucose reading is under 80 or above 300 call your Prim MD immidiately. Follow Low glucose instructions for glucose under 80 as instructed.     On your next visit with your primary care physician please Get Medicines reviewed and adjusted.   Please request your Prim.MD to go over all Hospital Tests  and Procedure/Radiological results at the follow up, please get all Hospital records sent to your Prim MD by signing hospital release before you go home.   If you experience worsening of your admission symptoms, develop shortness of breath, life threatening emergency, suicidal or homicidal thoughts you must seek medical attention immediately by calling 911 or calling your MD immediately  if symptoms less severe.  You Must read complete instructions/literature along with all the possible adverse reactions/side effects for all the Medicines you take and that have been prescribed to you. Take any new Medicines after you have completely understood and accpet all the possible adverse reactions/side effects.   Do not drive, operating heavy machinery, perform activities at heights, swimming or participation in water activities or provide baby sitting services if your were admitted for syncope or siezures until you have seen by Primary MD or a Neurologist and advised to do so again.  Do not drive when taking Pain medications.    Do not take Serrano than prescribed Pain, Sleep and Anxiety Medications  Special  Instructions: If you have smoked or chewed Tobacco  in the last 2 yrs please stop smoking, stop any regular Alcohol  and or any Recreational drug use.  Wear Seat belts while driving.   Please note  You were cared for by a hospitalist during your hospital stay. If you have any questions about your discharge medications or the care you received while you were in the hospital after you are discharged, you can call the unit and asked to speak with the hospitalist on call if the hospitalist that took care of you is not available. Once you are discharged, your primary care physician will handle any further medical issues. Please note that NO REFILLS for any discharge medications will be authorized once you are discharged, as it is imperative that you return to your primary care physician (or establish a relationship with a primary care physician if you do not have one) for your aftercare needs so that they can reassess your need for medications and monitor your lab values.     Increase activity slowly    Complete by:  As directed             Medication List    STOP taking these medications        lisinopril 10 MG tablet  Commonly known as:  PRINIVIL,ZESTRIL      TAKE these medications        ALPRAZolam 0.5 MG tablet  Commonly known as:  XANAX  Take 0.5 mg by mouth 3 (three) times daily as needed for anxiety (anxiety). For anxiety.     aspirin EC 81 MG tablet  Take 81 mg by mouth daily.     atorvastatin 20 MG tablet  Commonly known as:  LIPITOR  Take 20 mg by mouth at bedtime.     buPROPion 150 MG 12 hr tablet  Commonly known as:  WELLBUTRIN SR  Take 150 mg by mouth every morning.     FLEXERIL 10 MG tablet  Generic drug:  cyclobenzaprine  Take 10 mg by mouth 3 (three) times daily as needed for muscle spasms (muscle spams).     glimepiride 1 MG tablet  Commonly known as:  AMARYL  Take 1 tablet (1 mg total) by mouth daily with breakfast.     levothyroxine 50 MCG tablet    Commonly known as:  SYNTHROID, LEVOTHROID  Take 50 mcg by mouth daily.     montelukast 10 MG  tablet  Commonly known as:  SINGULAIR  Take 10 mg by mouth at bedtime.     multivitamin with minerals Tabs tablet  Take 1 tablet by mouth 2 (two) times daily.     NEURONTIN 300 MG capsule  Generic drug:  gabapentin  Take 300 mg by mouth every morning.     QUEtiapine 300 MG 24 hr tablet  Commonly known as:  SEROQUEL XR  Take 600 mg by mouth at bedtime.     ranitidine 300 MG capsule  Commonly known as:  ZANTAC  Take 300 mg by mouth every evening.     sitaGLIPtin 25 MG tablet  Commonly known as:  JANUVIA  Take 1 tablet (25 mg total) by mouth daily.  Start taking on:  05/10/2014          Diet and Activity recommendation: See Discharge Instructions above   Consults obtained - Renal   Major procedures and Radiology Reports - PLEASE review detailed and final reports for all details, in brief -       Ct Abdomen Pelvis Wo Contrast  05/04/2014   CLINICAL DATA:  Diarrhea for 1 week. Acute on chronic renal failure.  EXAM: CT ABDOMEN AND PELVIS WITHOUT CONTRAST  TECHNIQUE: Multidetector CT imaging of the abdomen and pelvis was performed following the standard protocol without IV contrast.  COMPARISON:  None.  FINDINGS: There is no pleural or pericardial effusion. Heart size is upper normal. Mild dependent atelectasis is noted.  No renal or ureteral stones are identified. Small parenchymal calcification with an overlying parenchymal defect is noted in the midpole of the right kidney compatible with scar. The urinary bladder is decompressed with a Foley catheter in place.  The adrenal glands, spleen, pancreas and gallbladder are unremarkable. Diffuse fatty infiltration of the liver is identified. No focal liver lesion or biliary ductal dilatation is seen. Aortoiliac atherosclerosis without aneurysm is noted.  The patient has a small umbilical hernia containing a knuckle of colon. There is no  obstruction other complicating feature. The stomach, small bowel and appendix appear normal. No lymphadenopathy or fluid is identified. No focal bony abnormality.  IMPRESSION: Negative for hydronephrosis or urinary tract stone. Small scar in the midpole the right kidney is noted.  Fatty infiltration.   Electronically Signed   By: Inge Rise M.D.   On: 05/04/2014 11:16   Dg Chest Port 1 View  05/03/2014   CLINICAL DATA:  Diarrhea for 6 days. Decreased urine output. Dehydration. Chronic renal disease. Nonsmoker.  EXAM: PORTABLE CHEST - 1 VIEW  COMPARISON:  06/28/2009  FINDINGS: Shallow inspiration. Heart size and pulmonary vascularity are normal for technique. No focal airspace disease or consolidation in the lungs. No blunting of costophrenic angles. No pneumothorax. Mediastinal contours appear intact.  IMPRESSION: No active disease.   Electronically Signed   By: Lucienne Capers M.D.   On: 05/03/2014 17:09    Micro Results      Recent Results (from the past 240 hour(s))  Culture, Urine     Status: None   Collection Time: 05/03/14  5:39 PM  Result Value Ref Range Status   Specimen Description URINE, CLEAN CATCH  Final   Special Requests NONE  Final   Colony Count NO GROWTH Performed at [facility]   Final   Culture NO GROWTH Performed at [facility]   Final   Report Status 05/05/2014 FINAL  Final  Clostridium Difficile by PCR     Status: None   Collection Time: 05/03/14  9:30 PM  Result Value Ref Range Status   C difficile by pcr NEGATIVE NEGATIVE Final       Today   Subjective:   Denise Serrano today has no headache,no chest abdominal pain,no new weakness tingling or numbness, feels much better wants to go home today.    Objective:   Blood pressure 132/65, pulse 92, temperature 98.4 F (36.9 C), temperature source Oral, resp. rate 19, weight 99.1 kg (218 lb 7.6 oz), SpO2 94 %.   Intake/Output Summary (Last 24 hours) at 05/06/14 0930 Last data  filed at 05/06/14 0926  Gross per 24 hour  Intake    120 ml  Output   2650 ml  Net  -2530 ml    Exam Awake Alert, Oriented x 3, No new F.N deficits, Normal affect [place].AT,PERRAL Supple Neck,No JVD, No cervical lymphadenopathy appriciated.  Symmetrical Chest wall movement, Good air movement bilaterally, CTAB RRR,No Gallops,Rubs or new Murmurs, No Parasternal Heave +ve B.Sounds, Abd Soft, Non tender, No organomegaly appriciated, No rebound -guarding or rigidity. No Cyanosis, Clubbing or edema, No new Rash or bruise  Data Review   CBC w Diff:  Lab Results  Component Value Date   WBC 8.1 05/04/2014   HGB 9.8* 05/04/2014   HCT 30.7* 05/04/2014   PLT 174 05/04/2014   LYMPHOPCT 34 05/04/2014   MONOPCT 7 05/04/2014   EOSPCT 7* 05/04/2014   BASOPCT 0 05/04/2014    CMP:  Lab Results  Component Value Date   NA 143 05/06/2014   K 5.0 05/06/2014   CL 115* 05/06/2014   CO2 17* 05/06/2014   BUN 69* 05/06/2014   CREATININE 5.30* 05/06/2014   PROT 5.3* 05/04/2014   PROT 7.2 11/14/2013   ALBUMIN 2.7* 05/04/2014   BILITOT 0.4 05/04/2014   ALKPHOS 110 05/04/2014   AST 13 05/04/2014   ALT 21 05/04/2014  . Lab Results  Component Value Date   HGBA1C 7.0* 05/03/2014   CBG (last 3)   Recent Labs  05/05/14 1701 05/05/14 2126 05/06/14 0742  GLUCAP 162* 153* 96     Total Time in preparing paper work, data evaluation and todays exam - 35 minutes  Lala Lund K M.D on 05/06/2014 at 9:30 AM  [facility] Office  [phone]

## 2014-05-06 NOTE — Progress Notes (Signed)
Admit: 05/03/2014 LOS: 3  57F CKD3 and AoCKD 2/2 hypovolemia/diarrhea and ACEi  Subjective:  Excellent UOP No diarrhea Eating normally Labs not yet returned this AM  02/13 0701 - 02/14 0700 In: 120 [P.O.:120] Out: 2500 [Urine:2500]  Filed Weights   05/03/14 1957 05/04/14 1900 05/05/14 0138  Weight: 97.6 kg (215 lb 2.7 oz) 99.1 kg (218 lb 7.6 oz) 99.1 kg (218 lb 7.6 oz)    Scheduled Meds: . aspirin EC  81 mg Oral Daily  . buPROPion  100 mg Oral q morning - 10a  . heparin  5,000 Units Subcutaneous 3 times per day  . insulin aspart  0-9 Units Subcutaneous TID WC  . levothyroxine  50 mcg Oral QAC breakfast  . multivitamin with minerals  1 tablet Oral Daily  . pantoprazole  40 mg Oral QHS  . sodium chloride  3 mL Intravenous Q12H   Continuous Infusions: . lactated ringers 125 mL/hr at 05/05/14 1345   PRN Meds:.acetaminophen **OR** [DISCONTINUED] acetaminophen, ALPRAZolam, morphine injection, [DISCONTINUED] ondansetron **OR** ondansetron (ZOFRAN) IV, sodium chloride, zolpidem  Current Labs: reviewed    Physical Exam:  Blood pressure 143/68, pulse 99, temperature 97.8 F (36.6 C), temperature source Oral, resp. rate 18, weight 99.1 kg (218 lb 7.6 oz), SpO2 95 %. Obese, NAD, reading on her Kindle RRR Diminished BS b/l No LEE No foley Nonfocal  A/P 1. AoCKD3 1. BL SCr 1.8-2.0 2. AKI etiology hypovolemia + ACEi 3. 2/12 CT A/P noncontrasted w/ no structural renal issues or HN 4. Improving with IVFs, good UOP 5. Expect full or near full recovery Daily weights, Daily Renal Panel, Strict I/Os, Avoid nephrotoxins (NSAIDs, judicious IV Contrast) If futher strong improvement in GFR today, ok with dc now that GI issues resolved and can arrange outpt labs at our office 1. Hypotension: resolved.  Rec IVFs 2. Acidosis: stable 3. Diarrhea: CT neg, w/u ongoing. C Diff negative None sicne arrival.   Pearson Grippe MD 05/06/2014, 7:30 AM   Recent Labs Lab 05/03/14 1443  05/03/14 2042 05/04/14 0500 05/05/14 0807  NA 137  --  136 138  K 4.5  --  4.5 4.5  CL 103  --  105 107  CO2 17*  --  21 20  GLUCOSE 127*  --  114* 86  BUN 93*  --  83* 73*  CREATININE 9.77* 9.25* 8.66* 6.45*  CALCIUM 9.1  --  8.3* 8.2*  PHOS  --   --  4.0  --     Recent Labs Lab 05/03/14 1443 05/03/14 2042 05/04/14 0500  WBC 9.8 10.0 8.1  NEUTROABS 6.3  --  4.1  HGB 12.2 10.2* 9.8*  HCT 37.7 31.6* 30.7*  MCV 94.5 93.5 93.3  PLT 194 178 174

## 2014-05-06 NOTE — Discharge Instructions (Signed)
Follow with Primary MD Hayden Rasmussen., MD in 3 days   Get CBC, CMP, 2 view Chest X ray checked  by Primary MD next visit.    Activity: As tolerated with Full fall precautions use walker/cane & assistance as needed   Disposition Home     Diet: Heart Healthy  Low Carb  Accuchecks 4 times/day, Once in AM empty stomach and then before each meal. Log in all results and show them to your Prim.MD in 3 days. If any glucose reading is under 80 or above 300 call your Prim MD immidiately. Follow Low glucose instructions for glucose under 80 as instructed.     On your next visit with your primary care physician please Get Medicines reviewed and adjusted.   Please request your Prim.MD to go over all Hospital Tests and Procedure/Radiological results at the follow up, please get all Hospital records sent to your Prim MD by signing hospital release before you go home.   If you experience worsening of your admission symptoms, develop shortness of breath, life threatening emergency, suicidal or homicidal thoughts you must seek medical attention immediately by calling 911 or calling your MD immediately  if symptoms less severe.  You Must read complete instructions/literature along with all the possible adverse reactions/side effects for all the Medicines you take and that have been prescribed to you. Take any new Medicines after you have completely understood and accpet all the possible adverse reactions/side effects.   Do not drive, operating heavy machinery, perform activities at heights, swimming or participation in water activities or provide baby sitting services if your were admitted for syncope or siezures until you have seen by Primary MD or a Neurologist and advised to do so again.  Do not drive when taking Pain medications.    Do not take more than prescribed Pain, Sleep and Anxiety Medications  Special Instructions: If you have smoked or chewed Tobacco  in the last 2 yrs please stop  smoking, stop any regular Alcohol  and or any Recreational drug use.  Wear Seat belts while driving.   Please note  You were cared for by a hospitalist during your hospital stay. If you have any questions about your discharge medications or the care you received while you were in the hospital after you are discharged, you can call the unit and asked to speak with the hospitalist on call if the hospitalist that took care of you is not available. Once you are discharged, your primary care physician will handle any further medical issues. Please note that NO REFILLS for any discharge medications will be authorized once you are discharged, as it is imperative that you return to your primary care physician (or establish a relationship with a primary care physician if you do not have one) for your aftercare needs so that they can reassess your need for medications and monitor your lab values.

## 2014-05-06 NOTE — Evaluation (Signed)
Physical Therapy Evaluation and discharge  Patient Details Name: Denise Serrano MRN: [id number] DOB: 1955-08-24 Today's Date: 05/06/2014   History of Present Illness  59 year old female with diabetes mellitus type 2 complicated by CK D stage III and peripheral neuropathy/essential hypertension/hyperlipidemia/hypothyroidism/bipolar disorder who presented to the emergency department with generalized weakness associated with copious nonbloody diarrhea that started about a week ago after patient ate a salad while she was out with her friend and she is found to have acute kidney injury with BUN 93 creatinine 9.77, from baseline creatinine ?1.7  Clinical Impression  Patient evaluated by Physical Therapy with no further acute PT needs identified. All education has been completed and the patient has no further questions. See below for any follow-up Physial Therapy or equipment needs. PT is signing off. Thank you for this referral.     Follow Up Recommendations No PT follow up;Supervision/Assistance - 24 hour    Equipment Recommendations  None recommended by PT    Recommendations for Other Services       Precautions / Restrictions Precautions Precautions: None Restrictions Weight Bearing Restrictions: No      Mobility  Bed Mobility Overal bed mobility: Independent             General bed mobility comments: bed flattened   Transfers Overall transfer level: Modified independent Equipment used: None             General transfer comment: good balance with transfers; no LOB noted  Ambulation/Gait Ambulation/Gait assistance: Modified independent (Device/Increase time) Ambulation Distance (Feet): 180 Feet Assistive device: None Gait Pattern/deviations: Step-through pattern;Decreased stride length Gait velocity: WFL Gait velocity interpretation: at or above normal speed for age/gender General Gait Details: pt SOB with mobility but reports at her baseline; one standing rest  break due to fatigue; no LOB noted  Stairs            Wheelchair Mobility    Modified Rankin (Stroke Patients Only)       Balance Overall balance assessment: Modified Independent;No apparent balance deficits (not formally assessed)                           High level balance activites: Direction changes;Head turns High Level Balance Comments: no LOB noted             Pertinent Vitals/Pain Pain Assessment: No/denies pain    Home Living Family/patient expects to be discharged to:: Private residence Living Arrangements: Spouse/significant other Available Help at Discharge: Family;Available 24 hours/day Type of Home: House Home Access: Ramped entrance     Home Layout: One level Home Equipment: None      Prior Function Level of Independence: Independent               Hand Dominance        Extremity/Trunk Assessment   Upper Extremity Assessment: Overall WFL for tasks assessed           Lower Extremity Assessment: Overall WFL for tasks assessed      Cervical / Trunk Assessment: Kyphotic  Communication   Communication: No difficulties  Cognition Arousal/Alertness: Awake/alert Behavior During Therapy: WFL for tasks assessed/performed Overall Cognitive Status: Within Functional Limits for tasks assessed                      General Comments      Exercises        Assessment/Plan    PT Assessment Patent does not need any  further PT services  PT Diagnosis Generalized weakness   PT Problem List    PT Treatment Interventions     PT Goals (Current goals can be found in the Care Plan section) Acute Rehab PT Goals Patient Stated Goal: home today PT Goal Formulation: All assessment and education complete, DC therapy    Frequency     Barriers to discharge        Co-evaluation               End of Session   Activity Tolerance: Patient tolerated treatment well Patient left: in bed;with call bell/phone  within reach Nurse Communication: Mobility status         Time: [id number] PT Time Calculation (min) (ACUTE ONLY): 10 min   Charges:   PT Evaluation $Initial PT Evaluation Tier I: 1 Procedure     PT G CodesGustavus Bryant, [place] 05/06/2014, 12:18 PM

## 2014-05-23 ENCOUNTER — Encounter (HOSPITAL_COMMUNITY): Payer: Self-pay | Admitting: Emergency Medicine

## 2014-05-23 ENCOUNTER — Emergency Department (HOSPITAL_COMMUNITY)
Admission: EM | Admit: 2014-05-23 | Discharge: 2014-05-23 | Disposition: A | Discharge status: Home / Self Care | Payer: Medicare Other | Attending: Emergency Medicine | Admitting: Emergency Medicine

## 2014-05-23 DIAGNOSIS — N183 Chronic kidney disease, stage 3 (moderate): Secondary | ICD-10-CM | POA: Diagnosis not present

## 2014-05-23 DIAGNOSIS — H269 Unspecified cataract: Secondary | ICD-10-CM | POA: Diagnosis not present

## 2014-05-23 DIAGNOSIS — F3160 Bipolar disorder, current episode mixed, unspecified: Secondary | ICD-10-CM | POA: Diagnosis not present

## 2014-05-23 DIAGNOSIS — Z79899 Other long term (current) drug therapy: Secondary | ICD-10-CM | POA: Diagnosis not present

## 2014-05-23 DIAGNOSIS — G4733 Obstructive sleep apnea (adult) (pediatric): Secondary | ICD-10-CM | POA: Diagnosis not present

## 2014-05-23 DIAGNOSIS — R790 Abnormal level of blood mineral: Secondary | ICD-10-CM | POA: Diagnosis present

## 2014-05-23 DIAGNOSIS — J45909 Unspecified asthma, uncomplicated: Secondary | ICD-10-CM | POA: Diagnosis not present

## 2014-05-23 DIAGNOSIS — Z9981 Dependence on supplemental oxygen: Secondary | ICD-10-CM | POA: Insufficient documentation

## 2014-05-23 DIAGNOSIS — R197 Diarrhea, unspecified: Secondary | ICD-10-CM | POA: Diagnosis not present

## 2014-05-23 DIAGNOSIS — M129 Arthropathy, unspecified: Secondary | ICD-10-CM | POA: Insufficient documentation

## 2014-05-23 DIAGNOSIS — H9190 Unspecified hearing loss, unspecified ear: Secondary | ICD-10-CM | POA: Diagnosis not present

## 2014-05-23 DIAGNOSIS — Z880 Allergy status to penicillin: Secondary | ICD-10-CM | POA: Diagnosis not present

## 2014-05-23 DIAGNOSIS — Z7982 Long term (current) use of aspirin: Secondary | ICD-10-CM | POA: Insufficient documentation

## 2014-05-23 DIAGNOSIS — N189 Chronic kidney disease, unspecified: Secondary | ICD-10-CM

## 2014-05-23 DIAGNOSIS — R899 Unspecified abnormal finding in specimens from other organs, systems and tissues: Secondary | ICD-10-CM

## 2014-05-23 LAB — CBC WITH DIFFERENTIAL/PLATELET
Basophils Absolute: 0.1 10*3/uL (ref 0.0–0.1)
Basophils Relative: 1 % (ref 0–1)
EOS PCT: 12 % — AB (ref 0–5)
Eosinophils Absolute: 1.2 10*3/uL — ABNORMAL HIGH (ref 0.0–0.7)
HEMATOCRIT: 38 % (ref 36.0–46.0)
Hemoglobin: 12.1 g/dL (ref 12.0–15.0)
LYMPHS ABS: 2.4 10*3/uL (ref 0.7–4.0)
LYMPHS PCT: 25 % (ref 12–46)
MCH: 30.5 pg (ref 26.0–34.0)
MCHC: 31.8 g/dL (ref 30.0–36.0)
MCV: 95.7 fL (ref 78.0–100.0)
Monocytes Absolute: 0.6 10*3/uL (ref 0.1–1.0)
Monocytes Relative: 6 % (ref 3–12)
NEUTROS ABS: 5.5 10*3/uL (ref 1.7–7.7)
Neutrophils Relative %: 56 % (ref 43–77)
Platelets: 216 10*3/uL (ref 150–400)
RBC: 3.97 MIL/uL (ref 3.87–5.11)
RDW: 14.2 % (ref 11.5–15.5)
WBC: 9.7 10*3/uL (ref 4.0–10.5)

## 2014-05-23 LAB — COMPREHENSIVE METABOLIC PANEL
ALBUMIN: 4 g/dL (ref 3.5–5.2)
ALT: 35 U/L (ref 0–35)
ANION GAP: 5 (ref 5–15)
AST: 26 U/L (ref 0–37)
Alkaline Phosphatase: 135 U/L — ABNORMAL HIGH (ref 39–117)
BILIRUBIN TOTAL: 0.4 mg/dL (ref 0.3–1.2)
BUN: 45 mg/dL — AB (ref 6–23)
CHLORIDE: 109 mmol/L (ref 96–112)
CO2: 26 mmol/L (ref 19–32)
CREATININE: 2.47 mg/dL — AB (ref 0.50–1.10)
Calcium: 10 mg/dL (ref 8.4–10.5)
GFR calc non Af Amer: 20 mL/min — ABNORMAL LOW (ref >90)
GFR, EST AFRICAN AMERICAN: 23 mL/min — AB (ref >90)
Glucose, Bld: 155 mg/dL — ABNORMAL HIGH (ref 70–99)
Potassium: 5 mmol/L (ref 3.5–5.1)
Sodium: 140 mmol/L (ref 135–145)
TOTAL PROTEIN: 7.6 g/dL (ref 6.0–8.3)

## 2014-05-23 NOTE — ED Notes (Signed)
Pt reports she was referred to ed for abnormal labs, also reports diarrhea since this am

## 2014-05-23 NOTE — ED Provider Notes (Signed)
CSN: [id number]     Arrival date & time 05/23/14  1046 History   First MD Initiated Contact with Patient 05/23/14 1101     Chief Complaint  Patient presents with  . Abnormal Lab     (Consider location/radiation/quality/duration/timing/severity/associated sxs/prior Treatment) The history is provided by the patient and medical records.    This is a 59 year old female with past medical history significant for bipolar disorder, asthma, sleep apnea on CPAP, chronic kidney disease, presenting to the ED for abnormal labs. She states she was notified this morning by her primary care physician that her calcium was elevated on recent lab work. Patient states overall she has been feeling well.  She did have a few loose stools this morning but not watery like prior diarrhea.  No melena or hematochezia. Does admit to taking stool softener and fiber vitamin earlier this morning.  She denies any nausea, vomiting, or abdominal pain. No fever or chills. No chest pain or shortness of breath. Of note, patient was hospitalized in February for acute renal failure secondary to dehydration from diarrhea.  C. Diff was sent at that time which was negative.  VSS on arrival to ED.  Past Medical History  Diagnosis Date  . Bipolar 1 disorder, mixed   . Impaired hearing hearing aid- bilateral  . Elevated blood pressure (not hypertension) borderline-  monitored  . Seasonal allergies   . Asthma   . Urgency of urination   . Nocturia   . OSA on CPAP   . Arthritis of knee, left   . Stress incontinence, female   . Cataract immature BILATERAL  . Chronic kidney disease (CKD), stage III (moderate) MONITORED BY DR Moshe Cipro   Past Surgical History  Procedure Laterality Date  . Tubal ligation  20 YRS AGO  . Dx laparoscopy for infertility  99 YRS AGO  . Bladder suspension  05/07/2011    Procedure: [place] PROCEDURE;  Surgeon: Reece Packer, MD;  Location: [facility];  Service: Urology;  Laterality: N/A;   cysto, sparc sling    Family History  Problem Relation Age of Onset  . Heart attack Mother   . Heart attack Father    History  Substance Use Topics  . Smoking status: Never Smoker   . Smokeless tobacco: Never Used  . Alcohol Use: Yes     Comment: social   OB History    No data available     Review of Systems  Constitutional:       Abnormal labs  Gastrointestinal: Positive for diarrhea.  All other systems reviewed and are negative.     Allergies  Amoxicillin  Home Medications   Prior to Admission medications   Medication Sig Start Date End Date Taking? Authorizing Provider  ALPRAZolam Duanne Moron) 0.5 MG tablet Take 0.5 mg by mouth 3 (three) times daily as needed for anxiety (anxiety). For anxiety.    Historical Provider, MD  aspirin EC 81 MG tablet Take 81 mg by mouth daily.    Historical Provider, MD  atorvastatin (LIPITOR) 20 MG tablet Take 20 mg by mouth at bedtime.     Historical Provider, MD  buPROPion (WELLBUTRIN SR) 150 MG 12 hr tablet Take 150 mg by mouth every morning.     Historical Provider, MD  cyclobenzaprine (FLEXERIL) 10 MG tablet Take 10 mg by mouth 3 (three) times daily as needed for muscle spasms (muscle spams).     Historical Provider, MD  gabapentin (NEURONTIN) 300 MG capsule Take 300 mg by  mouth every morning.     Historical Provider, MD  glimepiride (AMARYL) 1 MG tablet Take 1 tablet (1 mg total) by mouth daily with breakfast. 05/06/14   Thurnell Lose, MD  levothyroxine (SYNTHROID, LEVOTHROID) 50 MCG tablet Take 50 mcg by mouth daily.    Historical Provider, MD  montelukast (SINGULAIR) 10 MG tablet Take 10 mg by mouth at bedtime.    Historical Provider, MD  Multiple Vitamin (MULITIVITAMIN WITH MINERALS) TABS Take 1 tablet by mouth 2 (two) times daily.    Historical Provider, MD  QUEtiapine (SEROQUEL XR) 300 MG 24 hr tablet Take 600 mg by mouth at bedtime.    Historical Provider, MD  ranitidine (ZANTAC) 300 MG capsule Take 300 mg by mouth every evening.     Historical Provider, MD  sitaGLIPtin (JANUVIA) 25 MG tablet Take 1 tablet (25 mg total) by mouth daily. 05/10/14   Thurnell Lose, MD   BP 154/77 mmHg  Pulse 104  Temp(Src) 97.7 F (36.5 C) (Oral)  Resp 22  Ht 5' (1.524 m)  Wt 210 lb (95.255 kg)  BMI 41.01 kg/m2  SpO2 97%   Physical Exam  Constitutional: She is oriented to person, place, and time. She appears well-developed and well-nourished.  HENT:  Head: Normocephalic and atraumatic.  Mouth/Throat: Oropharynx is clear and moist.  Mucous membranes overall moist, tongue mildly dry  Eyes: Conjunctivae and EOM are normal. Pupils are equal, round, and reactive to light.  Neck: Normal range of motion.  Cardiovascular: Normal rate, regular rhythm and normal heart sounds.   Pulmonary/Chest: Effort normal and breath sounds normal.  Abdominal: Soft. Bowel sounds are normal.  Musculoskeletal: Normal range of motion.  Neurological: She is alert and oriented to person, place, and time.  Skin: Skin is warm and dry.  Skin turgor appropriate  Psychiatric: She has a normal mood and affect.  Nursing note and vitals reviewed.   ED Course  Procedures (including critical care time) Labs Review Labs Reviewed  CBC WITH DIFFERENTIAL/PLATELET - Abnormal; Notable for the following:    Eosinophils Relative 12 (*)    Eosinophils Absolute 1.2 (*)    All other components within normal limits  COMPREHENSIVE METABOLIC PANEL - Abnormal; Notable for the following:    Glucose, Bld 155 (*)    BUN 45 (*)    Creatinine, Ser 2.47 (*)    Alkaline Phosphatase 135 (*)    GFR calc non Af Amer 20 (*)    GFR calc Af Amer 23 (*)    All other components within normal limits    Imaging Review No results found.   EKG Interpretation None      MDM   Final diagnoses:  Abnormal laboratory test  Chronic kidney disease, unspecified stage   59 year old female here for abnormal lab, states she was notified by her PCP that her calcium was high on recent  blood draw. Of note, patient hospitalized last month for acute kidney injury secondary to dehydration. She does admit to loose bowel movements this morning, she did take stool softener and fiber vitamin. She had a bowel movement here in the ED, appears mostly formed without any gross blood. C. difficile culture from prior hospitalization reviewed and was negative. Patient without any fever or chills. She does not appear clinically dehydrated.  Basic labs were obtained here, calcium normal here at 10. Her serum creatinine remains elevated but has greatly improved from her hospitalization and appears to be trending down. She has previously scheduled follow-up with nephrology,  Dr. Moshe Cipro, neck week. I strongly encouraged her to keep this appointment for recheck of her renal function-- copies of lab work from today given for physician review. I've also suggested that she may wish to start taking stool softener every other day to avoid diarrhea.  Discussed plan with patient, he/she acknowledged understanding and agreed with plan of care.  Return precautions given for new or worsening symptoms.  Larene Pickett, PA-C 05/23/14 1349  Dorie Rank, MD 05/24/14 [phone]

## 2014-05-23 NOTE — Discharge Instructions (Signed)
Your calcium was normal on lab work today. Your kidney function continues to trend down as expected. Follow-up with Dr. Moshe Cipro next week as scheduled for recheck of your renal function. As we discussed, may wish to cut back on your stool softener to avoid diarrhea. May wish to use every other day instead of every day. Return to the ED as needed for new concerns.

## 2014-06-28 ENCOUNTER — Inpatient Hospital Stay (HOSPITAL_COMMUNITY)
Admission: EM | Admit: 2014-06-28 | Discharge: 2014-07-01 | DRG: 202 | Disposition: A | Discharge status: Home / Self Care | Payer: Medicare Other | Attending: Internal Medicine | Admitting: Internal Medicine

## 2014-06-28 ENCOUNTER — Encounter (HOSPITAL_COMMUNITY): Payer: Self-pay | Admitting: Emergency Medicine

## 2014-06-28 DIAGNOSIS — Z6841 Body Mass Index (BMI) 40.0 and over, adult: Secondary | ICD-10-CM

## 2014-06-28 DIAGNOSIS — N189 Chronic kidney disease, unspecified: Secondary | ICD-10-CM | POA: Diagnosis not present

## 2014-06-28 DIAGNOSIS — H9190 Unspecified hearing loss, unspecified ear: Secondary | ICD-10-CM | POA: Diagnosis present

## 2014-06-28 DIAGNOSIS — D631 Anemia in chronic kidney disease: Secondary | ICD-10-CM

## 2014-06-28 DIAGNOSIS — I129 Hypertensive chronic kidney disease with stage 1 through stage 4 chronic kidney disease, or unspecified chronic kidney disease: Secondary | ICD-10-CM | POA: Diagnosis present

## 2014-06-28 DIAGNOSIS — E1165 Type 2 diabetes mellitus with hyperglycemia: Secondary | ICD-10-CM | POA: Diagnosis present

## 2014-06-28 DIAGNOSIS — E1122 Type 2 diabetes mellitus with diabetic chronic kidney disease: Secondary | ICD-10-CM | POA: Diagnosis present

## 2014-06-28 DIAGNOSIS — E039 Hypothyroidism, unspecified: Secondary | ICD-10-CM | POA: Diagnosis present

## 2014-06-28 DIAGNOSIS — M1990 Unspecified osteoarthritis, unspecified site: Secondary | ICD-10-CM | POA: Diagnosis present

## 2014-06-28 DIAGNOSIS — E669 Obesity, unspecified: Secondary | ICD-10-CM | POA: Diagnosis present

## 2014-06-28 DIAGNOSIS — R42 Dizziness and giddiness: Secondary | ICD-10-CM

## 2014-06-28 DIAGNOSIS — N183 Chronic kidney disease, stage 3 (moderate): Secondary | ICD-10-CM | POA: Diagnosis present

## 2014-06-28 DIAGNOSIS — K210 Gastro-esophageal reflux disease with esophagitis: Secondary | ICD-10-CM

## 2014-06-28 DIAGNOSIS — R269 Unspecified abnormalities of gait and mobility: Secondary | ICD-10-CM

## 2014-06-28 DIAGNOSIS — H269 Unspecified cataract: Secondary | ICD-10-CM | POA: Diagnosis present

## 2014-06-28 DIAGNOSIS — R7881 Bacteremia: Secondary | ICD-10-CM | POA: Diagnosis present

## 2014-06-28 DIAGNOSIS — R059 Cough, unspecified: Secondary | ICD-10-CM

## 2014-06-28 DIAGNOSIS — I10 Essential (primary) hypertension: Secondary | ICD-10-CM | POA: Diagnosis not present

## 2014-06-28 DIAGNOSIS — F319 Bipolar disorder, unspecified: Secondary | ICD-10-CM | POA: Diagnosis present

## 2014-06-28 DIAGNOSIS — J9600 Acute respiratory failure, unspecified whether with hypoxia or hypercapnia: Secondary | ICD-10-CM | POA: Diagnosis present

## 2014-06-28 DIAGNOSIS — Z79899 Other long term (current) drug therapy: Secondary | ICD-10-CM

## 2014-06-28 DIAGNOSIS — E1142 Type 2 diabetes mellitus with diabetic polyneuropathy: Secondary | ICD-10-CM | POA: Diagnosis present

## 2014-06-28 DIAGNOSIS — G4733 Obstructive sleep apnea (adult) (pediatric): Secondary | ICD-10-CM | POA: Diagnosis present

## 2014-06-28 DIAGNOSIS — E785 Hyperlipidemia, unspecified: Secondary | ICD-10-CM | POA: Diagnosis present

## 2014-06-28 DIAGNOSIS — J45901 Unspecified asthma with (acute) exacerbation: Secondary | ICD-10-CM | POA: Diagnosis present

## 2014-06-28 DIAGNOSIS — R0602 Shortness of breath: Secondary | ICD-10-CM | POA: Diagnosis present

## 2014-06-28 DIAGNOSIS — Z7982 Long term (current) use of aspirin: Secondary | ICD-10-CM | POA: Diagnosis not present

## 2014-06-28 DIAGNOSIS — R05 Cough: Secondary | ICD-10-CM

## 2014-06-28 DIAGNOSIS — N179 Acute kidney failure, unspecified: Secondary | ICD-10-CM | POA: Insufficient documentation

## 2014-06-28 DIAGNOSIS — K219 Gastro-esophageal reflux disease without esophagitis: Secondary | ICD-10-CM | POA: Diagnosis present

## 2014-06-28 DIAGNOSIS — F419 Anxiety disorder, unspecified: Secondary | ICD-10-CM | POA: Diagnosis present

## 2014-06-28 DIAGNOSIS — E872 Acidosis, unspecified: Secondary | ICD-10-CM

## 2014-06-28 HISTORY — DX: Type 2 diabetes mellitus without complications: E11.9

## 2014-06-28 LAB — COMPREHENSIVE METABOLIC PANEL
ALBUMIN: 3.5 g/dL (ref 3.5–5.2)
ALK PHOS: 124 U/L — AB (ref 39–117)
ALT: 21 U/L (ref 0–35)
AST: 20 U/L (ref 0–37)
Anion gap: 15 (ref 5–15)
BILIRUBIN TOTAL: 0.2 mg/dL — AB (ref 0.3–1.2)
BUN: 75 mg/dL — ABNORMAL HIGH (ref 6–23)
CO2: 15 mmol/L — ABNORMAL LOW (ref 19–32)
CREATININE: 3.85 mg/dL — AB (ref 0.50–1.10)
Calcium: 8.5 mg/dL (ref 8.4–10.5)
Chloride: 103 mmol/L (ref 96–112)
GFR calc Af Amer: 14 mL/min — ABNORMAL LOW (ref >90)
GFR calc non Af Amer: 12 mL/min — ABNORMAL LOW (ref >90)
Glucose, Bld: 62 mg/dL — ABNORMAL LOW (ref 70–99)
POTASSIUM: 4.4 mmol/L (ref 3.5–5.1)
Sodium: 133 mmol/L — ABNORMAL LOW (ref 135–145)
Total Protein: 6.7 g/dL (ref 6.0–8.3)

## 2014-06-28 LAB — CBC WITH DIFFERENTIAL/PLATELET
Basophils Absolute: 0.1 10*3/uL (ref 0.0–0.1)
Basophils Relative: 1 % (ref 0–1)
Eosinophils Absolute: 0.2 10*3/uL (ref 0.0–0.7)
Eosinophils Relative: 2 % (ref 0–5)
HCT: 36.1 % (ref 36.0–46.0)
Hemoglobin: 11.8 g/dL — ABNORMAL LOW (ref 12.0–15.0)
LYMPHS ABS: 2.4 10*3/uL (ref 0.7–4.0)
Lymphocytes Relative: 24 % (ref 12–46)
MCH: 30.2 pg (ref 26.0–34.0)
MCHC: 32.7 g/dL (ref 30.0–36.0)
MCV: 92.3 fL (ref 78.0–100.0)
MONOS PCT: 9 % (ref 3–12)
Monocytes Absolute: 0.9 10*3/uL (ref 0.1–1.0)
NEUTROS PCT: 64 % (ref 43–77)
Neutro Abs: 6.4 10*3/uL (ref 1.7–7.7)
PLATELETS: 200 10*3/uL (ref 150–400)
RBC: 3.91 MIL/uL (ref 3.87–5.11)
RDW: 14.3 % (ref 11.5–15.5)
WBC: 9.9 10*3/uL (ref 4.0–10.5)

## 2014-06-28 LAB — GLUCOSE, CAPILLARY: Glucose-Capillary: 151 mg/dL — ABNORMAL HIGH (ref 70–99)

## 2014-06-28 LAB — CBG MONITORING, ED: Glucose-Capillary: 229 mg/dL — ABNORMAL HIGH (ref 70–99)

## 2014-06-28 MED ORDER — DOXYCYCLINE HYCLATE 100 MG PO TABS
100.0000 mg | ORAL_TABLET | Freq: Two times a day (BID) | ORAL | Status: DC
  Administered 2014-06-29 – 2014-06-30 (×4): 100 mg via ORAL
  Filled 2014-06-28 (×5): qty 1

## 2014-06-28 MED ORDER — SODIUM CHLORIDE 0.9 % IV BOLUS (SEPSIS)
1000.0000 mL | Freq: Once | INTRAVENOUS | Status: AC
  Administered 2014-06-28: 1000 mL via INTRAVENOUS

## 2014-06-28 MED ORDER — LEVOTHYROXINE SODIUM 50 MCG PO TABS
50.0000 ug | ORAL_TABLET | Freq: Every day | ORAL | Status: DC
  Administered 2014-06-29 – 2014-07-01 (×3): 50 ug via ORAL
  Filled 2014-06-28 (×5): qty 1

## 2014-06-28 MED ORDER — DM-GUAIFENESIN ER 30-600 MG PO TB12
1.0000 | ORAL_TABLET | Freq: Two times a day (BID) | ORAL | Status: DC
  Administered 2014-06-29 – 2014-07-01 (×6): 1 via ORAL
  Filled 2014-06-28 (×8): qty 1

## 2014-06-28 MED ORDER — SODIUM CHLORIDE 0.9 % IV SOLN: INTRAVENOUS | Status: AC

## 2014-06-28 MED ORDER — ASPIRIN EC 81 MG PO TBEC
81.0000 mg | DELAYED_RELEASE_TABLET | Freq: Every day | ORAL | Status: DC
  Administered 2014-06-29 – 2014-07-01 (×3): 81 mg via ORAL
  Filled 2014-06-28 (×3): qty 1

## 2014-06-28 MED ORDER — ALPRAZOLAM 0.5 MG PO TABS
0.5000 mg | ORAL_TABLET | Freq: Three times a day (TID) | ORAL | Status: DC | PRN
  Administered 2014-06-29 – 2014-07-01 (×4): 0.5 mg via ORAL
  Filled 2014-06-28 (×4): qty 1

## 2014-06-28 MED ORDER — OMEGA-3-ACID ETHYL ESTERS 1 G PO CAPS
1.0000 g | ORAL_CAPSULE | Freq: Every day | ORAL | Status: DC
  Administered 2014-06-29 – 2014-07-01 (×3): 1 g via ORAL
  Filled 2014-06-28 (×3): qty 1

## 2014-06-28 MED ORDER — CYCLOBENZAPRINE HCL 10 MG PO TABS
10.0000 mg | ORAL_TABLET | Freq: Three times a day (TID) | ORAL | Status: DC | PRN
  Administered 2014-06-30: 10 mg via ORAL
  Filled 2014-06-28: qty 1

## 2014-06-28 MED ORDER — MONTELUKAST SODIUM 5 MG PO CHEW
5.0000 mg | CHEWABLE_TABLET | Freq: Every day | ORAL | Status: DC
  Administered 2014-06-29 – 2014-07-01 (×3): 5 mg via ORAL
  Filled 2014-06-28 (×3): qty 1

## 2014-06-28 MED ORDER — FAMOTIDINE 40 MG PO TABS
40.0000 mg | ORAL_TABLET | Freq: Every day | ORAL | Status: DC
  Administered 2014-06-29 – 2014-06-30 (×3): 40 mg via ORAL
  Filled 2014-06-28 (×4): qty 1

## 2014-06-28 MED ORDER — METHYLPREDNISOLONE SODIUM SUCC 125 MG IJ SOLR
60.0000 mg | Freq: Two times a day (BID) | INTRAMUSCULAR | Status: DC
  Administered 2014-06-29 (×2): 60 mg via INTRAVENOUS
  Filled 2014-06-28: qty 0.96
  Filled 2014-06-28: qty 2
  Filled 2014-06-28: qty 0.96

## 2014-06-28 MED ORDER — INSULIN ASPART 100 UNIT/ML ~~LOC~~ SOLN
0.0000 [IU] | Freq: Three times a day (TID) | SUBCUTANEOUS | Status: DC
  Administered 2014-06-29: 5 [IU] via SUBCUTANEOUS
  Administered 2014-06-29: 7 [IU] via SUBCUTANEOUS
  Administered 2014-06-29: 3 [IU] via SUBCUTANEOUS

## 2014-06-28 MED ORDER — IPRATROPIUM-ALBUTEROL 0.5-2.5 (3) MG/3ML IN SOLN
3.0000 mL | RESPIRATORY_TRACT | Status: DC
  Administered 2014-06-29: 3 mL via RESPIRATORY_TRACT
  Filled 2014-06-28: qty 3

## 2014-06-28 MED ORDER — QUETIAPINE FUMARATE ER 300 MG PO TB24
600.0000 mg | ORAL_TABLET | Freq: Every day | ORAL | Status: DC
  Administered 2014-06-29 – 2014-06-30 (×3): 600 mg via ORAL
  Filled 2014-06-28 (×4): qty 2

## 2014-06-28 MED ORDER — ATORVASTATIN CALCIUM 20 MG PO TABS
20.0000 mg | ORAL_TABLET | Freq: Every day | ORAL | Status: DC
  Administered 2014-06-29 – 2014-06-30 (×3): 20 mg via ORAL
  Filled 2014-06-28 (×4): qty 1

## 2014-06-28 MED ORDER — GABAPENTIN 300 MG PO CAPS
300.0000 mg | ORAL_CAPSULE | Freq: Two times a day (BID) | ORAL | Status: DC
  Administered 2014-06-29 – 2014-07-01 (×6): 300 mg via ORAL
  Filled 2014-06-28 (×7): qty 1

## 2014-06-28 MED ORDER — HEPARIN SODIUM (PORCINE) 5000 UNIT/ML IJ SOLN
5000.0000 [IU] | Freq: Three times a day (TID) | INTRAMUSCULAR | Status: DC
  Administered 2014-06-29 – 2014-06-30 (×3): 5000 [IU] via SUBCUTANEOUS
  Filled 2014-06-28 (×9): qty 1

## 2014-06-28 MED ORDER — BUPROPION HCL ER (SR) 150 MG PO TB12
150.0000 mg | ORAL_TABLET | Freq: Every morning | ORAL | Status: DC
  Administered 2014-06-29 – 2014-07-01 (×3): 150 mg via ORAL
  Filled 2014-06-28 (×3): qty 1

## 2014-06-28 MED ORDER — DEXTROSE 50 % IV SOLN: 25.0000 mL | INTRAVENOUS | Status: DC | PRN

## 2014-06-28 MED ORDER — ALBUTEROL SULFATE (2.5 MG/3ML) 0.083% IN NEBU: 2.5000 mg | INHALATION_SOLUTION | RESPIRATORY_TRACT | Status: DC | PRN

## 2014-06-28 NOTE — Progress Notes (Signed)
Report received from ED nurse on PT,awaiting arrival to floor.Rockie Neighbours RN

## 2014-06-28 NOTE — ED Provider Notes (Signed)
CSN: [id number]     Arrival date & time 06/28/14  1603 History   First MD Initiated Contact with Patient 06/28/14 1825     Chief Complaint  Patient presents with  . Dizziness     (Consider location/radiation/quality/duration/timing/severity/associated sxs/prior Treatment) HPI Comments: Patient is a 59 year old female with history of diabetes and end-stage renal disease. She presents today for evaluation of weakness and feeling off balance. She feels as though she is urinating less. This is how she felt when she had prior renal failure and is concerned her kidneys may not be working properly. She denies any fevers or chills. She denies any dysuria.  Patient is a 59 y.o. female presenting with dizziness. The history is provided by the patient.  Dizziness Quality:  Imbalance Severity:  Moderate Onset quality:  Sudden Duration:  24 hours Timing:  Constant Progression:  Worsening Chronicity:  New Relieved by:  Nothing Worsened by:  Nothing Ineffective treatments:  None tried   Past Medical History  Diagnosis Date  . Bipolar 1 disorder, mixed   . Impaired hearing hearing aid- bilateral  . Elevated blood pressure (not hypertension) borderline-  monitored  . Seasonal allergies   . Asthma   . Urgency of urination   . Nocturia   . OSA on CPAP   . Arthritis of knee, left   . Stress incontinence, female   . Cataract immature BILATERAL  . Chronic kidney disease (CKD), stage III (moderate) MONITORED BY DR GOLDSBOROUGH  . Diabetes mellitus without complication    Past Surgical History  Procedure Laterality Date  . Tubal ligation  20 YRS AGO  . Dx laparoscopy for infertility  85 YRS AGO  . Bladder suspension  05/07/2011    Procedure: [place] PROCEDURE;  Surgeon: Reece Packer, MD;  Location: [facility];  Service: Urology;  Laterality: N/A;  cysto, sparc sling    Family History  Problem Relation Age of Onset  . Heart attack Mother   . Heart attack Father     History  Substance Use Topics  . Smoking status: Never Smoker   . Smokeless tobacco: Never Used  . Alcohol Use: Yes     Comment: social   OB History    No data available     Review of Systems  Neurological: Positive for dizziness.  All other systems reviewed and are negative.     Allergies  Amoxicillin  Home Medications   Prior to Admission medications   Medication Sig Start Date End Date Taking? Authorizing Provider  ALPRAZolam Duanne Moron) 0.5 MG tablet Take 0.5 mg by mouth 3 (three) times daily as needed for anxiety (anxiety). For anxiety.    Historical Provider, MD  aspirin EC 81 MG tablet Take 81 mg by mouth daily.    Historical Provider, MD  atorvastatin (LIPITOR) 20 MG tablet Take 20 mg by mouth at bedtime.     Historical Provider, MD  budesonide-formoterol (SYMBICORT) 160-4.5 MCG/ACT inhaler Inhale 2 puffs into the lungs 2 (two) times daily.    Historical Provider, MD  buPROPion (WELLBUTRIN SR) 150 MG 12 hr tablet Take 150 mg by mouth every morning.     Historical Provider, MD  cyclobenzaprine (FLEXERIL) 10 MG tablet Take 10 mg by mouth 3 (three) times daily as needed for muscle spasms (muscle spams).     Historical Provider, MD  Docusate Calcium (STOOL SOFTENER PO) Take 1 tablet by mouth as needed (constipation).    Historical Provider, MD  FIBER SELECT GUMMIES PO Take 1  Dose by mouth daily.    Historical Provider, MD  gabapentin (NEURONTIN) 300 MG capsule Take 300 mg by mouth every morning.     Historical Provider, MD  glimepiride (AMARYL) 1 MG tablet Take 1 tablet (1 mg total) by mouth daily with breakfast. 05/06/14   Thurnell Lose, MD  levothyroxine (SYNTHROID, LEVOTHROID) 50 MCG tablet Take 50 mcg by mouth daily.    Historical Provider, MD  montelukast (SINGULAIR) 10 MG tablet Take 10 mg by mouth at bedtime.    Historical Provider, MD  Multiple Vitamin (MULITIVITAMIN WITH MINERALS) TABS Take 1 tablet by mouth 2 (two) times daily.    Historical Provider, MD   QUEtiapine (SEROQUEL XR) 300 MG 24 hr tablet Take 600 mg by mouth at bedtime.    Historical Provider, MD  ranitidine (ZANTAC) 300 MG capsule Take 300 mg by mouth every evening.    Historical Provider, MD  sitaGLIPtin (JANUVIA) 25 MG tablet Take 1 tablet (25 mg total) by mouth daily. 05/10/14   Thurnell Lose, MD   BP 107/55 mmHg  Pulse 87  Temp(Src) 97.5 F (36.4 C) (Oral)  Resp 16  Ht 5' (1.524 m)  Wt 208 lb (94.348 kg)  BMI 40.62 kg/m2  SpO2 100% Physical Exam  Constitutional: She is oriented to person, place, and time. She appears well-developed and well-nourished. No distress.  HENT:  Head: Normocephalic and atraumatic.  Mouth/Throat: Oropharynx is clear and moist.  Neck: Normal range of motion. Neck supple.  Cardiovascular: Normal rate and regular rhythm.  Exam reveals no gallop and no friction rub.   No murmur heard. Pulmonary/Chest: Effort normal and breath sounds normal. No respiratory distress. She has no wheezes.  Abdominal: Soft. Bowel sounds are normal. She exhibits no distension. There is no tenderness.  Musculoskeletal: Normal range of motion. She exhibits no edema.  Neurological: She is alert and oriented to person, place, and time.  Skin: Skin is warm and dry. She is not diaphoretic.  Nursing note and vitals reviewed.   ED Course  Procedures (including critical care time) Labs Review Labs Reviewed  CBG MONITORING, ED - Abnormal; Notable for the following:    Glucose-Capillary 229 (*)    All other components within normal limits  CBC WITH DIFFERENTIAL/PLATELET  COMPREHENSIVE METABOLIC PANEL    Imaging Review No results found.   EKG Interpretation   Date/Time:  Thursday June 28 2014 18:54:30 EDT Ventricular Rate:  89 PR Interval:  168 QRS Duration: 86 QT Interval:  453 QTC Calculation: 551 R Axis:   95 Text Interpretation:  Atrial-paced complexes Borderline right axis  deviation Low voltage, precordial leads Prolonged QT interval Confirmed by   Beau Fanny  MD, Eaden Hettinger ([id number]) on 06/28/2014 7:50:15 PM      MDM   Final diagnoses:  None    Patient is a 59 year old female with history of diabetes and renal insufficiency. She presents with weakness and fatigue. She also feels dizzy and off balance. She is concerned her kidney function may be worsening. Her workup reveals an unchanged EKG, but electrolytes reveal BUN and creatinine elevated above baseline. She is acidotic with a CO2 of 15. She was hydrated with normal saline here in the ER and I believe will require admission for hydration and close monitoring of her renal function. I've spoken with Dr. Blaine Hamper from the hospitalist service who agrees to admit.    Veryl Speak, MD 06/28/14 2108

## 2014-06-28 NOTE — ED Notes (Signed)
Pt c/o feeling dizzy and off balance.  Pt st's she ate a hamburger earlier today then checked her blood sugar and it was 215.  St's then she began to feel dizzy

## 2014-06-28 NOTE — H&P (Signed)
[facility] History and Physical  Gracianna Shytle [id number] DOB: [birth year] DOA: 06/28/2014  Referring physician: ED physician PCP: Suzanna Obey, MD  Specialists:   Chief Complaint: Cough, shortness of breath and generalized weakness  HPI: Denise Serrano is a 59 y.o. female with past medical history of asthma, hypertension not on medications, hyperlipidemia, GERD, hypothyroidism, bipolar, OSA on CPAP, chronic kidney disease-III, diabetes mellitus, who presents with productive cough, shortness of breath and generalized weakness.  Patient reports that she has been having cough and shortness of breath for almost a month. She was treated with a course of antibiotics for 10 days by her sleep apnea doctor (patient cannot tell the detail). She completed treatment, but still has productive cough, shortness of breath. She coughs up yellow colored sputum. Patient does not have chest pain, fever or chills. Patient reports that her partner has bronchitis recently. She reports that she feels very weak recently and has a generalized weakness. She has dizziness and poor balance, but no fall. No unilateral weakness, numbness or tingling sensations.  Patient denies fever, chills, abdominal pain, diarrhea, constipation, dysuria, urgency, frequency, hematuria, skin rashes or leg swelling. No vision change or hearing loss.  In ED, patient was found to have  Review of Systems: As presented in the history of presenting illness, rest negative.  Where does patient live? At home  Can patient participate in ADLs? Yes  Allergy:  Allergies  Allergen Reactions  . Amoxicillin Diarrhea    Past Medical History  Diagnosis Date  . Bipolar 1 disorder, mixed   . Impaired hearing hearing aid- bilateral  . Elevated blood pressure (not hypertension) borderline-  monitored  . Seasonal allergies   . Asthma   . Urgency of urination   . Nocturia   . OSA on CPAP   . Arthritis of knee, left   . Stress  incontinence, female   . Cataract immature BILATERAL  . Chronic kidney disease (CKD), stage III (moderate) MONITORED BY DR GOLDSBOROUGH  . Diabetes mellitus without complication     Past Surgical History  Procedure Laterality Date  . Tubal ligation  20 YRS AGO  . Dx laparoscopy for infertility  80 YRS AGO  . Bladder suspension  05/07/2011    Procedure: [place] PROCEDURE;  Surgeon: Reece Packer, MD;  Location: [facility];  Service: Urology;  Laterality: N/A;  cysto, sparc sling     Social History:  reports that she has never smoked. She has never used smokeless tobacco. She reports that she drinks alcohol. She reports that she does not use illicit drugs.  Family History:  Family History  Problem Relation Age of Onset  . Heart attack Mother   . Heart attack Father      Prior to Admission medications   Medication Sig Start Date End Date Taking? Authorizing Provider  ALPRAZolam Duanne Moron) 0.5 MG tablet Take 0.5 mg by mouth 3 (three) times daily as needed for anxiety (anxiety). For anxiety.    Historical Provider, MD  aspirin EC 81 MG tablet Take 81 mg by mouth daily.    Historical Provider, MD  atorvastatin (LIPITOR) 20 MG tablet Take 20 mg by mouth at bedtime.     Historical Provider, MD  budesonide-formoterol (SYMBICORT) 160-4.5 MCG/ACT inhaler Inhale 2 puffs into the lungs 2 (two) times daily.    Historical Provider, MD  buPROPion (WELLBUTRIN SR) 150 MG 12 hr tablet Take 150 mg by mouth every morning.     Historical Provider, MD  cyclobenzaprine (FLEXERIL) 10  MG tablet Take 10 mg by mouth 3 (three) times daily as needed for muscle spasms (muscle spams).     Historical Provider, MD  Docusate Calcium (STOOL SOFTENER PO) Take 1 tablet by mouth as needed (constipation).    Historical Provider, MD  FIBER SELECT GUMMIES PO Take 1 Dose by mouth daily.    Historical Provider, MD  gabapentin (NEURONTIN) 300 MG capsule Take 300 mg by mouth every morning.     Historical Provider,  MD  glimepiride (AMARYL) 1 MG tablet Take 1 tablet (1 mg total) by mouth daily with breakfast. 05/06/14   Thurnell Lose, MD  levothyroxine (SYNTHROID, LEVOTHROID) 50 MCG tablet Take 50 mcg by mouth daily.    Historical Provider, MD  montelukast (SINGULAIR) 10 MG tablet Take 10 mg by mouth at bedtime.    Historical Provider, MD  Multiple Vitamin (MULITIVITAMIN WITH MINERALS) TABS Take 1 tablet by mouth 2 (two) times daily.    Historical Provider, MD  QUEtiapine (SEROQUEL XR) 300 MG 24 hr tablet Take 600 mg by mouth at bedtime.    Historical Provider, MD  ranitidine (ZANTAC) 300 MG capsule Take 300 mg by mouth every evening.    Historical Provider, MD  sitaGLIPtin (JANUVIA) 25 MG tablet Take 1 tablet (25 mg total) by mouth daily. 05/10/14   Thurnell Lose, MD    Physical Exam: Filed Vitals:   06/28/14 2024 06/28/14 2130 06/28/14 2200 06/28/14 2251  BP: 106/57 93/49 94/51  111/49  Pulse: 87 79 83 87  Temp:      TempSrc:    Oral  Resp: 17 13 14 19   Height:    5' (1.524 m)  Weight:    95.1 kg (209 lb 10.5 oz)  SpO2: 94% 92% 94% 100%   General: Not in acute distress HEENT:       Eyes: PERRL, EOMI, no scleral icterus       ENT: No discharge from the ears and nose, no pharynx injection, no tonsillar enlargement.        Neck: No JVD, no bruit, no mass felt. Cardiac: S1/S2, RRR, No murmurs, No gallops or rubs Pulm: Bilateral wheezing, no rales Abd: Soft, nondistended, nontender, no rebound pain, no organomegaly, BS present Ext: No edema bilaterally. 2+DP/PT pulse bilaterally Musculoskeletal: No joint deformities, erythema, or stiffness, ROM full Skin: No rashes.  Neuro: Alert and oriented X3, cranial nerves II-XII grossly intact, muscle strength 4/5 in all extremities with poor effort, sensation to light touch intact. Brachial reflex 2+ bilaterally. Knee reflex 1+ bilaterally. Negative Babinski's sign. Normal finger to nose test. Psych: Patient is not psychotic, no suicidal or hemocidal  ideation.  Labs on Admission:  Basic Metabolic Panel:  Recent Labs Lab 06/28/14 1952  NA 133*  K 4.4  CL 103  CO2 15*  GLUCOSE 62*  BUN 75*  CREATININE 3.85*  CALCIUM 8.5   Liver Function Tests:  Recent Labs Lab 06/28/14 1952  AST 20  ALT 21  ALKPHOS 124*  BILITOT 0.2*  PROT 6.7  ALBUMIN 3.5   No results for input(s): LIPASE, AMYLASE in the last 168 hours. No results for input(s): AMMONIA in the last 168 hours. CBC:  Recent Labs Lab 06/28/14 1952  WBC 9.9  NEUTROABS 6.4  HGB 11.8*  HCT 36.1  MCV 92.3  PLT 200   Cardiac Enzymes: No results for input(s): CKTOTAL, CKMB, CKMBINDEX, TROPONINI in the last 168 hours.  BNP (last 3 results) No results for input(s): BNP in the last 8760 hours.  ProBNP (last 3 results) No results for input(s): PROBNP in the last 8760 hours.  CBG:  Recent Labs Lab 06/28/14 1625 06/28/14 2343  GLUCAP 229* 151*    Radiological Exams on Admission: No results found.  EKG: Independently reviewed. QT interval 551, nonspecific T-wave changes  Assessment/Plan Principal Problem:   Asthma exacerbation Active Problems:   Type 2 diabetes mellitus with diabetic chronic kidney disease   Diabetic polyneuropathy associated with type 2 diabetes mellitus   Gait difficulty   Bipolar disorder   Essential hypertension   Hypothyroidism   Hyperlipidemia   Dizziness   GERD (gastroesophageal reflux disease)  Asthma exacerbation: Patient's productive cough, shortness of breath and wheezing are consistent with asthma exacerbation. May also have bronchitis component. -will admit patient to telemetry bed  -Nebulizers: scheduled Duoneb and prn albuterol -Solu-Medrol 60 mg IV bid -Oral doxycycline -Continue Singulair   -Mucinex for cough  -HIV Ab -Urine legionella and S. pneumococcal antigen -Follow up blood culture x2, sputum culture, respiratory virus panel, Flu pcr -follow up CXR 2 view  Dizziness and generalized weakness: Likely  due to dehydration. -IV fluid: 1 L normal saline, followed by 1 25 mL per hour -Check orthostatic signs  Diabetes mellitus: A1c was 7.0 on 05/03/14. Patient is taking Amaryl and Januvia at home. -Sliding-scale insulin  CKD-III: Her baseline creatinine is between about 2 to 2.5. Her cre is 3.85, BUN 75 on admission, likely due to prerenal failure. UA negative. -IV fluid as above. -FeNa and US-renal  Hypothyroidism: TSH was 0.865 on 05/04/14 -Continue Synthroid next  Hyperlipidemia: LDL was a 34 on 05/04/14 -Continue Lipitor  GERD:  -Pepcid  Bipolar and anxiety: Stable. No suicidal or homicidal ideations.  -continue Seroquel, Wellbutrin and Xanax when necessary    DVT ppx: SQ Heparin       Code Status: Full code Family Communication:   Yes, patient's  partner  at bed side Disposition Plan: Admit to inpatient   Date of Service 06/29/2014    Ivor Costa [facility] Pager [phone]  If 7PM-7AM, please contact night-coverage [email or web address] Password [facility] 06/29/2014, 3:34 AM

## 2014-06-29 ENCOUNTER — Inpatient Hospital Stay (HOSPITAL_COMMUNITY): Payer: Medicare Other

## 2014-06-29 ENCOUNTER — Encounter (HOSPITAL_COMMUNITY): Payer: Self-pay | Admitting: *Deleted

## 2014-06-29 DIAGNOSIS — E1122 Type 2 diabetes mellitus with diabetic chronic kidney disease: Secondary | ICD-10-CM

## 2014-06-29 DIAGNOSIS — N189 Chronic kidney disease, unspecified: Secondary | ICD-10-CM

## 2014-06-29 LAB — URINALYSIS, ROUTINE W REFLEX MICROSCOPIC
BILIRUBIN URINE: NEGATIVE
Glucose, UA: NEGATIVE mg/dL
HGB URINE DIPSTICK: NEGATIVE
KETONES UR: NEGATIVE mg/dL
Leukocytes, UA: NEGATIVE
NITRITE: NEGATIVE
PROTEIN: NEGATIVE mg/dL
SPECIFIC GRAVITY, URINE: 1.008 (ref 1.005–1.030)
UROBILINOGEN UA: 0.2 mg/dL (ref 0.0–1.0)
pH: 6 (ref 5.0–8.0)

## 2014-06-29 LAB — GLUCOSE, CAPILLARY
GLUCOSE-CAPILLARY: 321 mg/dL — AB (ref 70–99)
Glucose-Capillary: 224 mg/dL — ABNORMAL HIGH (ref 70–99)
Glucose-Capillary: 298 mg/dL — ABNORMAL HIGH (ref 70–99)
Glucose-Capillary: 186 mg/dL — ABNORMAL HIGH (ref 70–99)

## 2014-06-29 LAB — STREP PNEUMONIAE URINARY ANTIGEN: Strep Pneumo Urinary Antigen: NEGATIVE

## 2014-06-29 LAB — INFLUENZA PANEL BY PCR (TYPE A & B)
H1N1 flu by pcr: NOT DETECTED
Influenza A By PCR: NEGATIVE
Influenza B By PCR: NEGATIVE

## 2014-06-29 LAB — PROTIME-INR
INR: 1.07 (ref 0.00–1.49)
Prothrombin Time: 14 seconds (ref 11.6–15.2)

## 2014-06-29 LAB — HIV ANTIBODY (ROUTINE TESTING W REFLEX): HIV Screen 4th Generation wRfx: NONREACTIVE

## 2014-06-29 MED ORDER — INSULIN ASPART 100 UNIT/ML ~~LOC~~ SOLN: 0.0000 [IU] | Freq: Every day | SUBCUTANEOUS | Status: DC

## 2014-06-29 MED ORDER — PREDNISONE 50 MG PO TABS
60.0000 mg | ORAL_TABLET | Freq: Every day | ORAL | Status: DC
  Administered 2014-06-30: 60 mg via ORAL
  Filled 2014-06-29 (×2): qty 1

## 2014-06-29 MED ORDER — IPRATROPIUM-ALBUTEROL 0.5-2.5 (3) MG/3ML IN SOLN
3.0000 mL | Freq: Two times a day (BID) | RESPIRATORY_TRACT | Status: DC
  Administered 2014-06-29 – 2014-07-01 (×4): 3 mL via RESPIRATORY_TRACT
  Filled 2014-06-29 (×5): qty 3

## 2014-06-29 MED ORDER — INSULIN ASPART 100 UNIT/ML ~~LOC~~ SOLN
0.0000 [IU] | Freq: Three times a day (TID) | SUBCUTANEOUS | Status: DC
  Administered 2014-06-30: 11 [IU] via SUBCUTANEOUS
  Administered 2014-06-30 – 2014-07-01 (×2): 4 [IU] via SUBCUTANEOUS
  Administered 2014-07-01: 3 [IU] via SUBCUTANEOUS

## 2014-06-29 MED ORDER — INSULIN ASPART 100 UNIT/ML ~~LOC~~ SOLN
2.0000 [IU] | Freq: Three times a day (TID) | SUBCUTANEOUS | Status: DC
  Administered 2014-06-30 – 2014-07-01 (×5): 2 [IU] via SUBCUTANEOUS

## 2014-06-29 NOTE — Progress Notes (Signed)
[facility] PROGRESS NOTE  Denise Serrano [id number] DOB: 1955/10/28 DOA: 06/28/2014 PCP: Suzanna Obey, MD Interim summary: 59 year old lady with h/o asthma, hypertension, GERD, hypothyroidism, bipolar disorder, OSA on CPAP, stage 3 CKD, DM, hypothyroidism comes in for sob and cough. She was admitted for asthma exacerbation.  Assessment/Plan: Acute respiratory failure from asthma exacerbation.  improving , her wheezing has improved. On tapering steroids, bronchodilators, antibiotics. Blood cultures are pending. Sputum cultures not ordered. HIV antibody is negative. Influenza PCR is negative.    Acute on CKD stage 3.: gentle hydration and repeat renal parameters in am. If renal parameters doesn't improve, please notify Dr Moshe Cipro.    Hypothyroidism:  resume synthroid.    Diabetes Mellitus: CBG (last 3)   Recent Labs  06/29/14 0759 06/29/14 1137 06/29/14 1638  GLUCAP 224* 298* 321*    Increase SSI to resistant scale.    BIpolar Disorder and anxiety: Resume seroquel and wellbutrin   Code Status: full code.  Family Communication: none at bedside Disposition Plan: pending.    Consultants:  none  Procedures: none   Antibiotic:  doxycycline  HPI/Subjective: Breathing better  Objective: Filed Vitals:   06/29/14 1031  BP:   Pulse: 98  Temp: 97.9 F (36.6 C)  Resp: 19    Intake/Output Summary (Last 24 hours) at 06/29/14 1355 Last data filed at 06/29/14 1140  Gross per 24 hour  Intake   2080 ml  Output   2500 ml  Net   -420 ml   Filed Weights   06/28/14 1616 06/28/14 2251  Weight: 94.348 kg (208 lb) 95.1 kg (209 lb 10.5 oz)    Exam:   General:  Alert afbrile comfortable  Cardiovascular: s1s2  Respiratory: air entry fair, scattered wheezing heard  Abdomen: soft NT ND BS+  Musculoskeletal: no pedal edema  Data Reviewed: Basic Metabolic Panel:  Recent Labs Lab 06/28/14 1952  NA 133*  K 4.4  CL 103  CO2 15*  GLUCOSE  62*  BUN 75*  CREATININE 3.85*  CALCIUM 8.5   Liver Function Tests:  Recent Labs Lab 06/28/14 1952  AST 20  ALT 21  ALKPHOS 124*  BILITOT 0.2*  PROT 6.7  ALBUMIN 3.5   No results for input(s): LIPASE, AMYLASE in the last 168 hours. No results for input(s): AMMONIA in the last 168 hours. CBC:  Recent Labs Lab 06/28/14 1952  WBC 9.9  NEUTROABS 6.4  HGB 11.8*  HCT 36.1  MCV 92.3  PLT 200   Cardiac Enzymes: No results for input(s): CKTOTAL, CKMB, CKMBINDEX, TROPONINI in the last 168 hours. BNP (last 3 results) No results for input(s): BNP in the last 8760 hours.  ProBNP (last 3 results) No results for input(s): PROBNP in the last 8760 hours.  CBG:  Recent Labs Lab 06/28/14 1625 06/28/14 2343 06/29/14 0759 06/29/14 1137  GLUCAP 229* 151* 224* 298*    No results found for this or any previous visit (from the past 240 hour(s)).   Studies: Dg Chest 2 View  06/29/2014   CLINICAL DATA:  Cough, shortness of breath and wheezing.  EXAM: CHEST  2 VIEW  COMPARISON:  PA and lateral chest 06/08/2013. Single view of the chest 05/03/2014.  FINDINGS: The lungs are clear. Heart size is normal. There is no pneumothorax or pleural effusion.  IMPRESSION: No acute disease   Electronically Signed   By: Inge Rise M.D.   On: 06/29/2014 08:01   US Renal  06/29/2014   CLINICAL DATA:  Acute kidney  injury.  History diabetes.  EXAM: RENAL/URINARY TRACT ULTRASOUND COMPLETE  COMPARISON:  None.  FINDINGS: Right Kidney:  Length: 9.9 cm. Increased renal cortical echogenicity. No hydronephrosis visualized. 1.7 x 1.2 x 1.5 cm hypoechoic right upper pole renal mass most consistent with a cyst.  Left Kidney:  Length: 9.2 cm. Increased renal cortical echogenicity. No mass or hydronephrosis visualized.  Bladder:  Appears normal for degree of bladder distention.  IMPRESSION: 1. Bilateral echogenic kidneys as can be seen with medical renal disease. 2. No obstructive uropathy.   Electronically Signed    By: Kathreen Devoid   On: 06/29/2014 09:40    Scheduled Meds: . sodium chloride   Intravenous STAT  . aspirin EC  81 mg Oral Daily  . atorvastatin  20 mg Oral QHS  . buPROPion  150 mg Oral q morning - 10a  . dextromethorphan-guaiFENesin  1 tablet Oral BID  . doxycycline  100 mg Oral Q12H  . famotidine  40 mg Oral QHS  . gabapentin  300 mg Oral BID  . heparin  5,000 Units Subcutaneous 3 times per day  . insulin aspart  0-9 Units Subcutaneous TID WC  . ipratropium-albuterol  3 mL Nebulization BID  . levothyroxine  50 mcg Oral QAC breakfast  . montelukast  5 mg Oral Daily  . omega-3 acid ethyl esters  1 g Oral Daily  . [START ON 06/30/2014] predniSONE  60 mg Oral Q breakfast  . QUEtiapine  600 mg Oral QHS   Continuous Infusions:   Principal Problem:   Asthma exacerbation Active Problems:   Type 2 diabetes mellitus with diabetic chronic kidney disease   Diabetic polyneuropathy associated with type 2 diabetes mellitus   Gait difficulty   Bipolar disorder   Essential hypertension   Hypothyroidism   Hyperlipidemia   Dizziness   GERD (gastroesophageal reflux disease)    Time spent: 25 min    [facility] Pager [phone] If 7PM-7AM, please contact night-coverage at [email or web address], password [facility] 06/29/2014, 1:55 PM  LOS: 1 day

## 2014-06-29 NOTE — Evaluation (Addendum)
Occupational Therapy Evaluation Patient Details Name: Denise Serrano MRN: [id number] DOB: 10-24-1955 Today's Date: 06/29/2014    History of Present Illness Cough, shortness of breath and generalized weakness and found to be having an asthma exacerbation.   Clinical Impression   This 59 yo female admitted with above presents to acute OT with at an independent to Mod I level. Her HR per chart at highest today was 57 with me and activity it got up to 124 (will say that perhaps the fact that she talked the whole time she was walking and that she was breathing more heavily at the same time that this may account for her increased HR with minimal activity--walking down the hall). Upon return to her room HR was trending down to 117. No further OT needs identified, we will sign off. Also made PT aware that I did not see any PT needs as well.    Follow Up Recommendations  No OT follow up    Equipment Recommendations  None recommended by OT       Precautions / Restrictions Precautions Precautions: None Restrictions Weight Bearing Restrictions: No      Mobility Bed Mobility               General bed mobility comments: Pt up in recliner upon arrival  Transfers Overall transfer level: Needs assistance Equipment used: None Transfers: Sit to/from Stand Sit to Stand: Independent         General transfer comment: Pt mod I with up flight of steps with step over step and down steps with a step too pattern with use of rail on one side. Ambulated 1/2 of unit and back to room at an independent level.         ADL Overall ADL's : Independent  I did educated pt on purse lipped breathing when she does feel SOB to help her regulate her breathing which should also affect her HR.                                           Vision Additional Comments: No change from baseline          Pertinent Vitals/Pain Pain Assessment: No/denies pain     Hand Dominance Right    Extremity/Trunk Assessment Upper Extremity Assessment Upper Extremity Assessment: Overall WFL for tasks assessed   Lower Extremity Assessment Lower Extremity Assessment: Overall WFL for tasks assessed       Communication Communication Communication: No difficulties   Cognition Arousal/Alertness: Awake/alert Behavior During Therapy: WFL for tasks assessed/performed Overall Cognitive Status: Within Functional Limits for tasks assessed                                Home Living Family/patient expects to be discharged to:: Private residence Living Arrangements: Spouse/significant other Available Help at Discharge: Family;Available 24 hours/day Type of Home: House Home Access: Ramped entrance     Home Layout: Two level Alternate Level Stairs-Number of Steps: 12 Alternate Level Stairs-Rails: Right Bathroom Shower/Tub: Walk-in shower;Curtain Shower/tub characteristics: Curtain       Home Equipment: None          Prior Functioning/Environment Level of Independence: Independent             OT Diagnosis: Generalized weakness         OT Goals(Current goals can  be found in the care plan section) Acute Rehab OT Goals Patient Stated Goal: home soon and use my nebulizer like I am suppose to  OT Frequency:                End of Session Equipment Utilized During Treatment:  (none) Nurse Communication:  (Pt's HR up; however SATs good at 96%)  Activity Tolerance: Patient tolerated treatment well Patient left: in chair;with call bell/phone within reach   Time: [id number] OT Time Calculation (min): 21 min Charges:  OT General Charges $OT Visit: 1 Procedure OT Evaluation $Initial OT Evaluation Tier I: 1 Procedure  Almon Register [id number] 06/29/2014, 2:04 PM

## 2014-06-29 NOTE — Progress Notes (Signed)
Patient refusing bed alarm this evening shift.  Re-educated patient on importance of utilizing bed alarm; patient still refused.  Non-skid socks refused.  Bed in lowest position.  Emphasized use of call bell if needed to use the bathroom; verbalized understanding.  Patient also does not use CPAP at night for sleep apnea.  Per patient request, placed on 2L O2 via nasal cannula.  Will continue to monitor patient.

## 2014-06-29 NOTE — Progress Notes (Signed)
PT Cancellation/Discharge Note  Patient Details Name: Denise Serrano MRN: [id number] DOB: 05/06/1955   Cancelled Treatment:    Reason Eval/Treat Not Completed: PT screened, no needs identified, will sign off Spoke with patient.  Did well with mobility with OT.  Patient reports no PT needs.  PT will sign off.  Despina Pole 06/29/2014, 5:20 PM Carita Pian. Sanjuana Kava, [place] Pager [phone]

## 2014-06-29 NOTE — Progress Notes (Signed)
Inpatient Diabetes Program Recommendations  AACE/ADA: New Consensus Statement on Inpatient Glycemic Control (2013)  Target Ranges:  Prepandial:   less than 140 mg/dL      Peak postprandial:   less than 180 mg/dL (1-2 hours)      Critically ill patients:  140 - 180 mg/dL   Reason for Assessment:  Results for LEORIA, PEGLOW (MRN [id number]) as of 06/29/2014 12:31  Ref. Range 06/28/2014 16:25 06/28/2014 23:43 06/29/2014 07:59 06/29/2014 11:37  Glucose-Capillary Latest Range: 70-99 mg/dL 229 (H) 151 (H) 224 (H) 298 (H)   Diabetes history: Type 2 diabetes Outpatient Diabetes medications: Amaryl 1 mg daily, Januvia 25 mg daily Current orders for Inpatient glycemic control:  Novolog sensitive tid with meals, Prednisone 60 mg daily  May consider adding NPH 10 units q AM while patient is on PO Prednisone.  Thanks, Adah Perl, RN, BC-ADM Inpatient Diabetes Coordinator Pager [phone] (8a-5p)

## 2014-06-30 LAB — GLUCOSE, CAPILLARY
GLUCOSE-CAPILLARY: 191 mg/dL — AB (ref 70–99)
Glucose-Capillary: 118 mg/dL — ABNORMAL HIGH (ref 70–99)
Glucose-Capillary: 165 mg/dL — ABNORMAL HIGH (ref 70–99)
Glucose-Capillary: 273 mg/dL — ABNORMAL HIGH (ref 70–99)

## 2014-06-30 LAB — BASIC METABOLIC PANEL
Anion gap: 12 (ref 5–15)
BUN: 54 mg/dL — ABNORMAL HIGH (ref 6–23)
CALCIUM: 9.2 mg/dL (ref 8.4–10.5)
CHLORIDE: 113 mmol/L — AB (ref 96–112)
CO2: 16 mmol/L — AB (ref 19–32)
Creatinine, Ser: 2.26 mg/dL — ABNORMAL HIGH (ref 0.50–1.10)
GFR calc Af Amer: 26 mL/min — ABNORMAL LOW (ref >90)
GFR calc non Af Amer: 23 mL/min — ABNORMAL LOW (ref >90)
Glucose, Bld: 226 mg/dL — ABNORMAL HIGH (ref 70–99)
POTASSIUM: 4.9 mmol/L (ref 3.5–5.1)
Sodium: 141 mmol/L (ref 135–145)

## 2014-06-30 LAB — CBC
HEMATOCRIT: 35.9 % — AB (ref 36.0–46.0)
Hemoglobin: 11.3 g/dL — ABNORMAL LOW (ref 12.0–15.0)
MCH: 30 pg (ref 26.0–34.0)
MCHC: 31.5 g/dL (ref 30.0–36.0)
MCV: 95.2 fL (ref 78.0–100.0)
Platelets: 238 10*3/uL (ref 150–400)
RBC: 3.77 MIL/uL — ABNORMAL LOW (ref 3.87–5.11)
RDW: 14.9 % (ref 11.5–15.5)
WBC: 11.1 10*3/uL — AB (ref 4.0–10.5)

## 2014-06-30 LAB — SODIUM, URINE, RANDOM: SODIUM UR: 64 mmol/L

## 2014-06-30 LAB — CREATININE, URINE, RANDOM: CREATININE, URINE: 52.77 mg/dL

## 2014-06-30 MED ORDER — SODIUM CHLORIDE 0.9 % IV BOLUS (SEPSIS)
500.0000 mL | Freq: Once | INTRAVENOUS | Status: AC
  Administered 2014-06-30: 500 mL via INTRAVENOUS

## 2014-06-30 MED ORDER — PREDNISONE 20 MG PO TABS
40.0000 mg | ORAL_TABLET | Freq: Every day | ORAL | Status: DC
  Administered 2014-07-01: 40 mg via ORAL
  Filled 2014-06-30 (×2): qty 2

## 2014-06-30 MED ORDER — VANCOMYCIN HCL IN DEXTROSE 1-5 GM/200ML-% IV SOLN
1000.0000 mg | INTRAVENOUS | Status: DC
  Administered 2014-06-30: 1000 mg via INTRAVENOUS
  Filled 2014-06-30: qty 200

## 2014-06-30 NOTE — Progress Notes (Signed)
ANTIBIOTIC CONSULT NOTE - INITIAL  Pharmacy Consult for vancomycin Indication: bacteremia  Allergies  Allergen Reactions  . Amoxicillin Diarrhea    Patient Measurements: Height: 5' (152.4 cm) Weight: 210 lb 6.4 oz (95.437 kg) IBW/kg (Calculated) : 45.5  Vital Signs: Temp: 97.5 F (36.4 C) (04/09 0421) Temp Source: Oral (04/09 0421) BP: 91/44 mmHg (04/09 0617) Pulse Rate: 86 (04/09 0617) Intake/Output from previous day: 04/08 0701 - 04/09 0700 In: 1920 [P.O.:1920] Out: 3450 [Urine:3450] Intake/Output from this shift: Total I/O In: 960 [P.O.:960] Out: 2500 [Urine:2500]  Labs:  Recent Labs  06/28/14 1952  WBC 9.9  HGB 11.8*  PLT 200  CREATININE 3.85*   Estimated Creatinine Clearance: 16.3 mL/min (by C-G formula based on Cr of 3.85).   Microbiology: Recent Results (from the past 720 hour(s))  Culture, blood (routine x 2) Call MD if unable to obtain prior to antibiotics being given     Status: None (Preliminary result)   Collection Time: 06/29/14 12:31 AM  Result Value Ref Range Status   Specimen Description BLOOD RIGHT ARM  Final   Special Requests BOTTLES DRAWN AEROBIC ONLY 5CC  Final   Culture   Final    GRAM POSITIVE COCCI IN CLUSTERS Note: Gram Stain Report Called to,Read Back By and Verified With: BECCA QUI 605AM 06/30/14 [place] Performed at [facility]    Report Status PENDING  Incomplete    Medical History: Past Medical History  Diagnosis Date  . Bipolar 1 disorder, mixed   . Impaired hearing hearing aid- bilateral  . Elevated blood pressure (not hypertension) borderline-  monitored  . Seasonal allergies   . Asthma   . Urgency of urination   . Nocturia   . OSA on CPAP   . Arthritis of knee, left   . Stress incontinence, female   . Cataract immature BILATERAL  . Chronic kidney disease (CKD), stage III (moderate) MONITORED BY DR GOLDSBOROUGH  . Diabetes mellitus without complication     Medications:  Prescriptions prior to  admission  Medication Sig Dispense Refill Last Dose  . ALPRAZolam (XANAX) 0.5 MG tablet Take 0.5 mg by mouth 3 (three) times daily as needed for anxiety (anxiety). For anxiety.   06/27/2014 at Unknown time  . aspirin EC 81 MG tablet Take 81 mg by mouth daily.   06/28/2014 at Unknown time  . atorvastatin (LIPITOR) 20 MG tablet Take 20 mg by mouth at bedtime.    06/27/2014 at Unknown time  . budesonide-formoterol (SYMBICORT) 160-4.5 MCG/ACT inhaler Inhale 2 puffs into the lungs 2 (two) times daily.   06/27/2014 at Unknown time  . buPROPion (WELLBUTRIN SR) 150 MG 12 hr tablet Take 150 mg by mouth every morning.    06/27/2014 at Unknown time  . cyclobenzaprine (FLEXERIL) 10 MG tablet Take 10 mg by mouth 3 (three) times daily as needed for muscle spasms (muscle spams).    06/27/2014 at Unknown time  . FIBER SELECT GUMMIES PO Take 1 Dose by mouth daily.   06/28/2014 at Unknown time  . gabapentin (NEURONTIN) 300 MG capsule Take 300 mg by mouth 2 (two) times daily.    06/28/2014 at Unknown time  . glimepiride (AMARYL) 1 MG tablet Take 1 tablet (1 mg total) by mouth daily with breakfast. 30 tablet 0 06/28/2014 at Unknown time  . levothyroxine (SYNTHROID, LEVOTHROID) 50 MCG tablet Take 50 mcg by mouth daily.   06/28/2014 at Unknown time  . montelukast (SINGULAIR) 5 MG chewable tablet Chew 5 mg by mouth  daily. Take 5mg  per patient   06/28/2014 at Unknown time  . Omega-3 Fatty Acids (FISH OIL) 1200 MG CAPS Take 1 capsule by mouth every evening.   06/27/2014 at Unknown time  . QUEtiapine (SEROQUEL XR) 300 MG 24 hr tablet Take 600 mg by mouth at bedtime.   06/27/2014 at Unknown time  . ranitidine (ZANTAC) 300 MG capsule Take 300 mg by mouth every evening.   06/27/2014 at Unknown time  . sitaGLIPtin (JANUVIA) 25 MG tablet Take 1 tablet (25 mg total) by mouth daily.   06/28/2014 at Unknown time   Scheduled:  . aspirin EC  81 mg Oral Daily  . atorvastatin  20 mg Oral QHS  . buPROPion  150 mg Oral q morning - 10a  .  dextromethorphan-guaiFENesin  1 tablet Oral BID  . doxycycline  100 mg Oral Q12H  . famotidine  40 mg Oral QHS  . gabapentin  300 mg Oral BID  . heparin  5,000 Units Subcutaneous 3 times per day  . insulin aspart  0-20 Units Subcutaneous TID WC  . insulin aspart  0-5 Units Subcutaneous QHS  . insulin aspart  2 Units Subcutaneous TID WC  . ipratropium-albuterol  3 mL Nebulization BID  . levothyroxine  50 mcg Oral QAC breakfast  . montelukast  5 mg Oral Daily  . omega-3 acid ethyl esters  1 g Oral Daily  . predniSONE  60 mg Oral Q breakfast  . QUEtiapine  600 mg Oral QHS  . sodium chloride  500 mL Intravenous Once    Assessment: 59yo female admitted 4/7 for asthma exacerbation, blood cx now resulted w/ GPC in clusters, to begin IV ABX; pt has CKD stage 3 w/ acute prerenal failure.  Goal of Therapy:  Vancomycin trough level 15-20 mcg/ml  Plan:  Will begin vancomycin 1000mg  IV Q48H and monitor CBC, Cx, levels prn; will recheck SCr in am to assess cont'd renal function.  Wynona Neat, PharmD, BCPS  06/30/2014,6:52 AM

## 2014-06-30 NOTE — Progress Notes (Signed)
[facility] PROGRESS NOTE  Denise Serrano [id number] DOB: [birth year] DOA: 06/28/2014 PCP: Suzanna Obey, MD  Brief Summary  59 year old lady with h/o asthma, hypertension, GERD, hypothyroidism, bipolar disorder, OSA on CPAP, stage 3 CKD, DM, hypothyroidism comes in for sob and cough. She was admitted for asthma exacerbation.  Course complicated by positive blood culture.  Started on vancomycin on 4/9.    Assessment/Plan  Acute respiratory failure due to asthma exacerbation -  D/c Solu-Medrol -  Continue duo nebs -  DC doxycycline - Influenza PCR negative but she has cold symptoms so continue droplet precautions for 4 more days  Bacteremia and possible sepsis (hypotension, tachycardia) -  BCx from admission with GPC in clusters in 1 of 2 -  Vancomycin day 1 -  Repeat blood culture -  ECHO if not contaminate  Acute on CKD stage 3 with metabolic acidosis but normal potassium -  Given IV fluids -  Repeat BMP   Hypothyroidism, TSH 0.865 04/2014.  Continue synthroid  Diabetes mellitus type 2, hyperglycemic during day today due to steroids -  Taper steriods quickly -  Add aspart with meals -  Continue high dose SSI   BIpolar Disorder and anxiety, stable, continue seroquel and wellbutrin  Anemia of renal disease -  Repeat hemoglobin  Diet:  Diabetic, healthy heart Access:  PIV IVF:  off  Proph:  heparin  Code Status: full Family Communication: patient alone Disposition Plan: pending results of blood culture   Consultants:  None  Procedures:  CXR  Antibiotics:  Vancomycin 4/9 >>  Doxycycline 4/8 >> 4/9  HPI/Subjective:  Feels well, no SOB, chest pain, nausea, vomiting, diarrhea.  Walking up and down halls off oxygen.  Has some sinus congestion.    Objective: Filed Vitals:   06/30/14 0617 06/30/14 0800 06/30/14 0915 06/30/14 0945  BP: 91/44 106/57  112/59  Pulse: 86 80  88  Temp:  97.4 F (36.3 C)  97.8 F (36.6 C)  TempSrc:  Axillary  Oral   Resp:    17  Height:      Weight:      SpO2:  97% 97% 99%    Intake/Output Summary (Last 24 hours) at 06/30/14 1519 Last data filed at 06/30/14 1316  Gross per 24 hour  Intake 1928.33 ml  Output   3550 ml  Net -1621.67 ml   Filed Weights   06/28/14 1616 06/28/14 2251 06/29/14 2123  Weight: 94.348 kg (208 lb) 95.1 kg (209 lb 10.5 oz) 95.437 kg (210 lb 6.4 oz)    Exam:   General:  Obese F, No acute distress  HEENT:  NCAT, MMM, swollen turbinates  Cardiovascular:  RRR, nl S1, S2 no mrg, 2+ pulses, warm extremities  Respiratory:  Diminished bilateral BS without wheezes, rales, or rhonchi, no increased WOB  Abdomen:   NABS, soft, NT/ND  MSK:   Normal tone and bulk, no LEE  Neuro:  Grossly intact  Data Reviewed: Basic Metabolic Panel:  Recent Labs Lab 06/28/14 1952  NA 133*  K 4.4  CL 103  CO2 15*  GLUCOSE 62*  BUN 75*  CREATININE 3.85*  CALCIUM 8.5   Liver Function Tests:  Recent Labs Lab 06/28/14 1952  AST 20  ALT 21  ALKPHOS 124*  BILITOT 0.2*  PROT 6.7  ALBUMIN 3.5   No results for input(s): LIPASE, AMYLASE in the last 168 hours. No results for input(s): AMMONIA in the last 168 hours. CBC:  Recent Labs Lab 06/28/14 1952  WBC 9.9  NEUTROABS 6.4  HGB 11.8*  HCT 36.1  MCV 92.3  PLT 200   Cardiac Enzymes: No results for input(s): CKTOTAL, CKMB, CKMBINDEX, TROPONINI in the last 168 hours. BNP (last 3 results) No results for input(s): BNP in the last 8760 hours.  ProBNP (last 3 results) No results for input(s): PROBNP in the last 8760 hours.  CBG:  Recent Labs Lab 06/29/14 1137 06/29/14 1638 06/29/14 2126 06/30/14 0746 06/30/14 1129  GLUCAP 298* 321* 186* 118* 165*    Recent Results (from the past 240 hour(s))  Culture, blood (routine x 2) Call MD if unable to obtain prior to antibiotics being given     Status: None (Preliminary result)   Collection Time: 06/29/14 12:27 AM  Result Value Ref Range Status   Specimen  Description BLOOD LEFT ARM  Final   Special Requests BOTTLES DRAWN AEROBIC AND ANAEROBIC 5CC EACH  Final   Culture   Final           BLOOD CULTURE RECEIVED NO GROWTH TO DATE CULTURE WILL BE HELD FOR 5 DAYS BEFORE ISSUING A FINAL NEGATIVE REPORT Performed at [facility]    Report Status PENDING  Incomplete  Culture, blood (routine x 2) Call MD if unable to obtain prior to antibiotics being given     Status: None (Preliminary result)   Collection Time: 06/29/14 12:31 AM  Result Value Ref Range Status   Specimen Description BLOOD RIGHT ARM  Final   Special Requests BOTTLES DRAWN AEROBIC ONLY 5CC  Final   Culture   Final    GRAM POSITIVE COCCI IN CLUSTERS Note: Gram Stain Report Called to,Read Back By and Verified With: BECCA QUI 605AM 06/30/14 [place] Performed at [facility]    Report Status PENDING  Incomplete     Studies: Dg Chest 2 View  06/29/2014   CLINICAL DATA:  Cough, shortness of breath and wheezing.  EXAM: CHEST  2 VIEW  COMPARISON:  PA and lateral chest 06/08/2013. Single view of the chest 05/03/2014.  FINDINGS: The lungs are clear. Heart size is normal. There is no pneumothorax or pleural effusion.  IMPRESSION: No acute disease   Electronically Signed   By: Inge Rise M.D.   On: 06/29/2014 08:01   US Renal  06/29/2014   CLINICAL DATA:  Acute kidney injury.  History diabetes.  EXAM: RENAL/URINARY TRACT ULTRASOUND COMPLETE  COMPARISON:  None.  FINDINGS: Right Kidney:  Length: 9.9 cm. Increased renal cortical echogenicity. No hydronephrosis visualized. 1.7 x 1.2 x 1.5 cm hypoechoic right upper pole renal mass most consistent with a cyst.  Left Kidney:  Length: 9.2 cm. Increased renal cortical echogenicity. No mass or hydronephrosis visualized.  Bladder:  Appears normal for degree of bladder distention.  IMPRESSION: 1. Bilateral echogenic kidneys as can be seen with medical renal disease. 2. No obstructive uropathy.   Electronically Signed   By: Kathreen Devoid    On: 06/29/2014 09:40    Scheduled Meds: . aspirin EC  81 mg Oral Daily  . atorvastatin  20 mg Oral QHS  . buPROPion  150 mg Oral q morning - 10a  . dextromethorphan-guaiFENesin  1 tablet Oral BID  . doxycycline  100 mg Oral Q12H  . famotidine  40 mg Oral QHS  . gabapentin  300 mg Oral BID  . heparin  5,000 Units Subcutaneous 3 times per day  . insulin aspart  0-20 Units Subcutaneous TID WC  . insulin aspart  0-5 Units Subcutaneous  QHS  . insulin aspart  2 Units Subcutaneous TID WC  . ipratropium-albuterol  3 mL Nebulization BID  . levothyroxine  50 mcg Oral QAC breakfast  . montelukast  5 mg Oral Daily  . omega-3 acid ethyl esters  1 g Oral Daily  . predniSONE  60 mg Oral Q breakfast  . QUEtiapine  600 mg Oral QHS  . vancomycin  1,000 mg Intravenous Q48H   Continuous Infusions:   Principal Problem:   Asthma exacerbation Active Problems:   Type 2 diabetes mellitus with diabetic chronic kidney disease   Diabetic polyneuropathy associated with type 2 diabetes mellitus   Gait difficulty   Bipolar disorder   Essential hypertension   Hypothyroidism   Hyperlipidemia   Dizziness   GERD (gastroesophageal reflux disease)    Time spent: 30 min    Lakechia Nay, [facility] Pager [phone]. If 7PM-7AM, please contact night-coverage at [email or web address], password [facility] 06/30/2014, 3:19 PM  LOS: 2 days

## 2014-06-30 NOTE — Progress Notes (Signed)
Additional 500 cc bolus of NS ordered.  Will administer now.

## 2014-06-30 NOTE — Progress Notes (Signed)
BP 85/39.  Patient resting peacefully; asymptomatic. Notified on-call Lynch.  500 cc bolus of NS ordered.  Infusing now.  Will recheck blood pressure after infusion complete.  Will continue to monitor.

## 2014-06-30 NOTE — Progress Notes (Signed)
Blood cultures positive for gram positive cocci in clusters.  Notified on-call Lynch.  Will continue to monitor.

## 2014-07-01 DIAGNOSIS — E872 Acidosis, unspecified: Secondary | ICD-10-CM

## 2014-07-01 DIAGNOSIS — N189 Chronic kidney disease, unspecified: Secondary | ICD-10-CM

## 2014-07-01 DIAGNOSIS — D631 Anemia in chronic kidney disease: Secondary | ICD-10-CM

## 2014-07-01 LAB — RESPIRATORY VIRUS PANEL
Adenovirus: NEGATIVE
Influenza A: NEGATIVE
Influenza B: NEGATIVE
Metapneumovirus: NEGATIVE
Parainfluenza 1: NEGATIVE
Parainfluenza 2: NEGATIVE
Parainfluenza 3: NEGATIVE
RHINOVIRUS: NEGATIVE
Respiratory Syncytial Virus A: NEGATIVE
Respiratory Syncytial Virus B: NEGATIVE

## 2014-07-01 LAB — CBC
HEMATOCRIT: 31.9 % — AB (ref 36.0–46.0)
Hemoglobin: 10 g/dL — ABNORMAL LOW (ref 12.0–15.0)
MCH: 29.7 pg (ref 26.0–34.0)
MCHC: 31.3 g/dL (ref 30.0–36.0)
MCV: 94.7 fL (ref 78.0–100.0)
Platelets: 225 10*3/uL (ref 150–400)
RBC: 3.37 MIL/uL — ABNORMAL LOW (ref 3.87–5.11)
RDW: 14.8 % (ref 11.5–15.5)
WBC: 11.2 10*3/uL — AB (ref 4.0–10.5)

## 2014-07-01 LAB — CULTURE, BLOOD (ROUTINE X 2)

## 2014-07-01 LAB — GLUCOSE, CAPILLARY
GLUCOSE-CAPILLARY: 121 mg/dL — AB (ref 70–99)
Glucose-Capillary: 163 mg/dL — ABNORMAL HIGH (ref 70–99)

## 2014-07-01 LAB — BASIC METABOLIC PANEL
Anion gap: 9 (ref 5–15)
BUN: 48 mg/dL — ABNORMAL HIGH (ref 6–23)
CALCIUM: 9 mg/dL (ref 8.4–10.5)
CO2: 16 mmol/L — AB (ref 19–32)
Chloride: 117 mmol/L — ABNORMAL HIGH (ref 96–112)
Creatinine, Ser: 1.91 mg/dL — ABNORMAL HIGH (ref 0.50–1.10)
GFR calc non Af Amer: 28 mL/min — ABNORMAL LOW (ref >90)
GFR, EST AFRICAN AMERICAN: 32 mL/min — AB (ref >90)
Glucose, Bld: 141 mg/dL — ABNORMAL HIGH (ref 70–99)
Potassium: 4.6 mmol/L (ref 3.5–5.1)
SODIUM: 142 mmol/L (ref 135–145)

## 2014-07-01 MED ORDER — SODIUM BICARBONATE 650 MG PO TABS: 1300.0000 mg | ORAL_TABLET | Freq: Two times a day (BID) | ORAL | Status: DC

## 2014-07-01 MED ORDER — ALBUTEROL SULFATE (2.5 MG/3ML) 0.083% IN NEBU: 2.5000 mg | INHALATION_SOLUTION | RESPIRATORY_TRACT | Status: DC | PRN

## 2014-07-01 MED ORDER — DOXYCYCLINE HYCLATE 100 MG PO TABS: 100.0000 mg | ORAL_TABLET | Freq: Two times a day (BID) | ORAL | Status: DC

## 2014-07-01 MED ORDER — SODIUM BICARBONATE 650 MG PO TABS
1300.0000 mg | ORAL_TABLET | Freq: Two times a day (BID) | ORAL | Status: DC
  Administered 2014-07-01: 1300 mg via ORAL
  Filled 2014-07-01 (×2): qty 2

## 2014-07-01 MED ORDER — PREDNISONE 20 MG PO TABS: 40.0000 mg | ORAL_TABLET | Freq: Every day | ORAL | Status: DC

## 2014-07-01 NOTE — Discharge Summary (Addendum)
Physician Discharge Summary  Denise Serrano [id number] DOB: 12-May-1955 DOA: 06/28/2014  PCP: Suzanna Obey, MD  Admit date: 06/28/2014 Discharge date: 07/01/2014  Recommendations for Outpatient Follow-up:   1.  PCP to please repeat BMP in 1 week by PCP to check creatinine and CO2 and CBC to f/u anemia and leukocytosis 2.  Started sodium bicarbonate 1200mg  BID  3.  Gave Daphanie Oquendo course of doxycycline and prednisone for asthma exacerbation.  Patient stated she already had albuterol MDI and nebulizer at home.  Continued symbicort 4.  F/u with nephrology within 2-3 weeks for ongoing monitoring of CKD 5.  PCP to please f/u final results of blood cultures  Discharge Diagnoses:  Principal Problem:   Asthma exacerbation Active Problems:   Type 2 diabetes mellitus with diabetic chronic kidney disease   Diabetic polyneuropathy associated with type 2 diabetes mellitus   Gait difficulty   AKI (acute kidney injury)   Bipolar disorder   Essential hypertension   Hypothyroidism   Hyperlipidemia   Dizziness   GERD (gastroesophageal reflux disease)   Metabolic acidosis   Anemia of renal disease   Discharge Condition: stable, improved  Diet recommendation: Diabetic, heart healthy  Wt Readings from Last 3 Encounters:  06/30/14 96.662 kg (213 lb 1.6 oz)  05/23/14 95.255 kg (210 lb)  05/05/14 99.1 kg (218 lb 7.6 oz)    History of present illness:  59 year old lady with h/o asthma, hypertension, GERD, hypothyroidism, bipolar disorder, OSA on CPAP, stage 3 CKD, DM, hypothyroidism comes in for sob and cough. She was admitted for asthma exacerbation. Course complicated by positive blood culture. Started on vancomycin on 4/9. Blood culture grew coag-negative staph and was likely contaminant.  She had some acute kidney injury which improved with IV fluids. She continued to have a metabolic acidosis and was started on sodium bicarbonate. She will need a repeat BMP done in approximately 1 week by her  primary care doctor. Please follow-up on CO2 and adjust the dose of her sodium bicarbonate if less than 20. She should follow-up with her nephrologist within 2 weeks of discharge.  Hospital Course:   Acute respiratory failure due to asthma exacerbation -  Marrissa Dai course of prednisone and doxycycline prescribed - Continue albuterol when necessary, Symbicort -  Influenza and respiratory viral panel negative  Positive blood culture with coag negative staph. - BCx from admission with coag negative staph in 1 of 2 cultures -  She had been given empiric Vancomycin while awaiting speciation, but this was discontinued after cultures were reported - f/u blood culture NGTD  Acute on CKD stage 3 with metabolic acidosis but normal potassium, creatinine trended down from 3.85 to 1.9 with IVF  - started sodium bicarb 1300mg  BID  Hypothyroidism, TSH 0.865 04/2014. Continue synthroid  Diabetes mellitus type 2, hyperglycemic during day today due to steroids - given SSI with aspart but remained somewhat hyperglycemic -  Tapering steroids quickly as outpatient -  Continue home medications at discharge  BIpolar Disorder and anxiety, stable, continue seroquel and wellbutrin  Leukocytosis likely due to steriods -  Repeat CBC in 1-2 weeks  Anemia of renal disease, hemoglobin trended down slightly without evidence of bleeding, likely related to AKI.   -  Repeat CBC in 1-2 weeks to ensure hemoglobin stable.    Consultants:  None  Procedures:  CXR  Antibiotics:  Vancomycin 4/9 >>  Doxycycline 4/8 >> 4/9  Discharge Exam: Filed Vitals:   07/01/14 0940  BP: 153/71  Pulse: 87  Temp:  97.8 F (36.6 C)  Resp: 18   Filed Vitals:   06/30/14 2105 07/01/14 0405 07/01/14 0853 07/01/14 0940  BP: 123/86 104/50  153/71  Pulse: 100 80  87  Temp: 98.6 F (37 C) 97.9 F (36.6 C)  97.8 F (36.6 C)  TempSrc: Axillary Axillary  Axillary  Resp: 17 18  18   Height:      Weight: 96.662 kg (213 lb  1.6 oz)     SpO2: 99% 98% 95% 100%     General: Obese F, No acute distress, walking around room without respiratory distress  HEENT: NCAT, MMM, swollen turbinates  Cardiovascular: RRR, nl S1, S2 no mrg, 2+ pulses, warm extremities  Respiratory: Diminished bilateral BS without wheezes, rales, or rhonchi, no increased WOB  MSK: Normal tone and bulk, no LEE  Neuro: Grossly intact  Discharge Instructions      Discharge Instructions    Call MD for:  difficulty breathing, headache or visual disturbances    Complete by:  As directed      Call MD for:  extreme fatigue    Complete by:  As directed      Call MD for:  hives    Complete by:  As directed      Call MD for:  persistant dizziness or light-headedness    Complete by:  As directed      Call MD for:  persistant nausea and vomiting    Complete by:  As directed      Call MD for:  severe uncontrolled pain    Complete by:  As directed      Call MD for:  temperature >100.4    Complete by:  As directed      Diet - low sodium heart healthy    Complete by:  As directed      Diet Carb Modified    Complete by:  As directed      Discharge instructions    Complete by:  As directed   You were hospitalized with difficulty breathing and have been treated for asthma attack.  Please take prednisone for two more days, next dose tomorrow, and doxycycline for three more days, next dose tomorrow.  Please continue to use your symbicort and use your nebulizer as needed for wheezing or shortness of breath.  For your kidneys, please avoid ibuprofen, naprosyn, and all other over the counter pain medications except for acetaminophen.  Please stay hydrated and start taking sodium bicarbonate 1300mg  twice daily until you follow up with your primary care doctor in about 1 week to have your labs repeated.  Please schedule an appointment with your nephrologist for about 2-3 weeks from now or sooner if you notice swelling of your ankles.     Increase  activity slowly    Complete by:  As directed             Medication List    TAKE these medications        albuterol (2.5 MG/3ML) 0.083% nebulizer solution  Commonly known as:  PROVENTIL  Take 3 mLs (2.5 mg total) by nebulization every 4 (four) hours as needed for wheezing or shortness of breath.     ALPRAZolam 0.5 MG tablet  Commonly known as:  XANAX  Take 0.5 mg by mouth 3 (three) times daily as needed for anxiety (anxiety). For anxiety.     aspirin EC 81 MG tablet  Take 81 mg by mouth daily.     atorvastatin 20 MG tablet  Commonly known as:  LIPITOR  Take 20 mg by mouth at bedtime.     budesonide-formoterol 160-4.5 MCG/ACT inhaler  Commonly known as:  SYMBICORT  Inhale 2 puffs into the lungs 2 (two) times daily.     buPROPion 150 MG 12 hr tablet  Commonly known as:  WELLBUTRIN SR  Take 150 mg by mouth every morning.     doxycycline 100 MG tablet  Commonly known as:  VIBRA-TABS  Take 1 tablet (100 mg total) by mouth every 12 (twelve) hours.  Start taking on:  07/02/2014     FIBER SELECT GUMMIES PO  Take 1 Dose by mouth daily.     Fish Oil 1200 MG Caps  Take 1 capsule by mouth every evening.     FLEXERIL 10 MG tablet  Generic drug:  cyclobenzaprine  Take 10 mg by mouth 3 (three) times daily as needed for muscle spasms (muscle spams).     glimepiride 1 MG tablet  Commonly known as:  AMARYL  Take 1 tablet (1 mg total) by mouth daily with breakfast.     levothyroxine 50 MCG tablet  Commonly known as:  SYNTHROID, LEVOTHROID  Take 50 mcg by mouth daily.     montelukast 5 MG chewable tablet  Commonly known as:  SINGULAIR  Chew 5 mg by mouth daily. Take 5mg  per patient     NEURONTIN 300 MG capsule  Generic drug:  gabapentin  Take 300 mg by mouth 2 (two) times daily.     predniSONE 20 MG tablet  Commonly known as:  DELTASONE  Take 2 tablets (40 mg total) by mouth daily with breakfast.     QUEtiapine 300 MG 24 hr tablet  Commonly known as:  SEROQUEL XR   Take 600 mg by mouth at bedtime.     ranitidine 300 MG capsule  Commonly known as:  ZANTAC  Take 300 mg by mouth every evening.     sitaGLIPtin 25 MG tablet  Commonly known as:  JANUVIA  Take 1 tablet (25 mg total) by mouth daily.     sodium bicarbonate 650 MG tablet  Take 2 tablets (1,300 mg total) by mouth 2 (two) times daily.       Follow-up Information    Follow up with Suzanna Obey, MD. Schedule an appointment as soon as possible for a visit in 1 week.   Specialty:  Family Medicine   Contact information:   [place] [address] [phone]       Follow up with Ulla Potash., MD. Schedule an appointment as soon as possible for a visit in 2 weeks.   Specialty:  Nephrology   Contact information:   [place] [address] [phone]        The results of significant diagnostics from this hospitalization (including imaging, microbiology, ancillary and laboratory) are listed below for reference.    Significant Diagnostic Studies: Dg Chest 2 View  06/29/2014   CLINICAL DATA:  Cough, shortness of breath and wheezing.  EXAM: CHEST  2 VIEW  COMPARISON:  PA and lateral chest 06/08/2013. Single view of the chest 05/03/2014.  FINDINGS: The lungs are clear. Heart size is normal. There is no pneumothorax or pleural effusion.  IMPRESSION: No acute disease   Electronically Signed   By: Inge Rise M.D.   On: 06/29/2014 08:01   US Renal  06/29/2014   CLINICAL DATA:  Acute kidney injury.  History diabetes.  EXAM: RENAL/URINARY TRACT ULTRASOUND COMPLETE  COMPARISON:  None.  FINDINGS: Right Kidney:  Length: 9.9 cm. Increased renal cortical echogenicity. No hydronephrosis visualized. 1.7 x 1.2 x 1.5 cm hypoechoic right upper pole renal mass most consistent with a cyst.  Left Kidney:  Length: 9.2 cm. Increased renal cortical echogenicity. No mass or hydronephrosis visualized.  Bladder:  Appears normal for degree of bladder distention.  IMPRESSION: 1.  Bilateral echogenic kidneys as can be seen with medical renal disease. 2. No obstructive uropathy.   Electronically Signed   By: Kathreen Devoid   On: 06/29/2014 09:40    Microbiology: Recent Results (from the past 240 hour(s))  Respiratory virus panel     Status: None   Collection Time: 06/29/14 12:23 AM  Result Value Ref Range Status   Respiratory Syncytial Virus A Negative Negative Final   Respiratory Syncytial Virus B Negative Negative Final   Influenza A Negative Negative Final   Influenza B Negative Negative Final   Parainfluenza 1 Negative Negative Final   Parainfluenza 2 Negative Negative Final   Parainfluenza 3 Negative Negative Final   Metapneumovirus Negative Negative Final   Rhinovirus Negative Negative Final   Adenovirus Negative Negative Final    Comment: (NOTE) Performed At: [facility] [place], [place] [id number] Lindon Romp MD [id number]   Culture, blood (routine x 2) Call MD if unable to obtain prior to antibiotics being given     Status: None (Preliminary result)   Collection Time: 06/29/14 12:27 AM  Result Value Ref Range Status   Specimen Description BLOOD LEFT ARM  Final   Special Requests BOTTLES DRAWN AEROBIC AND ANAEROBIC 5CC EACH  Final   Culture   Final           BLOOD CULTURE RECEIVED NO GROWTH TO DATE CULTURE WILL BE HELD FOR 5 DAYS BEFORE ISSUING A FINAL NEGATIVE REPORT Performed at [facility]    Report Status PENDING  Incomplete  Culture, blood (routine x 2) Call MD if unable to obtain prior to antibiotics being given     Status: None   Collection Time: 06/29/14 12:31 AM  Result Value Ref Range Status   Specimen Description BLOOD RIGHT ARM  Final   Special Requests BOTTLES DRAWN AEROBIC ONLY 5CC  Final   Culture   Final    STAPHYLOCOCCUS SPECIES (COAGULASE NEGATIVE) Note: THE SIGNIFICANCE OF ISOLATING THIS ORGANISM FROM A SINGLE SET OF BLOOD CULTURES WHEN MULTIPLE SETS ARE DRAWN IS UNCERTAIN. PLEASE NOTIFY  THE MICROBIOLOGY DEPARTMENT WITHIN ONE WEEK IF SPECIATION AND SENSITIVITIES ARE REQUIRED. Note: Gram Stain Report Called to,Read Back By and Verified With: BECCA QUI 605AM 06/30/14 [place] Performed at [facility]    Report Status 07/01/2014 FINAL  Final     Labs: Basic Metabolic Panel:  Recent Labs Lab 06/28/14 1952 06/30/14 1730 07/01/14 0648  NA 133* 141 142  K 4.4 4.9 4.6  CL 103 113* 117*  CO2 15* 16* 16*  GLUCOSE 62* 226* 141*  BUN 75* 54* 48*  CREATININE 3.85* 2.26* 1.91*  CALCIUM 8.5 9.2 9.0   Liver Function Tests:  Recent Labs Lab 06/28/14 1952  AST 20  ALT 21  ALKPHOS 124*  BILITOT 0.2*  PROT 6.7  ALBUMIN 3.5   No results for input(s): LIPASE, AMYLASE in the last 168 hours. No results for input(s): AMMONIA in the last 168 hours. CBC:  Recent Labs Lab 06/28/14 1952 06/30/14 1730 07/01/14 0648  WBC 9.9 11.1* 11.2*  NEUTROABS 6.4  --   --  HGB 11.8* 11.3* 10.0*  HCT 36.1 35.9* 31.9*  MCV 92.3 95.2 94.7  PLT 200 238 225   Cardiac Enzymes: No results for input(s): CKTOTAL, CKMB, CKMBINDEX, TROPONINI in the last 168 hours. BNP: BNP (last 3 results) No results for input(s): BNP in the last 8760 hours.  ProBNP (last 3 results) No results for input(s): PROBNP in the last 8760 hours.  CBG:  Recent Labs Lab 06/30/14 1129 06/30/14 1626 06/30/14 2103 07/01/14 0740 07/01/14 1154  GLUCAP 165* 273* 191* 163* 121*    Time coordinating discharge: 45 minutes  Signed:  Elspeth Blucher  [facility] 07/01/2014, 2:14 PM

## 2014-07-01 NOTE — Progress Notes (Signed)
Utilization review completed.  

## 2014-07-02 LAB — LEGIONELLA ANTIGEN, URINE

## 2014-07-05 LAB — CULTURE, BLOOD (ROUTINE X 2): CULTURE: NO GROWTH

## 2014-07-07 LAB — CULTURE, BLOOD (SINGLE): Culture: NO GROWTH

## 2014-11-23 IMAGING — RF DG MYELOGRAPHY LUMBAR INJ LUMBOSACRAL
14 of 18 series · 14 of 18 positions shown · non-contrast
Comparison: None

CLINICAL DATA: Low back pain
TECHNIQUE: Contiguous axial images were obtained through the Lumbar spine after
the intrathecal infusion of infusion. Coronal and sagittal
reconstructions were obtained of the axial image sets.

[Series 1: (hospital) · 1 of 1 slices shown]
[im 1/1]
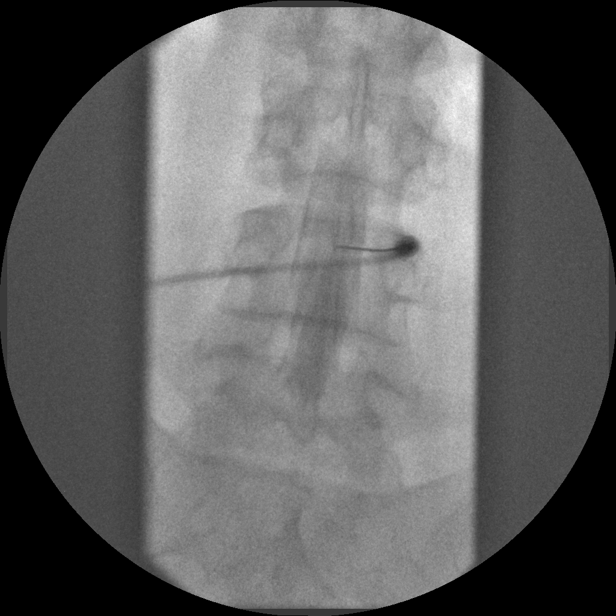

[Series 3: myelogram  white · 1 of 1 slices shown (1 of 11)]
[im 1/1]
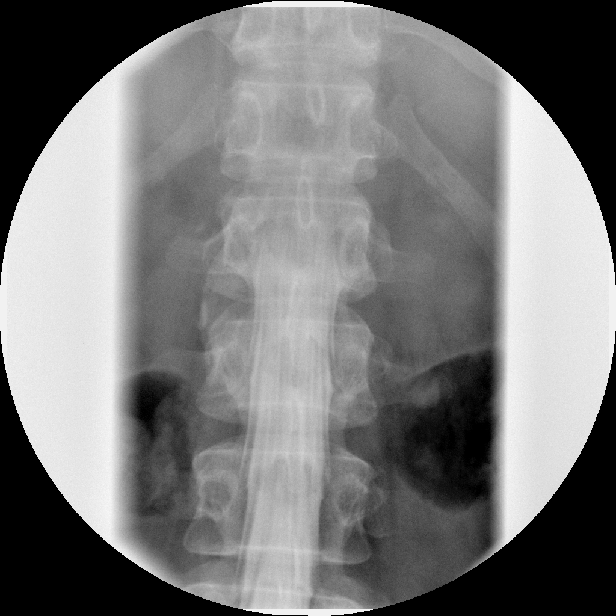

[Series 5: myelogram  white · 1 of 1 slices shown (2 of 11)]
[im 1/1]
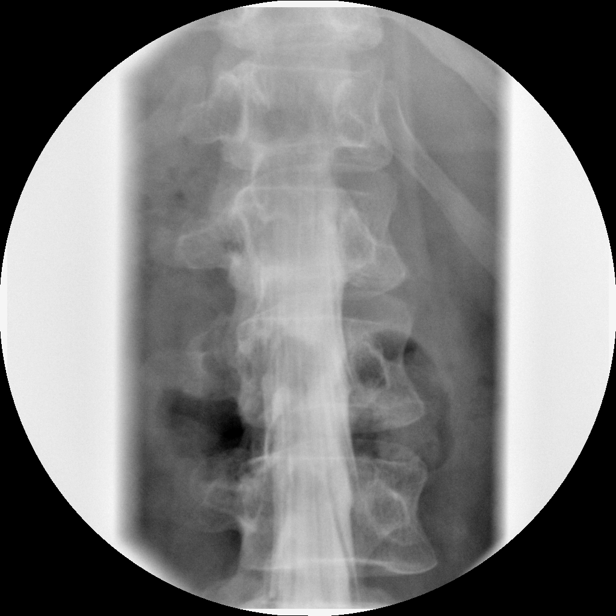

[Series 6: myelogram  white · 1 of 1 slices shown (3 of 11)]
[im 1/1]
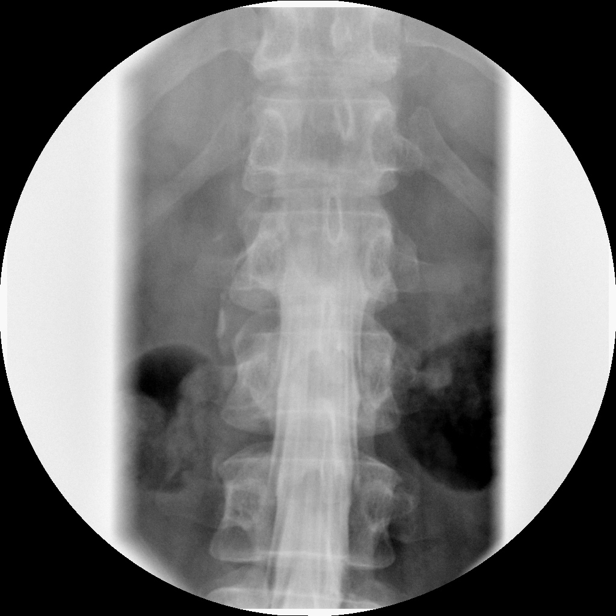

[Series 7: myelogram  white · 1 of 1 slices shown (4 of 11)]
[im 1/1]
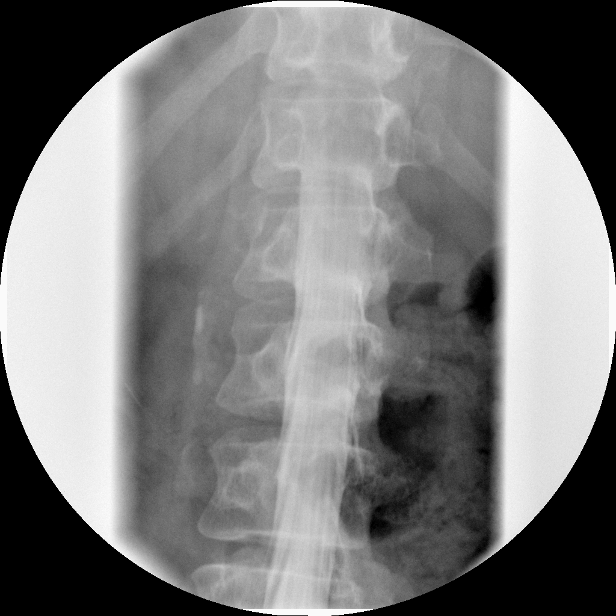

[Series 9: myelogram  white · 1 of 1 slices shown (5 of 11)]
[im 1/1]
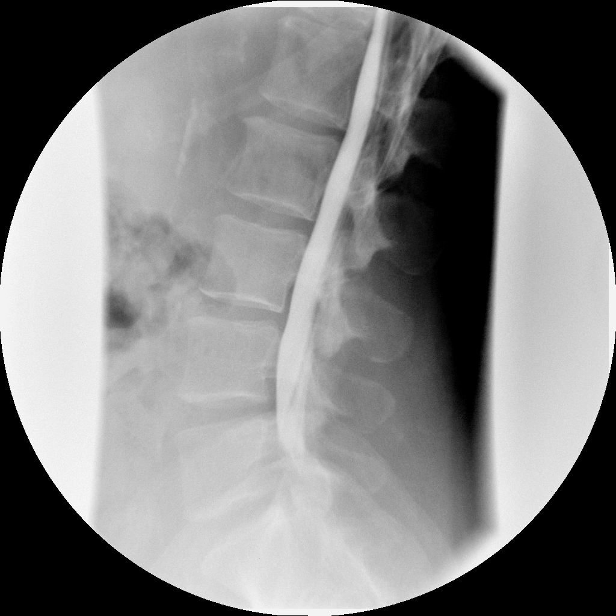

[Series 10: myelogram  white · 1 of 1 slices shown (6 of 11)]
[im 1/1]
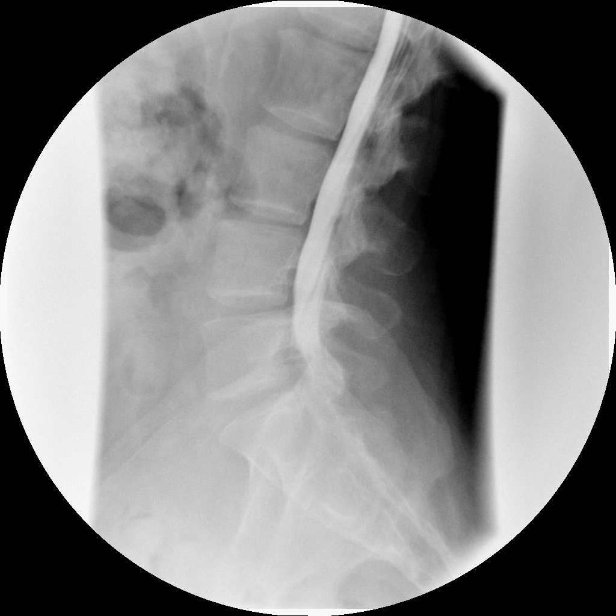

[Series 11: myelogram  white · 1 of 1 slices shown (7 of 11)]
[im 1/1]
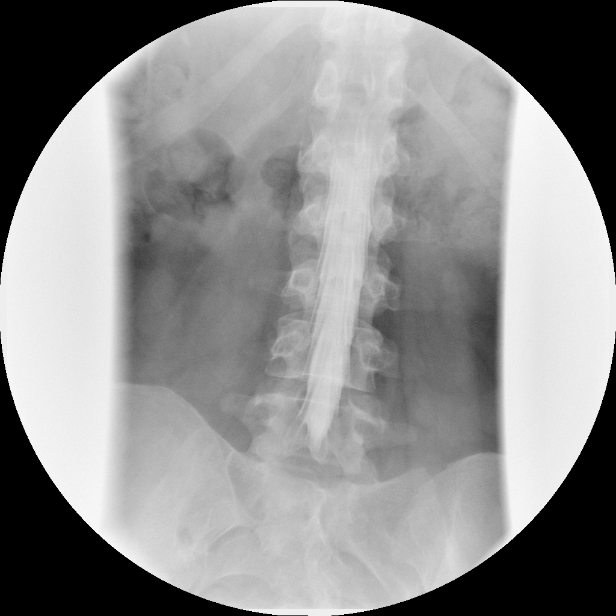

[Series 12: myelogram  white · 1 of 1 slices shown (8 of 11)]
[im 1/1]
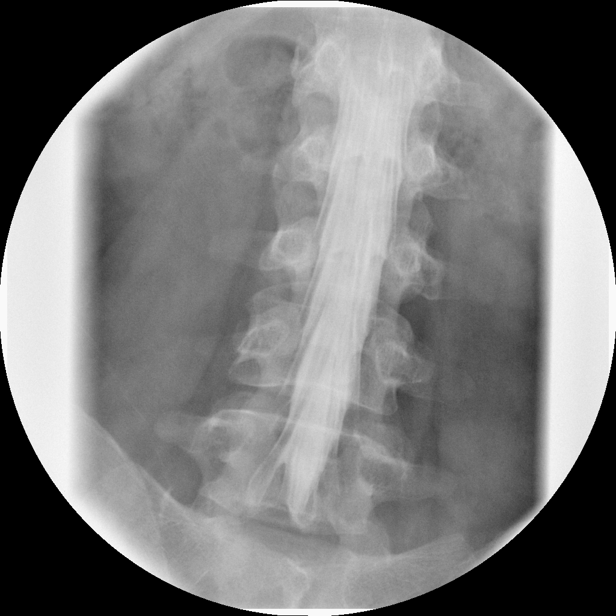

[Series 14: myelogram  white · 1 of 1 slices shown (9 of 11)]
[im 1/1]
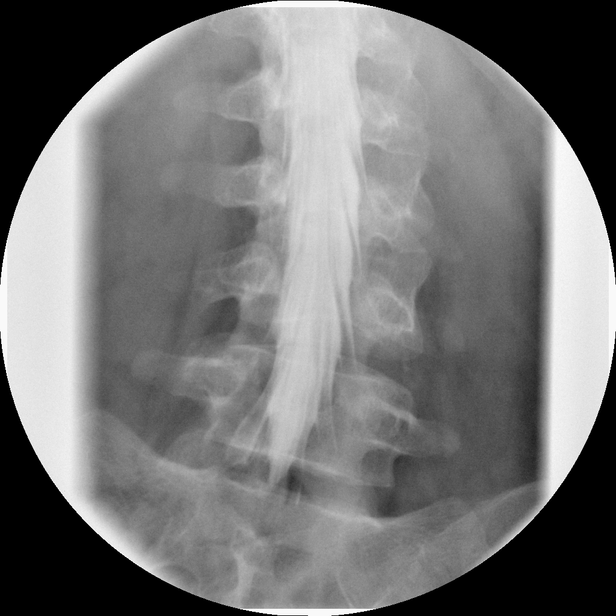

[Series 15: myelogram  white · 1 of 1 slices shown (10 of 11)]
[im 1/1]
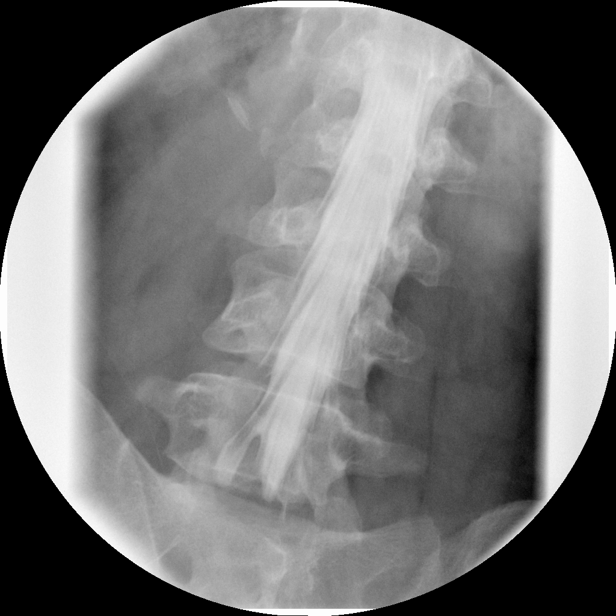

[Series 16: myelogram  white · 1 of 1 slices shown (11 of 11)]
[im 1/1]
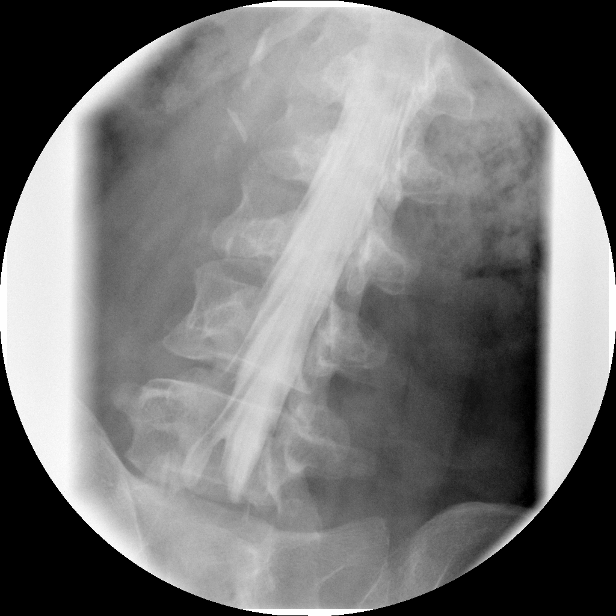

[Series 1002: view not recorded · 0.20mm/px · 1 of 1 slices shown (1 of 2)]
[im 1/1]
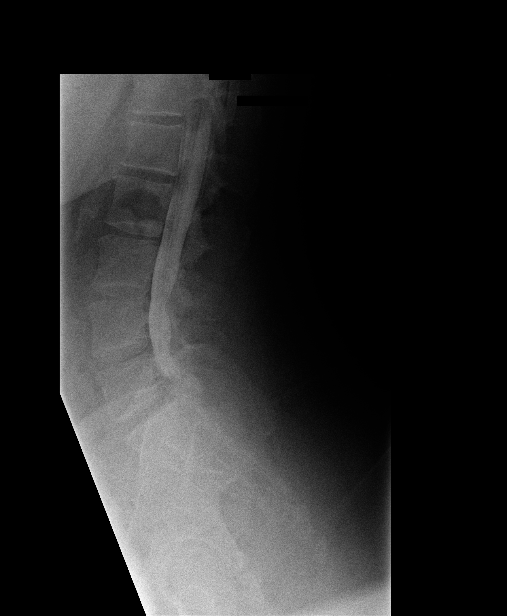

[Series 1003: view not recorded · 0.20mm/px · 1 of 1 slices shown (2 of 2)]
[im 1/1]
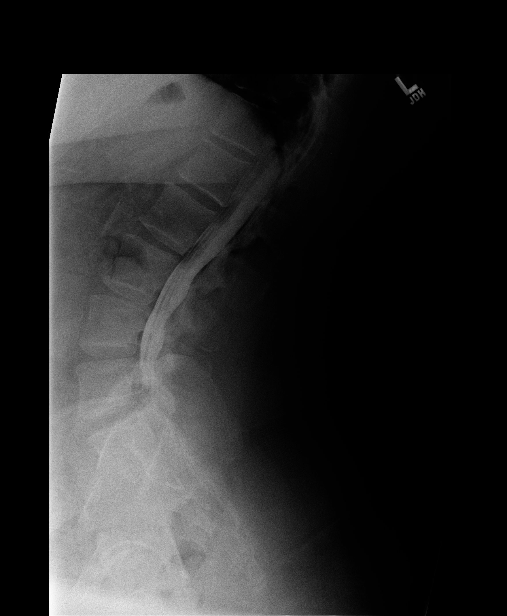

[14 of 18 positions shown; findings below may reference images not displayed]

EXAM:
LUMBAR MYELOGRAM

FLUOROSCOPY TIME:  59 seconds.

PROCEDURE:
After thorough discussion of risks and benefits of the procedure
including bleeding, infection, injury to nerves, blood vessels,
adjacent structures as well as headache and CSF leak, written and
oral informed consent was obtained. Consent was obtained by Dr. Oda
Mongo. Time out form was completed.

Patient was positioned prone on the fluoroscopy table. Local
anesthesia was provided with 1% lidocaine without epinephrine after
prepped and draped in the usual sterile fashion. Puncture was
performed at L3-4 using a 3 1/2 inch 22-gauge spinal needle via a
right paramedian approach. Using a single pass through the dura, the
needle was placed within the thecal sac, with return of clear CSF.
15 mL of Tmnipaque-YXI was injected into the thecal sac, with normal
opacification of the nerve roots and cauda equina consistent with
free flow within the subarachnoid space.

I personally performed the lumbar puncture and administered the
intrathecal contrast. I also personally supervised acquisition of
the myelogram images.
FINDINGS: LUMBAR MYELOGRAM FINDINGS:

There is anatomic alignment without lumbar compression deformity.
There is tapering of the thecal sac at the L5-S1 level. The right S1
nerve root sleeve is asymmetrically effaced compared to that of the
left. The examination is otherwise normal-appearing.
IMPRESSION: LUMBAR MYELOGRAM IMPRESSION:

The thecal sac tapers at L5-S1 and the right S1 nerve root sleeve is
effaced. CT myelogram is to follow.

## 2015-05-15 ENCOUNTER — Other Ambulatory Visit: Payer: Self-pay | Admitting: Family Medicine

## 2015-05-15 DIAGNOSIS — E2839 Other primary ovarian failure: Secondary | ICD-10-CM

## 2015-05-28 IMAGING — CR DG CHEST 2V
2 series · 2 of 2 positions shown · non-contrast
Comparison: PA and lateral chest 06/08/2013. Single view of the
chest 05/03/2014.

CLINICAL DATA: Cough, shortness of breath and wheezing.

EXAM:
CHEST  2 VIEW

[chest pa]
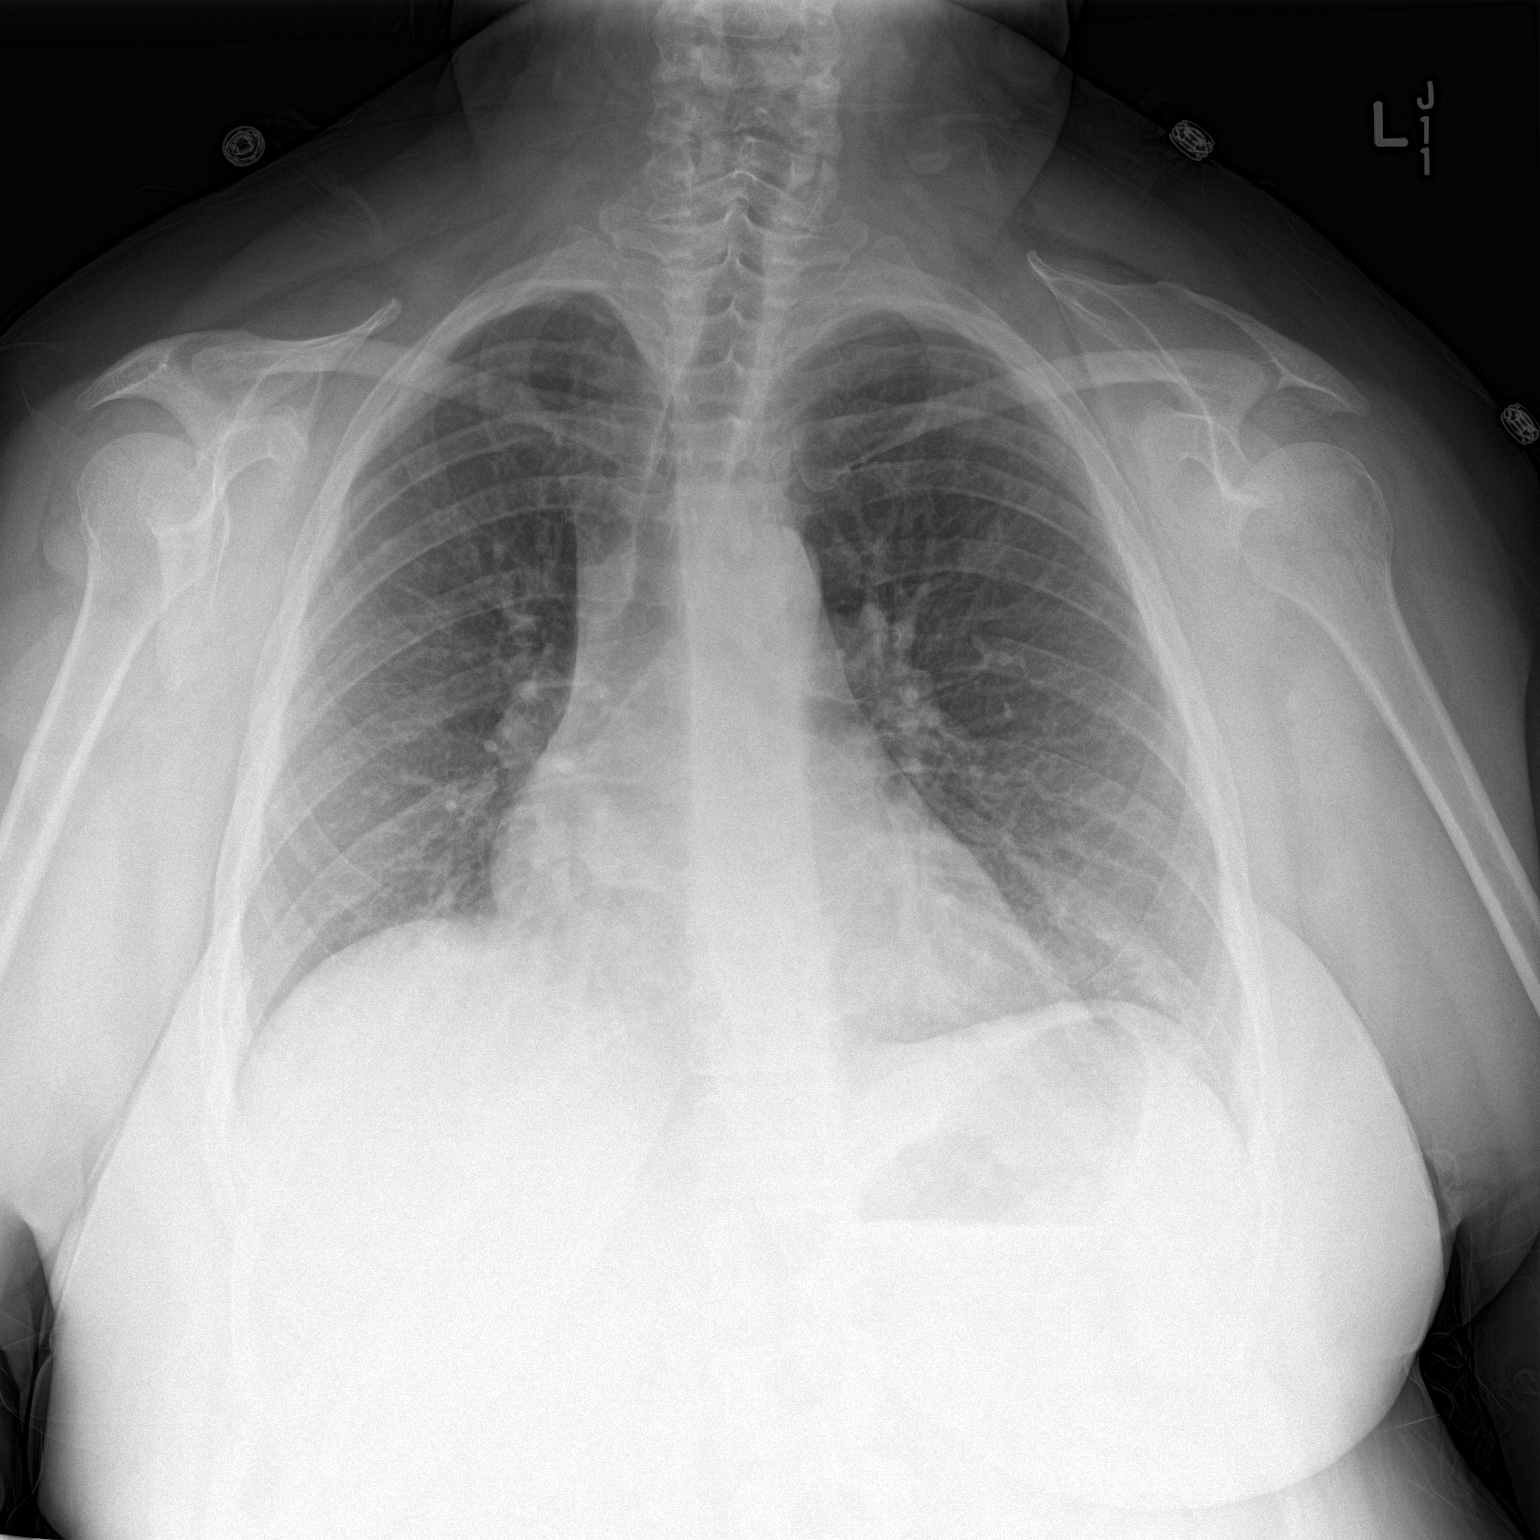

[chest lat]
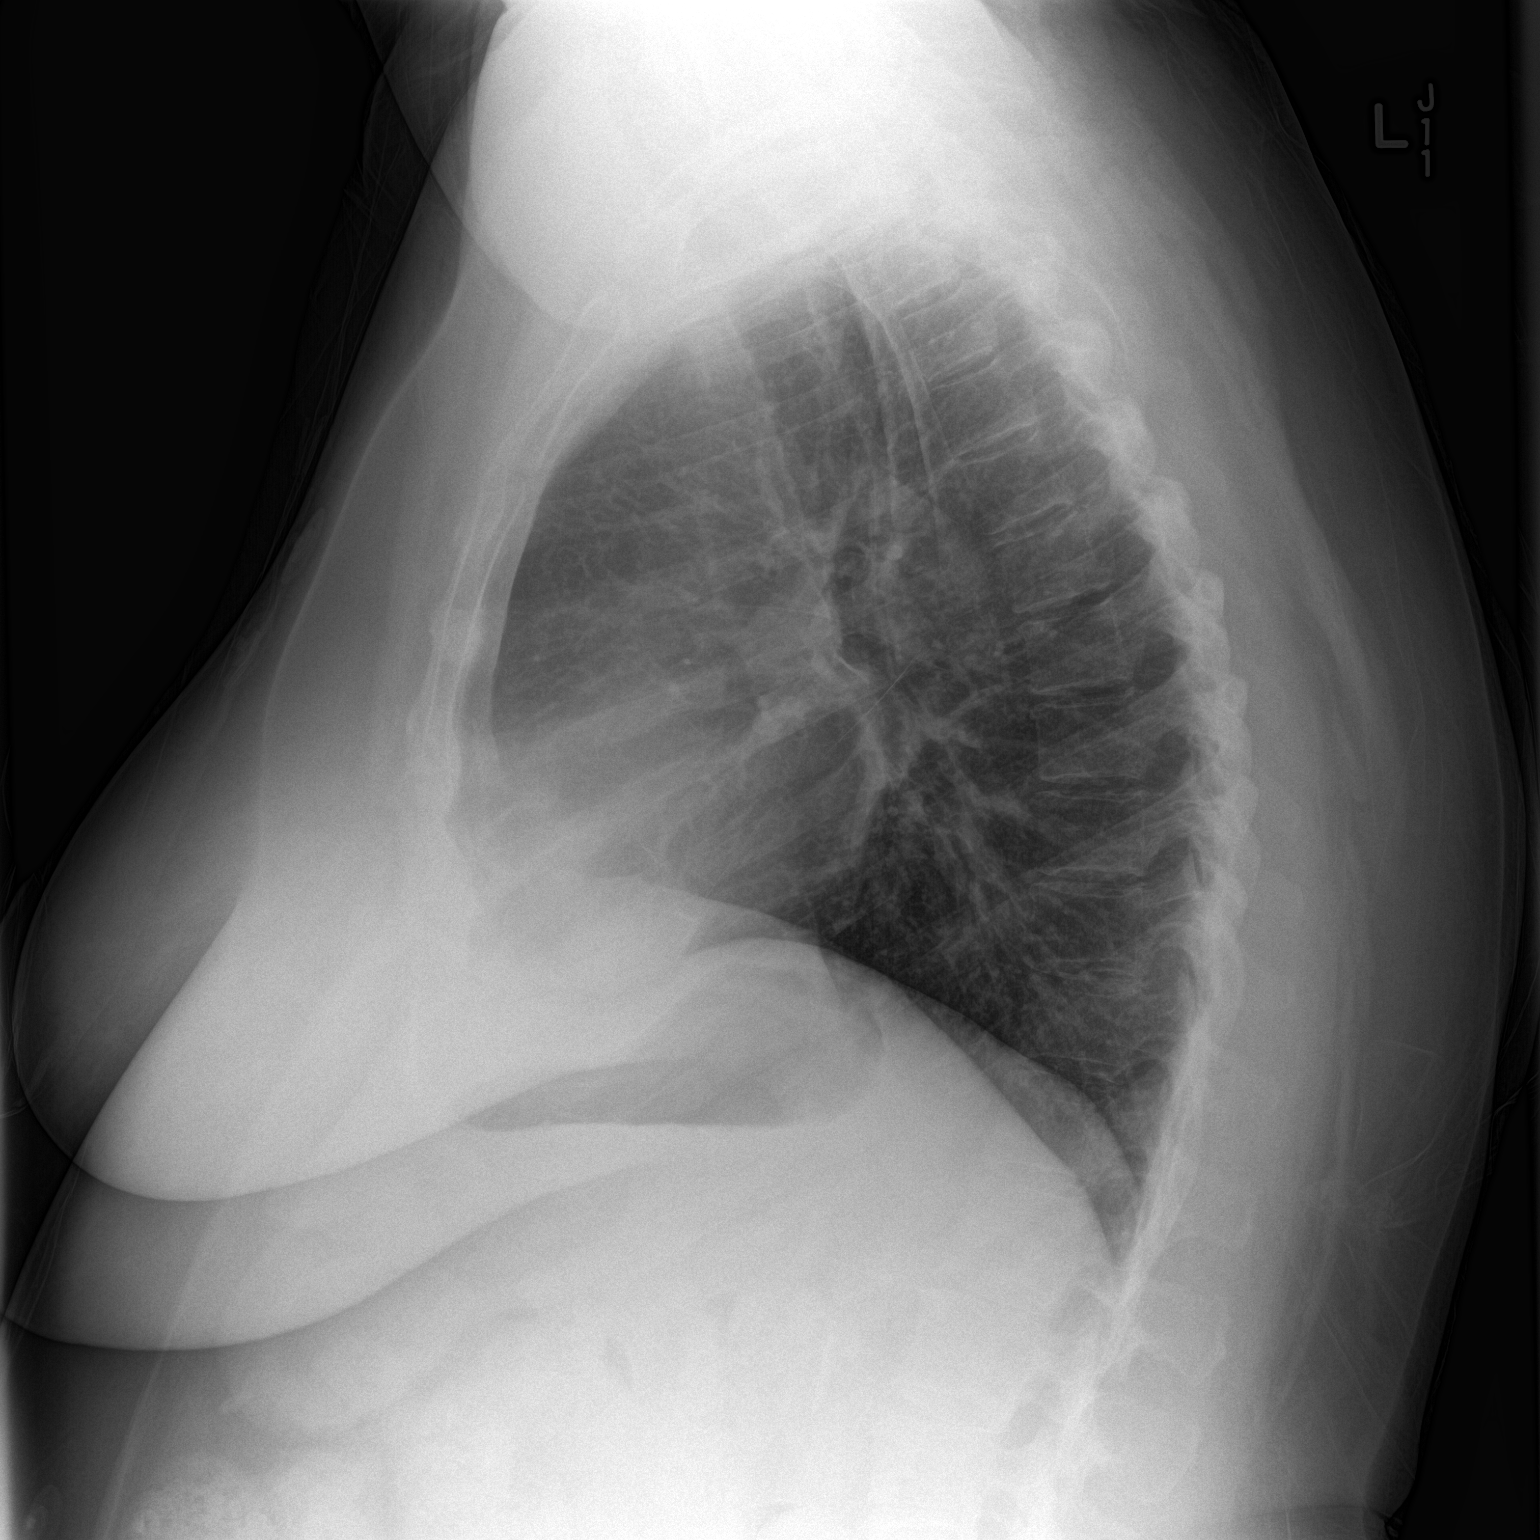

[2 of 2 positions shown; findings below may reference images not displayed]

FINDINGS: The lungs are clear. Heart size is normal. There is no pneumothorax
or pleural effusion.
IMPRESSION: No acute disease

## 2015-05-28 IMAGING — US US RENAL
1 series · 14 of 25 positions shown · non-contrast
Comparison: None.

CLINICAL DATA: Acute kidney injury.  History diabetes.

EXAM:
RENAL/URINARY TRACT ULTRASOUND COMPLETE

[Series 1: us renal · 0.22mm/px · 14 of 32 slices shown]
[im 1/32]
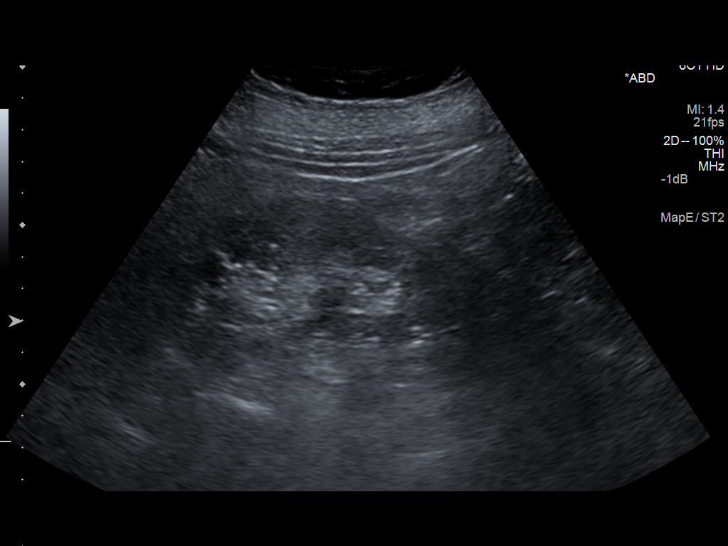
[im 3/32]
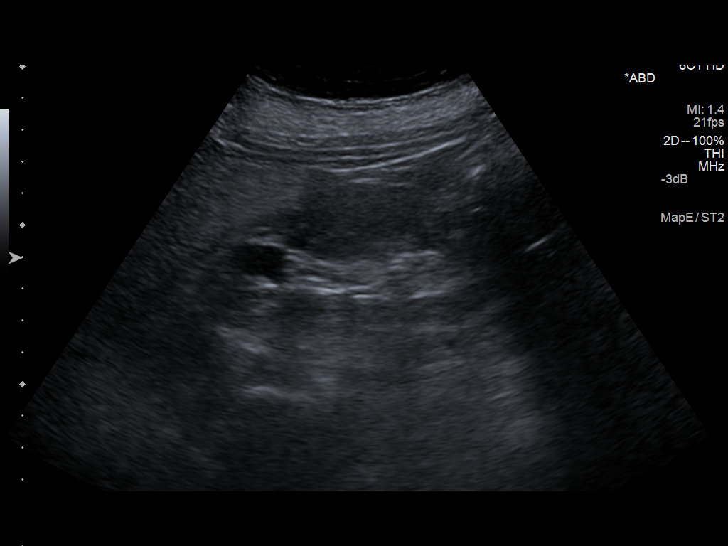
[im 6/32]
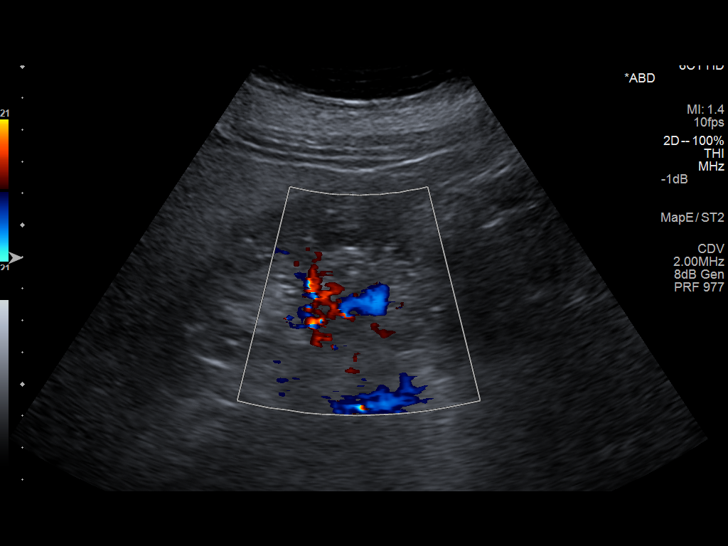
[im 8/32]
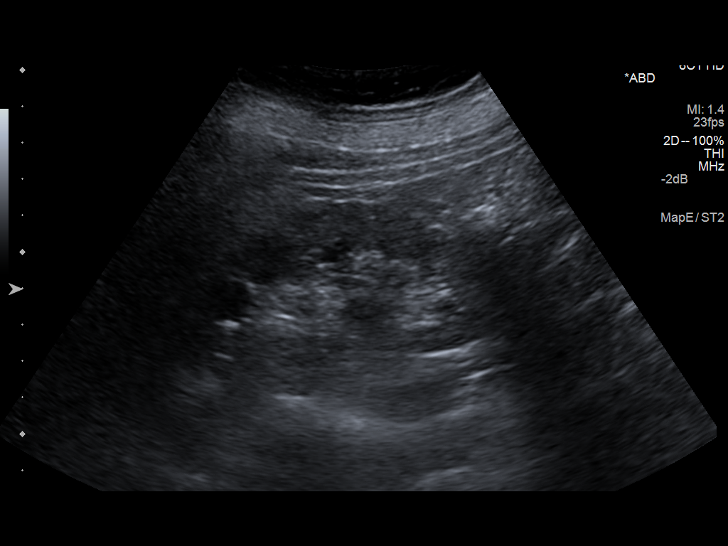
[im 11/32]
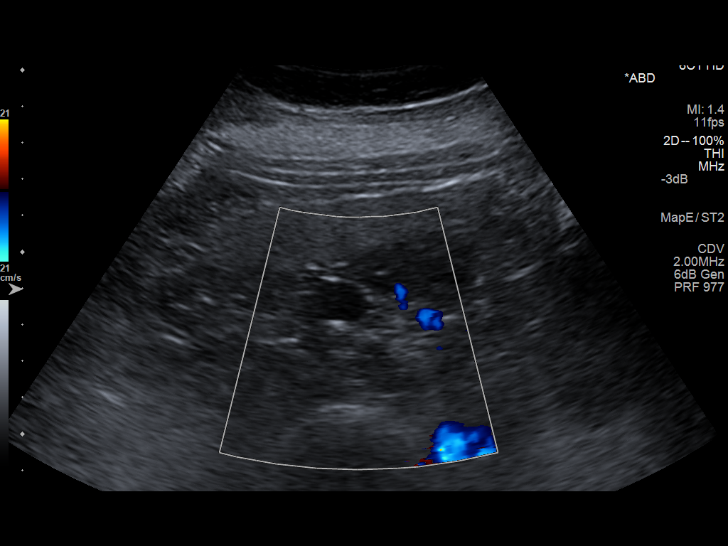
[im 12/32]
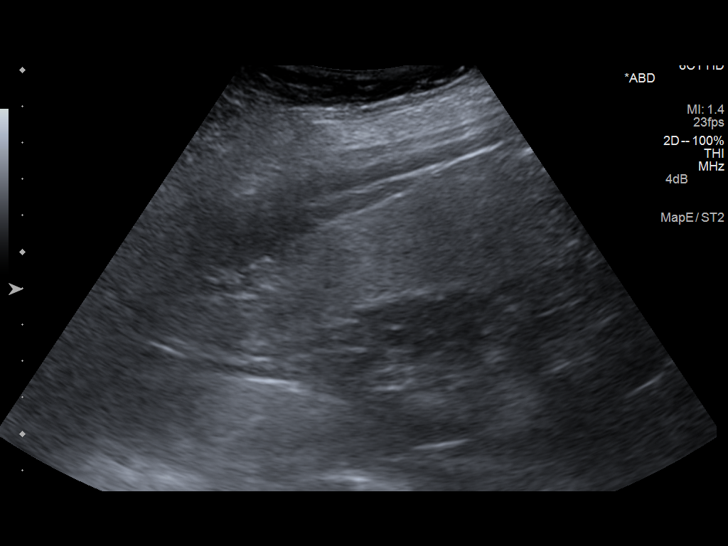
[im 15/32]
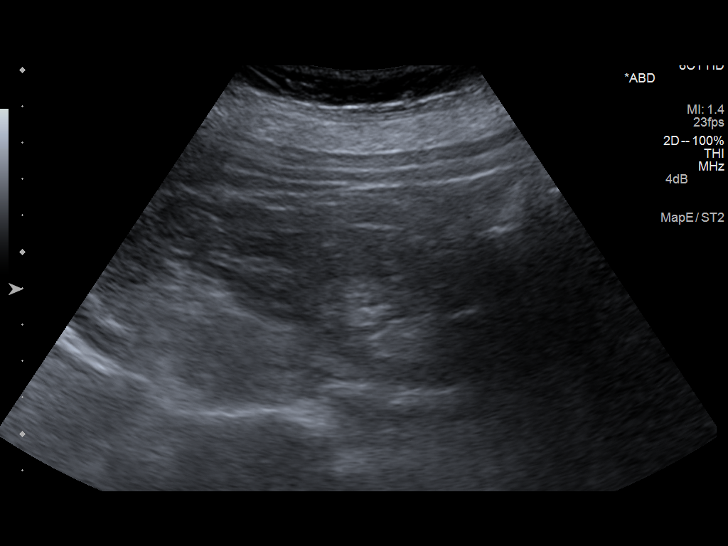
[im 17/32]
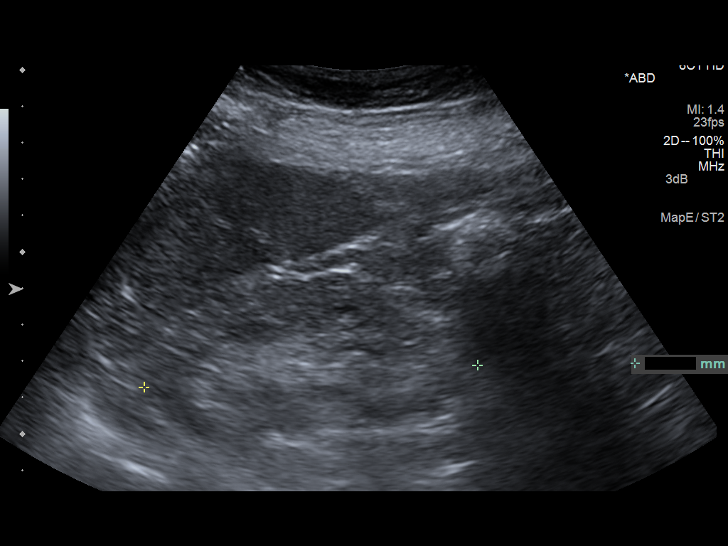
[im 20/32]
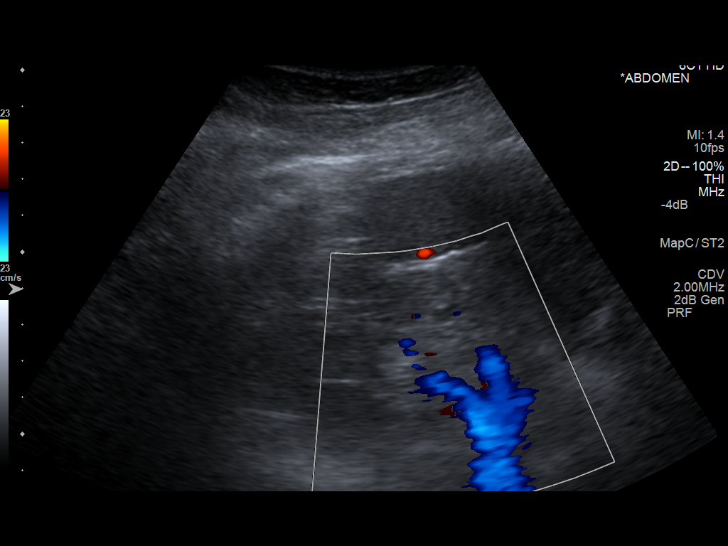
[im 21/32]
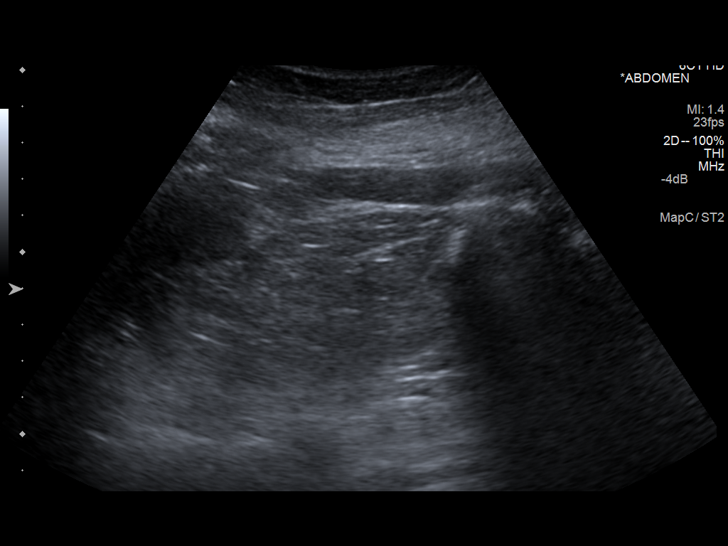
[im 24/32]
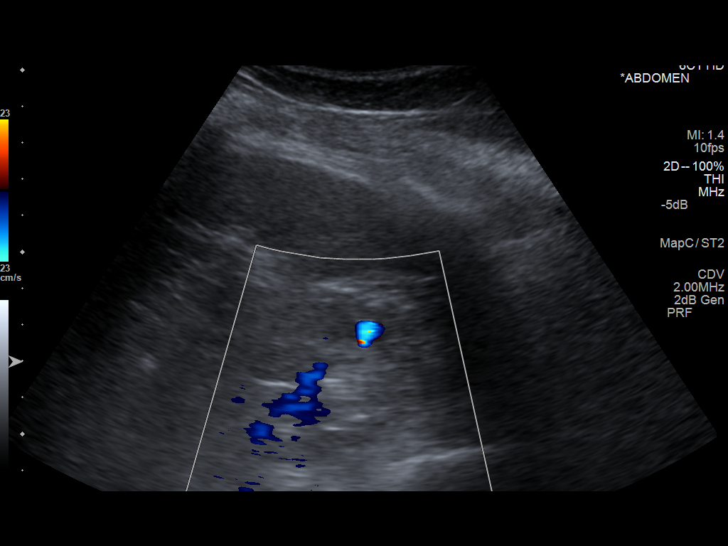
[im 26/32]
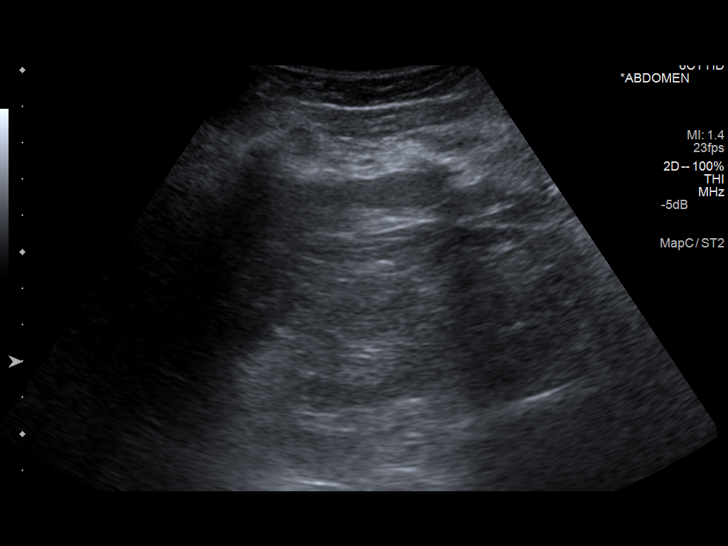
[im 29/32]
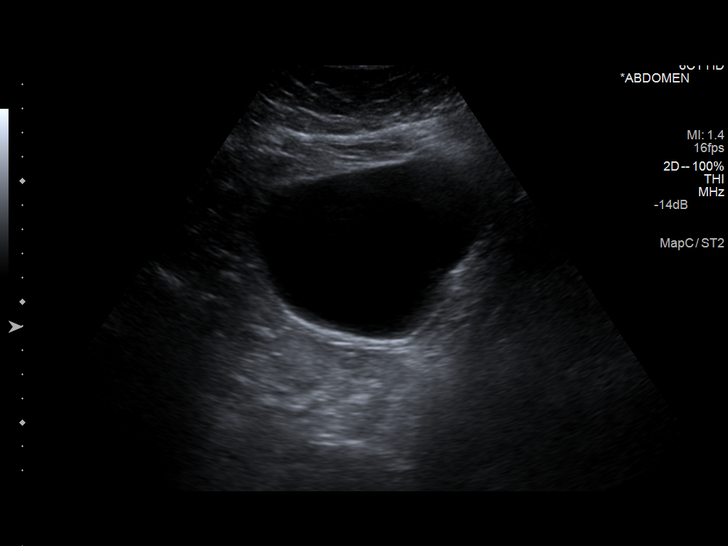
[im 32/32]
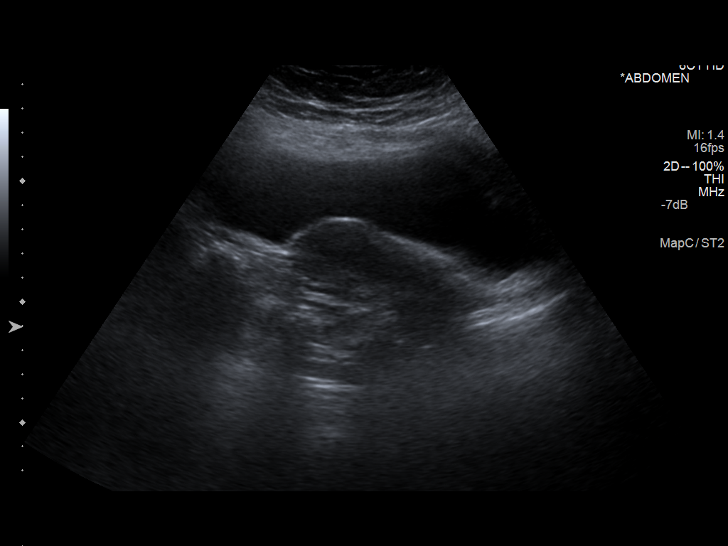

[14 of 25 positions shown; findings below may reference images not displayed]

FINDINGS: Right Kidney:

Length: 9.9 cm. Increased renal cortical echogenicity. No
hydronephrosis visualized. 1.7 x 1.2 x 1.5 cm hypoechoic right upper
pole renal mass most consistent with a cyst.

Left Kidney:

Length: 9.2 cm. Increased renal cortical echogenicity. No mass or
hydronephrosis visualized.

Bladder:

Appears normal for degree of bladder distention.
IMPRESSION: 1. Bilateral echogenic kidneys as can be seen with medical renal
disease.
2. No obstructive uropathy.

## 2015-06-03 ENCOUNTER — Ambulatory Visit
Admission: RE | Admit: 2015-06-03 | Discharge: 2015-06-03 | Disposition: A | Discharge status: Home / Self Care | Payer: Medicare Other | Source: Ambulatory Visit | Attending: Family Medicine | Admitting: Family Medicine

## 2015-06-03 DIAGNOSIS — E2839 Other primary ovarian failure: Secondary | ICD-10-CM

## 2015-08-22 ENCOUNTER — Emergency Department (HOSPITAL_COMMUNITY)
Admission: EM | Admit: 2015-08-22 | Discharge: 2015-08-22 | Disposition: A | Discharge status: Home / Self Care | Payer: No Typology Code available for payment source | Attending: Emergency Medicine | Admitting: Emergency Medicine

## 2015-08-22 ENCOUNTER — Emergency Department (HOSPITAL_COMMUNITY): Payer: No Typology Code available for payment source

## 2015-08-22 DIAGNOSIS — Y939 Activity, unspecified: Secondary | ICD-10-CM | POA: Diagnosis not present

## 2015-08-22 DIAGNOSIS — N183 Chronic kidney disease, stage 3 (moderate): Secondary | ICD-10-CM | POA: Insufficient documentation

## 2015-08-22 DIAGNOSIS — Y999 Unspecified external cause status: Secondary | ICD-10-CM | POA: Diagnosis not present

## 2015-08-22 DIAGNOSIS — Z7982 Long term (current) use of aspirin: Secondary | ICD-10-CM | POA: Diagnosis not present

## 2015-08-22 DIAGNOSIS — Z7984 Long term (current) use of oral hypoglycemic drugs: Secondary | ICD-10-CM | POA: Insufficient documentation

## 2015-08-22 DIAGNOSIS — J45909 Unspecified asthma, uncomplicated: Secondary | ICD-10-CM | POA: Insufficient documentation

## 2015-08-22 DIAGNOSIS — M1712 Unilateral primary osteoarthritis, left knee: Secondary | ICD-10-CM | POA: Diagnosis not present

## 2015-08-22 DIAGNOSIS — M542 Cervicalgia: Secondary | ICD-10-CM | POA: Diagnosis not present

## 2015-08-22 DIAGNOSIS — F3160 Bipolar disorder, current episode mixed, unspecified: Secondary | ICD-10-CM | POA: Insufficient documentation

## 2015-08-22 DIAGNOSIS — Y92410 Unspecified street and highway as the place of occurrence of the external cause: Secondary | ICD-10-CM | POA: Insufficient documentation

## 2015-08-22 DIAGNOSIS — R0789 Other chest pain: Secondary | ICD-10-CM | POA: Diagnosis not present

## 2015-08-22 DIAGNOSIS — E1122 Type 2 diabetes mellitus with diabetic chronic kidney disease: Secondary | ICD-10-CM | POA: Insufficient documentation

## 2015-08-22 NOTE — ED Notes (Signed)
Pt transported to XR.  
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
## 2016-02-05 ENCOUNTER — Encounter: Payer: Self-pay | Admitting: Nephrology

## 2016-02-05 DIAGNOSIS — D509 Iron deficiency anemia, unspecified: Secondary | ICD-10-CM | POA: Insufficient documentation

## 2016-05-05 ENCOUNTER — Emergency Department (HOSPITAL_COMMUNITY)
Admission: EM | Admit: 2016-05-05 | Discharge: 2016-05-05 | Disposition: A | Discharge status: Home / Self Care | Payer: Medicare Other | Attending: Emergency Medicine | Admitting: Emergency Medicine

## 2016-05-05 ENCOUNTER — Encounter (HOSPITAL_COMMUNITY): Payer: Self-pay | Admitting: Emergency Medicine

## 2016-05-05 ENCOUNTER — Other Ambulatory Visit: Payer: Self-pay

## 2016-05-05 DIAGNOSIS — E039 Hypothyroidism, unspecified: Secondary | ICD-10-CM | POA: Diagnosis not present

## 2016-05-05 DIAGNOSIS — E1122 Type 2 diabetes mellitus with diabetic chronic kidney disease: Secondary | ICD-10-CM | POA: Insufficient documentation

## 2016-05-05 DIAGNOSIS — J45909 Unspecified asthma, uncomplicated: Secondary | ICD-10-CM | POA: Insufficient documentation

## 2016-05-05 DIAGNOSIS — N183 Chronic kidney disease, stage 3 (moderate): Secondary | ICD-10-CM | POA: Diagnosis not present

## 2016-05-05 DIAGNOSIS — E11649 Type 2 diabetes mellitus with hypoglycemia without coma: Secondary | ICD-10-CM | POA: Insufficient documentation

## 2016-05-05 DIAGNOSIS — Z7982 Long term (current) use of aspirin: Secondary | ICD-10-CM | POA: Insufficient documentation

## 2016-05-05 DIAGNOSIS — Z79899 Other long term (current) drug therapy: Secondary | ICD-10-CM | POA: Insufficient documentation

## 2016-05-05 DIAGNOSIS — I129 Hypertensive chronic kidney disease with stage 1 through stage 4 chronic kidney disease, or unspecified chronic kidney disease: Secondary | ICD-10-CM | POA: Insufficient documentation

## 2016-05-05 DIAGNOSIS — E1142 Type 2 diabetes mellitus with diabetic polyneuropathy: Secondary | ICD-10-CM | POA: Insufficient documentation

## 2016-05-05 DIAGNOSIS — E162 Hypoglycemia, unspecified: Secondary | ICD-10-CM

## 2016-05-05 DIAGNOSIS — R42 Dizziness and giddiness: Secondary | ICD-10-CM | POA: Diagnosis present

## 2016-05-05 LAB — CBC
HCT: 43.2 % (ref 36.0–46.0)
HEMOGLOBIN: 13.9 g/dL (ref 12.0–15.0)
MCH: 31 pg (ref 26.0–34.0)
MCHC: 32.2 g/dL (ref 30.0–36.0)
MCV: 96.2 fL (ref 78.0–100.0)
Platelets: 200 10*3/uL (ref 150–400)
RBC: 4.49 MIL/uL (ref 3.87–5.11)
RDW: 13.7 % (ref 11.5–15.5)
WBC: 12 10*3/uL — AB (ref 4.0–10.5)

## 2016-05-05 LAB — BASIC METABOLIC PANEL
Anion gap: 13 (ref 5–15)
BUN: 24 mg/dL — ABNORMAL HIGH (ref 6–20)
CO2: 28 mmol/L (ref 22–32)
Calcium: 10 mg/dL (ref 8.9–10.3)
Chloride: 101 mmol/L (ref 101–111)
Creatinine, Ser: 1.8 mg/dL — ABNORMAL HIGH (ref 0.44–1.00)
GFR calc Af Amer: 34 mL/min — ABNORMAL LOW (ref >60)
GFR calc non Af Amer: 29 mL/min — ABNORMAL LOW (ref >60)
Glucose, Bld: 59 mg/dL — ABNORMAL LOW (ref 65–99)
Potassium: 4.2 mmol/L (ref 3.5–5.1)
Sodium: 142 mmol/L (ref 135–145)

## 2016-05-05 LAB — CBG MONITORING, ED
Glucose-Capillary: 62 mg/dL — ABNORMAL LOW (ref 65–99)
Glucose-Capillary: 64 mg/dL — ABNORMAL LOW (ref 65–99)
Glucose-Capillary: 79 mg/dL (ref 65–99)

## 2016-05-05 NOTE — ED Provider Notes (Signed)
[facility] DEPT Provider Note   CSN: [id number] Arrival date & time: 05/05/16  0157     History   Chief Complaint Chief Complaint  Patient presents with  . Dizziness    HPI Denise Serrano is a 61 y.o. female PMH of CKD, bipolar, DM, here with dizziness.  Patient states this occurred with sudden onset.  She has been having a lot of stress recently and has not been eating as well.  She states she has still be compliant with her medications throughout this time.  She also started a new medication for her bipolar and thought this was a side effect. She did not think to check her blood sugar at home.  She denies syncope or other neurological symptoms.  There are no further complaints.  She denies any dehydration or recent illness.  10 Systems reviewed and are negative for acute change except as noted in the HPI.   HPI  Past Medical History:  Diagnosis Date  . Arthritis of knee, left   . Asthma   . Bipolar 1 disorder, mixed ([place])   . Cataract immature BILATERAL  . Chronic kidney disease (CKD), stage III (moderate) MONITORED BY DR GOLDSBOROUGH  . Diabetes mellitus without complication ([place])   . Elevated blood pressure (not hypertension) borderline-  monitored  . Impaired hearing hearing aid- bilateral  . Nocturia   . OSA on CPAP   . Seasonal allergies   . Stress incontinence, female   . Urgency of urination     Patient Active Problem List   Diagnosis Date Noted  . Anemia, iron deficiency 02/05/2016  . Metabolic acidosis 74/10/1446  . Anemia of renal disease 07/01/2014  . Acute renal failure ([place]) 06/28/2014  . Asthma exacerbation 06/28/2014  . Dizziness 06/28/2014  . GERD (gastroesophageal reflux disease) 06/28/2014  . AKI (acute kidney injury) ([place]) 05/03/2014  . Bipolar disorder ([place]) 05/03/2014  . Essential hypertension 05/03/2014  . Diarrhea of presumed infectious origin 05/03/2014  . Hypothyroidism 05/03/2014  . Hyperlipidemia 05/03/2014  . Gait difficulty  11/15/2013  . Type 2 diabetes mellitus with diabetic chronic kidney disease ([place]) 11/14/2013  . Diabetic polyneuropathy associated with type 2 diabetes mellitus ([place]) 11/14/2013  . Ingrown left big toenail 10/22/2010    Past Surgical History:  Procedure Laterality Date  . BLADDER SUSPENSION  05/07/2011   Procedure: [facility] PROCEDURE;  Surgeon: Reece Packer, MD;  Location: [facility];  Service: Urology;  Laterality: N/A;  cysto, sparc sling   . DX LAPAROSCOPY FOR INFERTILITY  27 YRS AGO  . TUBAL LIGATION  20 YRS AGO    OB History    No data available       Home Medications    Prior to Admission medications   Medication Sig Start Date End Date Taking? Authorizing Provider  albuterol (PROVENTIL) (2.5 MG/3ML) 0.083% nebulizer solution Take 3 mLs (2.5 mg total) by nebulization every 4 (four) hours as needed for wheezing or shortness of breath. 07/01/14  Yes Janece Canterbury, MD  ALPRAZolam Duanne Moron) 0.5 MG tablet Take 0.5 mg by mouth 3 (three) times daily as needed for anxiety (anxiety). For anxiety.   Yes Historical Provider, MD  amLODipine (NORVASC) 2.5 MG tablet Take 2.5 mg by mouth daily. 04/27/16  Yes Historical Provider, MD  aspirin EC 81 MG tablet Take 81 mg by mouth daily.   Yes Historical Provider, MD  atorvastatin (LIPITOR) 20 MG tablet Take 20 mg by mouth daily. 03/25/16  Yes Historical Provider, MD  budesonide-formoterol (SYMBICORT)  160-4.5 MCG/ACT inhaler Inhale 2 puffs into the lungs 2 (two) times daily.   Yes Historical Provider, MD  buPROPion (WELLBUTRIN XL) 300 MG 24 hr tablet Take 300 mg by mouth daily.   Yes Historical Provider, MD  carbamazepine (TEGRETOL XR) 200 MG 12 hr tablet Take 200 mg by mouth 2 (two) times daily. 04/24/16  Yes Historical Provider, MD  gabapentin (NEURONTIN) 300 MG capsule Take 300 mg by mouth 2 (two) times daily. 03/25/16  Yes Historical Provider, MD  glimepiride (AMARYL) 4 MG tablet Take 4 mg by mouth daily. 04/03/16  Yes Historical  Provider, MD  lamoTRIgine (LAMICTAL) 150 MG tablet Take 150 mg by mouth daily. Take with Lamictal 200 mg to equal 450 mg 04/24/16  Yes Historical Provider, MD  lamoTRIgine (LAMICTAL) 200 MG tablet Take 300 mg by mouth daily. Take with lamictal 150 mg to equal 450 mg 04/21/16  Yes Historical Provider, MD  levothyroxine (SYNTHROID, LEVOTHROID) 50 MCG tablet Take 50 mcg by mouth daily. 03/25/16  Yes Historical Provider, MD  metroNIDAZOLE (METROGEL) 1 % gel Apply topically 2 (two) times daily. Inside ears and forehead 06/24/15 06/23/16 Yes Historical Provider, MD  mirabegron ER (MYRBETRIQ) 50 MG TB24 tablet Take 50 mg by mouth daily.   Yes Historical Provider, MD  montelukast (SINGULAIR) 5 MG chewable tablet Chew 5 mg by mouth daily. Take 5mg  per patient   Yes Historical Provider, MD  Multiple Vitamin (MULTIVITAMIN WITH MINERALS) TABS tablet Take 1 tablet by mouth 2 (two) times daily.   Yes Historical Provider, MD  Omega-3 Fatty Acids (FISH OIL) 1200 MG CAPS Take 1 capsule by mouth every evening.   Yes Historical Provider, MD  ranitidine (ZANTAC) 300 MG tablet Take 300 mg by mouth daily. 04/24/16  Yes Historical Provider, MD  sitaGLIPtin (JANUVIA) 50 MG tablet Take 25 mg by mouth daily. 03/25/16  Yes Historical Provider, MD  tazarotene (AVAGE) 0.1 % cream Apply topically at bedtime.   Yes Historical Provider, MD  doxycycline (VIBRA-TABS) 100 MG tablet Take 1 tablet (100 mg total) by mouth every 12 (twelve) hours. Patient not taking: Reported on 05/05/2016 07/02/14   Janece Canterbury, MD  glimepiride (AMARYL) 1 MG tablet Take 1 tablet (1 mg total) by mouth daily with breakfast. Patient not taking: Reported on 05/05/2016 05/06/14   Thurnell Lose, MD  predniSONE (DELTASONE) 20 MG tablet Take 2 tablets (40 mg total) by mouth daily with breakfast. Patient not taking: Reported on 05/05/2016 07/01/14   Janece Canterbury, MD  sitaGLIPtin (JANUVIA) 25 MG tablet Take 1 tablet (25 mg total) by mouth daily. Patient not taking:  Reported on 05/05/2016 05/10/14   Thurnell Lose, MD  sodium bicarbonate 650 MG tablet Take 2 tablets (1,300 mg total) by mouth 2 (two) times daily. Patient not taking: Reported on 05/05/2016 07/01/14   Janece Canterbury, MD    Family History Family History  Problem Relation Age of Onset  . Heart attack Mother   . Heart attack Father     Social History Social History  Substance Use Topics  . Smoking status: Never Smoker  . Smokeless tobacco: Never Used  . Alcohol use Yes     Comment: social     Allergies   Amoxicillin   Review of Systems Review of Systems   Physical Exam Updated Vital Signs BP 169/76   Pulse 89   Resp 17   Ht 5' (1.524 m)   SpO2 92%   Physical Exam  Constitutional: She is oriented to person,  place, and time. She appears well-developed and well-nourished. No distress.  HENT:  Head: Normocephalic and atraumatic.  Nose: Nose normal.  Mouth/Throat: Oropharynx is clear and moist. No oropharyngeal exudate.  Eyes: Conjunctivae and EOM are normal. Pupils are equal, round, and reactive to light. No scleral icterus.  Neck: Normal range of motion. Neck supple. No JVD present. No tracheal deviation present. No thyromegaly present.  Cardiovascular: Normal rate, regular rhythm and normal heart sounds.  Exam reveals no gallop and no friction rub.   No murmur heard. Pulmonary/Chest: Effort normal and breath sounds normal. No respiratory distress. She has no wheezes. She exhibits no tenderness.  Abdominal: Soft. Bowel sounds are normal. She exhibits no distension and no mass. There is no tenderness. There is no rebound and no guarding.  Musculoskeletal: Normal range of motion. She exhibits no edema or tenderness.  Lymphadenopathy:    She has no cervical adenopathy.  Neurological: She is alert and oriented to person, place, and time. No cranial nerve deficit. She exhibits normal muscle tone.  Normal strength and sensation in all extremities. Normal cerebellar testing.  Normal gait.  Skin: Skin is warm and dry. No rash noted. No erythema. No pallor.  Nursing note and vitals reviewed.    ED Treatments / Results  Labs (all labs ordered are listed, but only abnormal results are displayed) Labs Reviewed  BASIC METABOLIC PANEL - Abnormal; Notable for the following:       Result Value   Glucose, Bld 59 (*)    BUN 24 (*)    Creatinine, Ser 1.80 (*)    GFR calc non Af Amer 29 (*)    GFR calc Af Amer 34 (*)    All other components within normal limits  CBC - Abnormal; Notable for the following:    WBC 12.0 (*)    All other components within normal limits  URINALYSIS, ROUTINE W REFLEX MICROSCOPIC  CBG MONITORING, ED  CBG MONITORING, ED    EKG  EKG Interpretation None       Radiology No results found.  Procedures Procedures (including critical care time)  Medications Ordered in ED Medications - No data to display   Initial Impression / Assessment and Plan / ED Course  I have reviewed the triage vital signs and the nursing notes.  Pertinent labs & imaging results that were available during my care of the patient were reviewed by me and considered in my medical decision making (see chart for details).     Patient presents to the ED for lightheadedness.  BS was 59 and this was likely the cause.  She was immediately given food in the ED.  Repeat was 79.  Electrolytes are normal.  Will re check BS one more time prior to DC.  She was advised on change in appetites and insulin coverage.  She will no check her BS at home more frequently. PCP fu also advised for any medication changes. She appears well and in NAD. VS remain within her normal limits and she is safe for DC.  Final Clinical Impressions(s) / ED Diagnoses   Final diagnoses:  None    New Prescriptions New Prescriptions   No medications on file     Everlene Balls, MD 05/05/16 [phone]

## 2016-05-05 NOTE — ED Triage Notes (Signed)
Per [facility]  Sudden onset blurred vision and lack of coordination. Stumbling gait while ambulating. Stroke scale negative with EMS. Pt recent changes to bipolar meds. Pt endorses more stress recently. Pt anxious upon arrival.

## 2016-05-22 ENCOUNTER — Encounter (HOSPITAL_COMMUNITY): Payer: Self-pay

## 2016-05-22 ENCOUNTER — Emergency Department (HOSPITAL_COMMUNITY)
Admission: EM | Admit: 2016-05-22 | Discharge: 2016-05-22 | Disposition: A | Discharge status: Home / Self Care | Payer: Medicare Other | Attending: Emergency Medicine | Admitting: Emergency Medicine

## 2016-05-22 DIAGNOSIS — N183 Chronic kidney disease, stage 3 (moderate): Secondary | ICD-10-CM | POA: Insufficient documentation

## 2016-05-22 DIAGNOSIS — E1122 Type 2 diabetes mellitus with diabetic chronic kidney disease: Secondary | ICD-10-CM | POA: Insufficient documentation

## 2016-05-22 DIAGNOSIS — I10 Essential (primary) hypertension: Secondary | ICD-10-CM

## 2016-05-22 DIAGNOSIS — Z7984 Long term (current) use of oral hypoglycemic drugs: Secondary | ICD-10-CM | POA: Diagnosis not present

## 2016-05-22 DIAGNOSIS — I129 Hypertensive chronic kidney disease with stage 1 through stage 4 chronic kidney disease, or unspecified chronic kidney disease: Secondary | ICD-10-CM | POA: Insufficient documentation

## 2016-05-22 DIAGNOSIS — E11649 Type 2 diabetes mellitus with hypoglycemia without coma: Secondary | ICD-10-CM | POA: Insufficient documentation

## 2016-05-22 DIAGNOSIS — H538 Other visual disturbances: Secondary | ICD-10-CM

## 2016-05-22 DIAGNOSIS — J45909 Unspecified asthma, uncomplicated: Secondary | ICD-10-CM | POA: Diagnosis not present

## 2016-05-22 DIAGNOSIS — Z7982 Long term (current) use of aspirin: Secondary | ICD-10-CM | POA: Diagnosis not present

## 2016-05-22 DIAGNOSIS — E162 Hypoglycemia, unspecified: Secondary | ICD-10-CM

## 2016-05-22 LAB — COMPREHENSIVE METABOLIC PANEL
ALBUMIN: 3.9 g/dL (ref 3.5–5.0)
ALT: 21 U/L (ref 14–54)
AST: 14 U/L — AB (ref 15–41)
Alkaline Phosphatase: 108 U/L (ref 38–126)
Anion gap: 11 (ref 5–15)
BUN: 41 mg/dL — AB (ref 6–20)
CHLORIDE: 102 mmol/L (ref 101–111)
CO2: 28 mmol/L (ref 22–32)
Calcium: 9.9 mg/dL (ref 8.9–10.3)
Creatinine, Ser: 1.87 mg/dL — ABNORMAL HIGH (ref 0.44–1.00)
GFR calc Af Amer: 32 mL/min — ABNORMAL LOW (ref >60)
GFR, EST NON AFRICAN AMERICAN: 28 mL/min — AB (ref >60)
GLUCOSE: 64 mg/dL — AB (ref 65–99)
POTASSIUM: 4.6 mmol/L (ref 3.5–5.1)
SODIUM: 141 mmol/L (ref 135–145)
Total Bilirubin: 0.5 mg/dL (ref 0.3–1.2)
Total Protein: 7.7 g/dL (ref 6.5–8.1)

## 2016-05-22 LAB — CBC WITH DIFFERENTIAL/PLATELET
BASOS PCT: 0 %
Basophils Absolute: 0.1 10*3/uL (ref 0.0–0.1)
Eosinophils Absolute: 0.3 10*3/uL (ref 0.0–0.7)
Eosinophils Relative: 2 %
HCT: 40.8 % (ref 36.0–46.0)
Hemoglobin: 13.2 g/dL (ref 12.0–15.0)
Lymphocytes Relative: 29 %
Lymphs Abs: 3.4 10*3/uL (ref 0.7–4.0)
MCH: 31.2 pg (ref 26.0–34.0)
MCHC: 32.4 g/dL (ref 30.0–36.0)
MCV: 96.5 fL (ref 78.0–100.0)
MONO ABS: 0.7 10*3/uL (ref 0.1–1.0)
Monocytes Relative: 6 %
Neutro Abs: 7.2 10*3/uL (ref 1.7–7.7)
Neutrophils Relative %: 63 %
PLATELETS: 259 10*3/uL (ref 150–400)
RBC: 4.23 MIL/uL (ref 3.87–5.11)
RDW: 14 % (ref 11.5–15.5)
WBC: 11.6 10*3/uL — AB (ref 4.0–10.5)

## 2016-05-22 LAB — CBG MONITORING, ED
GLUCOSE-CAPILLARY: 69 mg/dL (ref 65–99)
GLUCOSE-CAPILLARY: 115 mg/dL — AB (ref 65–99)
GLUCOSE-CAPILLARY: 173 mg/dL — AB (ref 65–99)
GLUCOSE-CAPILLARY: 161 mg/dL — AB (ref 65–99)
Glucose-Capillary: 102 mg/dL — ABNORMAL HIGH (ref 65–99)

## 2016-05-22 LAB — URINALYSIS, ROUTINE W REFLEX MICROSCOPIC
Bacteria, UA: NONE SEEN
Bilirubin Urine: NEGATIVE
GLUCOSE, UA: NEGATIVE mg/dL
Hgb urine dipstick: NEGATIVE
KETONES UR: NEGATIVE mg/dL
Nitrite: NEGATIVE
PROTEIN: 30 mg/dL — AB
Specific Gravity, Urine: 1.009 (ref 1.005–1.030)
pH: 6 (ref 5.0–8.0)

## 2016-05-22 NOTE — ED Provider Notes (Signed)
TIME SEEN: 4:20 AM  CHIEF COMPLAINT: Blurred vision  HPI: Pt is a 61 y.o. female with history of diabetes on Januvia and amaryl, chronic kidney disease, bipolar disorder who presents to the emergency department with blurry vision that started around 10 PM. She states that she thought it could be because her blood sugar was dropping. She states that she takes her diabetes medications in the morning. There has not been a recent change in these medications. She states at 7:30 PM her blood sugar was 81. She then checked it at 10 PM when she began having symptoms and it was 136. She checked it again at 11:20 PM it was 179. At 12:45 AM it was 134 and that at 12:51 AM and it was 120. It concerned her that it was "dropping so quickly". That is why she came to the hospital. She does report that she took Xanax and Benadryl at 9 PM prior to her vision becoming blurry. There is no vision loss. No headache, numbness or focal weakness, facial droop, slurred speech. No chest pain or shortness of breath. No recent fevers, cough, vomiting or diarrhea. Family was also concerned because her blood pressure was elevated at home. Patient is now asymptomatic. Has no current complaints.  ROS: See HPI Constitutional: no fever  Eyes: no drainage  ENT: no runny nose   Cardiovascular:  no chest pain  Resp: no SOB  GI: no vomiting GU: no dysuria Integumentary: no rash  Allergy: no hives  Musculoskeletal: no leg swelling  Neurological: no slurred speech ROS otherwise negative  PAST MEDICAL HISTORY/PAST SURGICAL HISTORY:  Past Medical History:  Diagnosis Date  . Arthritis of knee, left   . Asthma   . Bipolar 1 disorder, mixed ([place])   . Cataract immature BILATERAL  . Chronic kidney disease (CKD), stage III (moderate) MONITORED BY DR GOLDSBOROUGH  . Diabetes mellitus without complication ([place])   . Elevated blood pressure (not hypertension) borderline-  monitored  . Impaired hearing hearing aid- bilateral  . Nocturia    . OSA on CPAP   . Seasonal allergies   . Stress incontinence, female   . Urgency of urination     MEDICATIONS:  Prior to Admission medications   Medication Sig Start Date End Date Taking? Authorizing Provider  albuterol (PROVENTIL) (2.5 MG/3ML) 0.083% nebulizer solution Take 3 mLs (2.5 mg total) by nebulization every 4 (four) hours as needed for wheezing or shortness of breath. 07/01/14   Janece Canterbury, MD  ALPRAZolam Duanne Moron) 0.5 MG tablet Take 0.5 mg by mouth 3 (three) times daily as needed for anxiety (anxiety). For anxiety.    Historical Provider, MD  amLODipine (NORVASC) 2.5 MG tablet Take 2.5 mg by mouth daily. 04/27/16   Historical Provider, MD  aspirin EC 81 MG tablet Take 81 mg by mouth daily.    Historical Provider, MD  atorvastatin (LIPITOR) 20 MG tablet Take 20 mg by mouth daily. 03/25/16   Historical Provider, MD  budesonide-formoterol (SYMBICORT) 160-4.5 MCG/ACT inhaler Inhale 2 puffs into the lungs 2 (two) times daily.    Historical Provider, MD  buPROPion (WELLBUTRIN XL) 300 MG 24 hr tablet Take 300 mg by mouth daily.    Historical Provider, MD  carbamazepine (TEGRETOL XR) 200 MG 12 hr tablet Take 200 mg by mouth 2 (two) times daily. 04/24/16   Historical Provider, MD  gabapentin (NEURONTIN) 300 MG capsule Take 300 mg by mouth 2 (two) times daily. 03/25/16   Historical Provider, MD  glimepiride (AMARYL) 1 MG  tablet Take 1 tablet (1 mg total) by mouth daily with breakfast. Patient not taking: Reported on 05/05/2016 05/06/14   Thurnell Lose, MD  glimepiride (AMARYL) 4 MG tablet Take 4 mg by mouth daily. 04/03/16   Historical Provider, MD  lamoTRIgine (LAMICTAL) 150 MG tablet Take 150 mg by mouth daily. Take with Lamictal 200 mg to equal 450 mg 04/24/16   Historical Provider, MD  lamoTRIgine (LAMICTAL) 200 MG tablet Take 300 mg by mouth daily. Take with lamictal 150 mg to equal 450 mg 04/21/16   Historical Provider, MD  levothyroxine (SYNTHROID, LEVOTHROID) 50 MCG tablet Take 50 mcg by  mouth daily. 03/25/16   Historical Provider, MD  metroNIDAZOLE (METROGEL) 1 % gel Apply topically 2 (two) times daily. Inside ears and forehead 06/24/15 06/23/16  Historical Provider, MD  mirabegron ER (MYRBETRIQ) 50 MG TB24 tablet Take 50 mg by mouth daily.    Historical Provider, MD  montelukast (SINGULAIR) 5 MG chewable tablet Chew 5 mg by mouth daily. Take 5mg  per patient    Historical Provider, MD  Multiple Vitamin (MULTIVITAMIN WITH MINERALS) TABS tablet Take 1 tablet by mouth 2 (two) times daily.    Historical Provider, MD  Omega-3 Fatty Acids (FISH OIL) 1200 MG CAPS Take 1 capsule by mouth every evening.    Historical Provider, MD  ranitidine (ZANTAC) 300 MG tablet Take 300 mg by mouth daily. 04/24/16   Historical Provider, MD  sitaGLIPtin (JANUVIA) 25 MG tablet Take 1 tablet (25 mg total) by mouth daily. Patient not taking: Reported on 05/05/2016 05/10/14   Thurnell Lose, MD  sitaGLIPtin (JANUVIA) 50 MG tablet Take 25 mg by mouth daily. 03/25/16   Historical Provider, MD  tazarotene (AVAGE) 0.1 % cream Apply topically at bedtime.    Historical Provider, MD    ALLERGIES:  Allergies  Allergen Reactions  . Amoxicillin Diarrhea    SOCIAL HISTORY:  Social History  Substance Use Topics  . Smoking status: Never Smoker  . Smokeless tobacco: Never Used  . Alcohol use Yes     Comment: social    FAMILY HISTORY: Family History  Problem Relation Age of Onset  . Heart attack Mother   . Heart attack Father     EXAM: BP 140/64   Pulse 71   Temp 98.4 F (36.9 C) (Oral)   Resp 16   Ht 5' (1.524 m)   Wt 187 lb (84.8 kg)   SpO2 92%   BMI 36.52 kg/m  CONSTITUTIONAL: Alert and oriented and responds appropriately to questions. Well-appearing; well-nourished HEAD: Normocephalic EYES: Conjunctivae clear, PERRL, EOMI ENT: normal nose; no rhinorrhea; moist mucous membranes NECK: Supple, no meningismus, no nuchal rigidity, no LAD  CARD: RRR; S1 and S2 appreciated; no murmurs, no clicks, no  rubs, no gallops RESP: Normal chest excursion without splinting or tachypnea; breath sounds clear and equal bilaterally; no wheezes, no rhonchi, no rales, no hypoxia or respiratory distress, speaking full sentences ABD/GI: Normal bowel sounds; non-distended; soft, non-tender, no rebound, no guarding, no peritoneal signs, no hepatosplenomegaly BACK:  The back appears normal and is non-tender to palpation, there is no CVA tenderness EXT: Normal ROM in all joints; non-tender to palpation; no edema; normal capillary refill; no cyanosis, no calf tenderness or swelling    SKIN: Normal color for age and race; warm; no rash NEURO: Moves all extremities equally, sensation to light touch intact diffusely, cranial nerves II through XII intact, normal speech PSYCH: The patient's mood and manner are appropriate. Grooming and personal  hygiene are appropriate.  MEDICAL DECISION MAKING: Patient here with blurry vision that occurred after taking Xanax and Benadryl. Patient reports she thought her blood sugar was dropping those in the 130s during this episode. She reports she is normally in the 120s. Blood sugar did drop to 69 here with is coming back up appropriately. She was initially hypertensive but this is also improved. She is currently neurologically intact. Workup has been unremarkable. I do not think this was a stroke or TIA. She had no vision loss, amaurosis fugax.  I think this could've been medication related. I recommended she continue her medications as prescribed and follow-up with her primary care physician. She does report she is also on Tegretol, Lamictal for her bipolar disorder. Recent increase in her Tegretol dose. Have advised her to avoid Benadryl and Xanax together in the future. Patient and husband are comfortable this plan. Discussed return precautions.  At this time, I do not feel there is any life-threatening condition present. I have reviewed and discussed all results (EKG, imaging, lab, urine  as appropriate) and exam findings with patient/family. I have reviewed nursing notes and appropriate previous records.  I feel the patient is safe to be discharged home without further emergent workup and can continue workup as an outpatient as needed. Discussed usual and customary return precautions. Patient/family verbalize understanding and are comfortable with this plan.  Outpatient follow-up has been provided. All questions have been answered.          Crozier, DO 05/22/16 [phone]

## 2016-05-22 NOTE — ED Notes (Signed)
Reviewed d/c instructions with pt, but unable to obtain signature from pt d/t malfunction of signature pad. Pt departed in NAD.

## 2016-05-22 NOTE — ED Triage Notes (Signed)
Pt from home endorsing "my sugar is dropping fast. I took my blood sugar 1 hour ago and it was 179 and it dropped to 134, then 10 minutes later it was 120 and that is not normal." Pt is not on insulin but is on oral DM meds. Pt also endorses bilateral dizziness, Neuro exam unremarkable. CBG 69 in triage, pt given 2 cups of orange juice, peanut butter and crackers. Pt hypertensive in triage.

## 2016-08-13 ENCOUNTER — Encounter: Payer: Self-pay | Admitting: Registered"

## 2016-08-13 ENCOUNTER — Encounter: Payer: Medicare Other | Attending: Family Medicine | Admitting: Registered"

## 2016-08-13 DIAGNOSIS — E119 Type 2 diabetes mellitus without complications: Secondary | ICD-10-CM | POA: Insufficient documentation

## 2016-08-13 DIAGNOSIS — Z713 Dietary counseling and surveillance: Secondary | ICD-10-CM | POA: Diagnosis present

## 2016-08-13 NOTE — Progress Notes (Signed)
Diabetes Self-Management Education  Visit Type: First/Initial  Appt. Start Time: 1100 Appt. End Time: 7782  08/13/2016  [name], identified by name and date of birth, is a 61 y.o. female with a diagnosis of Diabetes: Type 2.   ASSESSMENT Pt states bipolar medication causes contipation as well as protein and she will aim to eat <20g protein for a meal or snack to help prevent constipation. Pt also reports concerns about too much calcium due to stage 3 kidney disease.  Pt states she likes cold sweet, food such as ice cream and frozen grapes. Pt states she does not get much exercise because she has some balance issues as well as a weak ankle. Pt states she has chair exercises with stretch band which she plans to start doing and she does walk a little on even surfaces.     Diabetes Self-Management Education - 08/13/16 1116      Visit Information   Visit Type First/Initial     Initial Visit   Diabetes Type Type 2   Are you currently following a meal plan? No   Are you taking your medications as prescribed? Yes   Date Diagnosed 2-3 yrs ago     Health Coping   How would you rate your overall health? Good     Psychosocial Assessment   Patient Belief/Attitude about Diabetes Motivated to manage diabetes   How often do you need to have someone help you when you read instructions, pamphlets, or other written materials from your doctor or pharmacy? 1 - Never   What is the last grade level you completed in school? assiociates degree     Complications   Last HgB A1C per patient/outside source 6.1 %  per pt   How often do you check your blood sugar? 1-2 times/day   Fasting Blood glucose range (mg/dL) 70-129   Number of hypoglycemic episodes per month 1   Can you tell when your blood sugar is low? Yes   What do you do if your blood sugar is low? eat a snack, similar to what they gave her in ER when she had severe hypoglycemia   Have you had a dilated eye exam in the past 12  months? Yes   Have you had a dental exam in the past 12 months? Yes   Are you checking your feet? Yes   How many days per week are you checking your feet? 7     Dietary Intake   Breakfast protein bar OR atkins shake   Snack (morning) atkins protein bars/shake   Lunch sandwich OR hotdogs   Snack (afternoon) apples OR carrots OR peanuts   Dinner protein, vegetable, starch   Snack (evening) protein bar OR ice cream bar   Beverage(s) water, diet ginger ale, diet root beer occassionally     Exercise   Exercise Type ADL's   How many days per week to you exercise? 0   How many minutes per day do you exercise? 0   Total minutes per week of exercise 0     Patient Education   Previous Diabetes Education No   Disease state  Definition of diabetes, type 1 and 2, and the diagnosis of diabetes   Nutrition management  Role of diet in the treatment of diabetes and the relationship between the three main macronutrients and blood glucose level;Food label reading, portion sizes and measuring food.;Carbohydrate counting   Physical activity and exercise  Role of exercise on diabetes management, blood pressure control  and cardiac health.   Monitoring Identified appropriate SMBG and/or A1C goals.   Acute complications Taught treatment of hypoglycemia - the 15 rule.     Individualized Goals (developed by patient)   Nutrition General guidelines for healthy choices and portions discussed     Outcomes   Expected Outcomes Demonstrated interest in learning. Expect positive outcomes   Future DMSE PRN   Program Status Completed    Individualized Plan for Diabetes Self-Management Training:   Learning Objective:  Patient will have a greater understanding of diabetes self-management. Patient education plan is to attend individual and/or group sessions per assessed needs and concerns.   Patient Instructions  Plan:  Aim for 2-3 Carb Choices per meal (30-45 grams) +/- 1 either way  Aim for 0-15 Carbs per  snack if hungry  Include protein with your meals and snacks Consider reading food labels for Total Carbohydrate  Consider daily activity as tolerated, aim to make a plan to start an exercise routine. Continue checking BG at alternate times per day as directed by MD  Continue taking medication as directed by MD Consider drinking more water, less soda Consider experimenting with the low sodium seasoning ideas (handout)  Expected Outcomes:  Demonstrated interest in learning. Expect positive outcomes  Education material provided: Living Well with Diabetes, My Plate, Snack sheet, Carbohydrate counting sheet and No sodium seasonings  If problems or questions, patient to contact team via:  Phone and MyChart  Future DSME appointment: PRN

## 2016-08-13 NOTE — Patient Instructions (Addendum)
Plan:  Aim for 2-3 Carb Choices per meal (30-45 grams) +/- 1 either way  Aim for 0-15 Carbs per snack if hungry  Include protein with your meals and snacks Consider reading food labels for Total Carbohydrate  Consider daily activity as tolerated, aim to make a plan to start an exercise routine. Continue checking BG at alternate times per day as directed by MD  Continue taking medication as directed by MD Consider drinking more water, less soda Consider experimenting with the low sodium seasoning ideas (handout)

## 2017-02-27 ENCOUNTER — Encounter (HOSPITAL_COMMUNITY): Payer: Self-pay | Admitting: Emergency Medicine

## 2017-02-27 ENCOUNTER — Emergency Department (HOSPITAL_COMMUNITY): Payer: Medicare Other

## 2017-02-27 ENCOUNTER — Emergency Department (HOSPITAL_COMMUNITY)
Admission: EM | Admit: 2017-02-27 | Discharge: 2017-02-28 | Disposition: A | Discharge status: Home / Self Care | Payer: Medicare Other | Attending: Emergency Medicine | Admitting: Emergency Medicine

## 2017-02-27 DIAGNOSIS — J45909 Unspecified asthma, uncomplicated: Secondary | ICD-10-CM | POA: Diagnosis not present

## 2017-02-27 DIAGNOSIS — I129 Hypertensive chronic kidney disease with stage 1 through stage 4 chronic kidney disease, or unspecified chronic kidney disease: Secondary | ICD-10-CM | POA: Insufficient documentation

## 2017-02-27 DIAGNOSIS — Z79899 Other long term (current) drug therapy: Secondary | ICD-10-CM | POA: Diagnosis not present

## 2017-02-27 DIAGNOSIS — E1143 Type 2 diabetes mellitus with diabetic autonomic (poly)neuropathy: Secondary | ICD-10-CM | POA: Diagnosis not present

## 2017-02-27 DIAGNOSIS — N183 Chronic kidney disease, stage 3 (moderate): Secondary | ICD-10-CM | POA: Diagnosis not present

## 2017-02-27 DIAGNOSIS — E1122 Type 2 diabetes mellitus with diabetic chronic kidney disease: Secondary | ICD-10-CM | POA: Insufficient documentation

## 2017-02-27 DIAGNOSIS — Z7984 Long term (current) use of oral hypoglycemic drugs: Secondary | ICD-10-CM | POA: Insufficient documentation

## 2017-02-27 DIAGNOSIS — H532 Diplopia: Secondary | ICD-10-CM | POA: Insufficient documentation

## 2017-02-27 LAB — CBC
HCT: 43.7 % (ref 36.0–46.0)
HEMOGLOBIN: 14.4 g/dL (ref 12.0–15.0)
MCH: 32.4 pg (ref 26.0–34.0)
MCHC: 33 g/dL (ref 30.0–36.0)
MCV: 98.4 fL (ref 78.0–100.0)
Platelets: 187 10*3/uL (ref 150–400)
RBC: 4.44 MIL/uL (ref 3.87–5.11)
RDW: 13 % (ref 11.5–15.5)
WBC: 8.4 10*3/uL (ref 4.0–10.5)

## 2017-02-27 LAB — BASIC METABOLIC PANEL
ANION GAP: 9 (ref 5–15)
BUN: 38 mg/dL — ABNORMAL HIGH (ref 6–20)
CHLORIDE: 102 mmol/L (ref 101–111)
CO2: 29 mmol/L (ref 22–32)
Calcium: 9.5 mg/dL (ref 8.9–10.3)
Creatinine, Ser: 2.3 mg/dL — ABNORMAL HIGH (ref 0.44–1.00)
GFR calc non Af Amer: 22 mL/min — ABNORMAL LOW (ref >60)
GFR, EST AFRICAN AMERICAN: 25 mL/min — AB (ref >60)
Glucose, Bld: 108 mg/dL — ABNORMAL HIGH (ref 65–99)
Potassium: 4.3 mmol/L (ref 3.5–5.1)
Sodium: 140 mmol/L (ref 135–145)

## 2017-02-27 LAB — CBG MONITORING, ED: GLUCOSE-CAPILLARY: 99 mg/dL (ref 65–99)

## 2017-02-27 NOTE — ED Provider Notes (Signed)
[facility] EMERGENCY DEPARTMENT Provider Note   CSN: [id number] Arrival date & time: 02/27/17  2215     History   Chief Complaint Chief Complaint  Patient presents with  . Diplopia    HPI Denise Serrano is a 61 y.o. female.  The history is provided by the patient. No language interpreter was used.  Eye Problem   This is a new problem. The current episode started 3 to 5 hours ago. The problem occurs constantly. The problem has not changed since onset.There is a problem in both eyes. There was no injury mechanism. The patient is experiencing no pain. There is no known exposure to pink eye. She does not wear contacts. Associated symptoms include double vision and itching. Pertinent negatives include no numbness, no blurred vision, no decreased vision, no photophobia, no eye redness, no nausea, no vomiting, no tingling and no weakness. She has tried eye drops (and OJ ) for the symptoms. The treatment provided no relief.    Past Medical History:  Diagnosis Date  . Arthritis of knee, left   . Asthma   . Bipolar 1 disorder, mixed ([place])   . Cataract immature BILATERAL  . Chronic kidney disease (CKD), stage III (moderate) ([place]) MONITORED BY DR GOLDSBOROUGH  . Diabetes mellitus without complication ([place])   . Elevated blood pressure (not hypertension) borderline-  monitored  . Impaired hearing hearing aid- bilateral  . Nocturia   . OSA on CPAP   . Seasonal allergies   . Stress incontinence, female   . Urgency of urination     Patient Active Problem List   Diagnosis Date Noted  . Anemia, iron deficiency 02/05/2016  . Metabolic acidosis 14/43/1540  . Anemia of renal disease 07/01/2014  . Acute renal failure ([place]) 06/28/2014  . Asthma exacerbation 06/28/2014  . Dizziness 06/28/2014  . GERD (gastroesophageal reflux disease) 06/28/2014  . AKI (acute kidney injury) ([place]) 05/03/2014  . Bipolar disorder ([place]) 05/03/2014  . Essential hypertension 05/03/2014  .  Diarrhea of presumed infectious origin 05/03/2014  . Hypothyroidism 05/03/2014  . Hyperlipidemia 05/03/2014  . Gait difficulty 11/15/2013  . Type 2 diabetes mellitus with diabetic chronic kidney disease ([place]) 11/14/2013  . Diabetic polyneuropathy associated with type 2 diabetes mellitus ([place]) 11/14/2013  . Ingrown left big toenail 10/22/2010    Past Surgical History:  Procedure Laterality Date  . BLADDER SUSPENSION  05/07/2011   Procedure: [facility] PROCEDURE;  Surgeon: Reece Packer, MD;  Location: [facility];  Service: Urology;  Laterality: N/A;  cysto, sparc sling   . DX LAPAROSCOPY FOR INFERTILITY  27 YRS AGO  . TUBAL LIGATION  20 YRS AGO    OB History    No data available       Home Medications    Prior to Admission medications   Medication Sig Start Date End Date Taking? Authorizing Provider  albuterol (PROVENTIL) (2.5 MG/3ML) 0.083% nebulizer solution Take 3 mLs (2.5 mg total) by nebulization every 4 (four) hours as needed for wheezing or shortness of breath. 07/01/14   Janece Canterbury, MD  ALPRAZolam Duanne Moron) 0.5 MG tablet Take 0.5 mg by mouth 3 (three) times daily as needed for anxiety (anxiety). For anxiety.    [provider]  amLODipine (NORVASC) 2.5 MG tablet Take 2.5 mg by mouth daily. 04/27/16   [provider]  aspirin EC 81 MG tablet Take 81 mg by mouth daily.    [provider]  atorvastatin (LIPITOR) 20 MG tablet Take 20 mg  by mouth daily. 03/25/16   [provider]  budesonide-formoterol (SYMBICORT) 160-4.5 MCG/ACT inhaler Inhale 2 puffs into the lungs 2 (two) times daily.    [provider]  buPROPion (WELLBUTRIN XL) 300 MG 24 hr tablet Take 300 mg by mouth daily.    [provider]  carbamazepine (TEGRETOL XR) 200 MG 12 hr tablet Take 200 mg by mouth 2 (two) times daily. 04/24/16   [provider]  gabapentin (NEURONTIN) 300 MG capsule Take 300 mg by mouth 2 (two) times daily. 03/25/16    [provider]  glimepiride (AMARYL) 1 MG tablet Take 1 tablet (1 mg total) by mouth daily with breakfast. Patient not taking: Reported on 05/05/2016 05/06/14   Thurnell Lose, MD  glimepiride (AMARYL) 4 MG tablet Take 4 mg by mouth daily. 04/03/16   [provider]  lamoTRIgine (LAMICTAL) 150 MG tablet Take 150 mg by mouth daily. Take with Lamictal 200 mg to equal 450 mg 04/24/16   [provider]  lamoTRIgine (LAMICTAL) 200 MG tablet Take 300 mg by mouth daily. Take with lamictal 150 mg to equal 450 mg 04/21/16   [provider]  levothyroxine (SYNTHROID, LEVOTHROID) 50 MCG tablet Take 50 mcg by mouth daily. 03/25/16   [provider]  mirabegron ER (MYRBETRIQ) 50 MG TB24 tablet Take 50 mg by mouth daily.    [provider]  montelukast (SINGULAIR) 5 MG chewable tablet Chew 5 mg by mouth daily. Take 5mg  per patient    [provider]  Multiple Vitamin (MULTIVITAMIN WITH MINERALS) TABS tablet Take 1 tablet by mouth 2 (two) times daily.    [provider]  Omega-3 Fatty Acids (FISH OIL) 1200 MG CAPS Take 1 capsule by mouth every evening.    [provider]  ranitidine (ZANTAC) 300 MG tablet Take 300 mg by mouth daily. 04/24/16   [provider]  sitaGLIPtin (JANUVIA) 25 MG tablet Take 1 tablet (25 mg total) by mouth daily. 05/10/14   Thurnell Lose, MD  sitaGLIPtin (JANUVIA) 50 MG tablet Take 25 mg by mouth daily. 03/25/16   [provider]  tazarotene (AVAGE) 0.1 % cream Apply topically at bedtime.    [provider]    Family History Family History  Problem Relation Age of Onset  . Heart attack Mother   . Heart attack Father     Social History Social History   Tobacco Use  . Smoking status: Never Smoker  . Smokeless tobacco: Never Used  Substance Use Topics  . Alcohol use: Yes    Comment: social  . Drug use: No     Allergies   Amoxicillin   Review of Systems Review of  Systems  HENT: Negative for facial swelling.   Eyes: Positive for double vision. Negative for blurred vision, photophobia and redness.  Respiratory: Negative for shortness of breath.   Cardiovascular: Negative for chest pain.  Gastrointestinal: Negative for abdominal pain, nausea and vomiting.  Skin: Positive for itching.  Neurological: Negative for dizziness, tingling, tremors, seizures, syncope, facial asymmetry, speech difficulty, weakness, light-headedness, numbness and headaches.  All other systems reviewed and are negative.    Physical Exam Updated Vital Signs BP (!) 233/89 (BP Location: Right Arm)   Pulse 87   Temp 98.1 F (36.7 C) (Oral)   Resp 18   Ht 5\' 5"  (1.651 m)   Wt 86.2 kg (190 lb)   SpO2 96%   BMI 31.62 kg/m   Physical Exam  Constitutional: She  is oriented to person, place, and time. She appears well-developed and well-nourished. No distress.  HENT:  Head: Normocephalic and atraumatic.  Right Ear: External ear normal.  Left Ear: External ear normal.  Nose: Nose normal.  Mouth/Throat: Oropharynx is clear and moist. No oropharyngeal exudate.  Eyes: Conjunctivae and EOM are normal. Pupils are equal, round, and reactive to light. Right eye exhibits no discharge. Left eye exhibits no discharge.  Neck: Normal range of motion. Neck supple.  Cardiovascular: Normal rate, regular rhythm, normal heart sounds and intact distal pulses.  Pulmonary/Chest: Effort normal and breath sounds normal. No stridor. She has no wheezes. She has no rales.  Abdominal: Soft. Bowel sounds are normal. She exhibits no mass. There is no tenderness. There is no rebound and no guarding.  Musculoskeletal: Normal range of motion.  Neurological: She is alert and oriented to person, place, and time. She displays normal reflexes. No cranial nerve deficit. Coordination normal.  Skin: Skin is warm and dry. Capillary refill takes less than 2 seconds.  Psychiatric: She has a normal mood and affect.      ED Treatments / Results  Labs (all labs ordered are listed, but only abnormal results are displayed) Results for orders placed or performed during the hospital encounter of 19/14/78  Basic metabolic panel  Result Value Ref Range   Sodium 140 135 - 145 mmol/L   Potassium 4.3 3.5 - 5.1 mmol/L   Chloride 102 101 - 111 mmol/L   CO2 29 22 - 32 mmol/L   Glucose, Bld 108 (H) 65 - 99 mg/dL   BUN 38 (H) 6 - 20 mg/dL   Creatinine, Ser 2.30 (H) 0.44 - 1.00 mg/dL   Calcium 9.5 8.9 - 10.3 mg/dL   GFR calc non Af Amer 22 (L) >60 mL/min   GFR calc Af Amer 25 (L) >60 mL/min   Anion gap 9 5 - 15  CBC  Result Value Ref Range   WBC 8.4 4.0 - 10.5 K/uL   RBC 4.44 3.87 - 5.11 MIL/uL   Hemoglobin 14.4 12.0 - 15.0 g/dL   HCT 43.7 36.0 - 46.0 %   MCV 98.4 78.0 - 100.0 fL   MCH 32.4 26.0 - 34.0 pg   MCHC 33.0 30.0 - 36.0 g/dL   RDW 13.0 11.5 - 15.5 %   Platelets 187 150 - 400 K/uL  Urinalysis, Routine w reflex microscopic  Result Value Ref Range   Color, Urine STRAW (A) YELLOW   APPearance CLEAR CLEAR   Specific Gravity, Urine 1.009 1.005 - 1.030   pH 7.0 5.0 - 8.0   Glucose, UA NEGATIVE NEGATIVE mg/dL   Hgb urine dipstick NEGATIVE NEGATIVE   Bilirubin Urine NEGATIVE NEGATIVE   Ketones, ur NEGATIVE NEGATIVE mg/dL   Protein, ur 100 (A) NEGATIVE mg/dL   Nitrite NEGATIVE NEGATIVE   Leukocytes, UA SMALL (A) NEGATIVE   RBC / HPF 0-5 0 - 5 RBC/hpf   WBC, UA 0-5 0 - 5 WBC/hpf   Bacteria, UA RARE (A) NONE SEEN   Squamous Epithelial / LPF NONE SEEN NONE SEEN  Rapid urine drug screen (hospital performed)  Result Value Ref Range   Opiates NONE DETECTED NONE DETECTED   Cocaine NONE DETECTED NONE DETECTED   Benzodiazepines POSITIVE (A) NONE DETECTED   Amphetamines NONE DETECTED NONE DETECTED   Tetrahydrocannabinol NONE DETECTED NONE DETECTED   Barbiturates NONE DETECTED NONE DETECTED  CBG monitoring, ED  Result Value Ref Range   Glucose-Capillary 99 65 - 99 mg/dL  Ct Head Wo  Contrast  Result Date: 02/27/2017 CLINICAL DATA:  Blurry vision since this morning. History of stage 3 kidney disease. EXAM: CT HEAD WITHOUT CONTRAST TECHNIQUE: Contiguous axial images were obtained from the base of the skull through the vertex without intravenous contrast. COMPARISON:  MRI of the head January 18, 2014 FINDINGS: BRAIN: No intraparenchymal hemorrhage, mass effect nor midline shift. The ventricles and sulci are normal for age. Minimal supratentorial white matter hypodensities less than expected for patient's age, though non-specific are most compatible with chronic small vessel ischemic disease. No acute large vascular territory infarcts. No abnormal extra-axial fluid collections. Basal cisterns are patent. VASCULAR: Mild calcific atherosclerosis of the carotid siphons. SKULL: No skull fracture. No significant scalp soft tissue swelling. SINUSES/ORBITS: Mild paranasal sinus mucosal thickening without air-fluid levels. Trace bilateral mastoid effusions. The included ocular globes and orbital contents are non-suspicious. Status post bilateral ocular lens implants. OTHER: None. IMPRESSION: Normal noncontrast CT HEAD for age. Electronically Signed   By: Elon Alas M.D.   On: 02/27/2017 23:45   Mr Angiogram Head Wo Contrast  Result Date: 02/28/2017 CLINICAL DATA:  61 y/o  F; blurry vision. EXAM: MRI HEAD WITHOUT CONTRAST MRA HEAD WITHOUT CONTRAST TECHNIQUE: Multiplanar, multiecho pulse sequences of the brain and surrounding structures were obtained without intravenous contrast. Angiographic images of the head were obtained using MRA technique without contrast. COMPARISON:  02/27/2017 CT of the head.  01/18/2014 MRI of the head. FINDINGS: MRI HEAD FINDINGS Brain: No acute infarction, hemorrhage, hydrocephalus, extra-axial collection or mass lesion. Vascular: Normal flow voids. Skull and upper cervical spine: Normal marrow signal. Sinuses/Orbits: Mild diffuse paranasal sinus mucosal thickening.  Bilateral intra-ocular lens replacement. Trace left mastoid effusion. Other: None. MRA HEAD FINDINGS Internal carotid arteries:  Patent. Anterior cerebral arteries:  Patent. Middle cerebral arteries: Patent. Anterior communicating artery: Not identified, likely hypoplastic or absent. Posterior communicating arteries: Patent left. No right identified, likely hypoplastic or absent. Posterior cerebral arteries:  Patent. Basilar artery:  Patent. Vertebral arteries:  Patent. No evidence of high-grade stenosis, large vessel occlusion, or aneurysm. IMPRESSION: 1. No acute intracranial abnormality identified. Unremarkable MRI of the brain for age. 2. Normal MRA of the head. Electronically Signed   By: Kristine Garbe M.D.   On: 02/28/2017 02:35   Mr Brain Wo Contrast  Result Date: 02/28/2017 CLINICAL DATA:  61 y/o  F; blurry vision. EXAM: MRI HEAD WITHOUT CONTRAST MRA HEAD WITHOUT CONTRAST TECHNIQUE: Multiplanar, multiecho pulse sequences of the brain and surrounding structures were obtained without intravenous contrast. Angiographic images of the head were obtained using MRA technique without contrast. COMPARISON:  02/27/2017 CT of the head.  01/18/2014 MRI of the head. FINDINGS: MRI HEAD FINDINGS Brain: No acute infarction, hemorrhage, hydrocephalus, extra-axial collection or mass lesion. Vascular: Normal flow voids. Skull and upper cervical spine: Normal marrow signal. Sinuses/Orbits: Mild diffuse paranasal sinus mucosal thickening. Bilateral intra-ocular lens replacement. Trace left mastoid effusion. Other: None. MRA HEAD FINDINGS Internal carotid arteries:  Patent. Anterior cerebral arteries:  Patent. Middle cerebral arteries: Patent. Anterior communicating artery: Not identified, likely hypoplastic or absent. Posterior communicating arteries: Patent left. No right identified, likely hypoplastic or absent. Posterior cerebral arteries:  Patent. Basilar artery:  Patent. Vertebral arteries:  Patent. No  evidence of high-grade stenosis, large vessel occlusion, or aneurysm. IMPRESSION: 1. No acute intracranial abnormality identified. Unremarkable MRI of the brain for age. 2. Normal MRA of the head. Electronically Signed   By: Kristine Garbe M.D.   On: 02/28/2017 02:35  Visual Acuity  Right Eye Distance: 10/20 Left Eye Distance: 10/16 Bilateral Distance: 10/16  Right Eye Near:   Left Eye Near:    Bilateral Near:     Procedures Procedures (including critical care time)  Case d/w Dr. Cheral Marker if MRi is negative no indication for admission, outpatient f/u with ophtho.    Symptoms resolved in the ED.    Final Clinical Impressions(s) / ED Diagnoses   Strict return precautions for facial swelling change in thinking or speech, fevers, stiff neck, intractable vomiting, or diarrhea, abdominal pain, Inability to tolerate liquids or food, cough, altered mental status or any concerns. No signs of systemic illness or infection. The patient is nontoxic-appearing on exam and vital signs are within normal limits.    I have reviewed the triage vital signs and the nursing notes. Pertinent labs &imaging results that were available during my care of the patient were reviewed by me and considered in my medical decision making (see chart for details).  After history, exam, and medical workup I feel the patient has been appropriately medically screened and is safe for discharge home. Pertinent diagnoses were discussed with the patient. Patient was given return precautions     Jocsan Mcginley, MD 02/28/17 [phone]

## 2017-02-27 NOTE — ED Triage Notes (Signed)
Pt states "I started seeing double 40 min ago, my sugar was in the 140's, I ate crackers, drank OJ but I'm still seeing double."  "I have diabetes, chronic kidney disease and bipolar so I want to get checked out" Pt is very tearful in triage

## 2017-02-27 NOTE — ED Notes (Signed)
Patient transported to CT 

## 2017-02-28 ENCOUNTER — Emergency Department (HOSPITAL_COMMUNITY): Payer: Medicare Other

## 2017-02-28 LAB — URINALYSIS, ROUTINE W REFLEX MICROSCOPIC
Bilirubin Urine: NEGATIVE
GLUCOSE, UA: NEGATIVE mg/dL
Hgb urine dipstick: NEGATIVE
Ketones, ur: NEGATIVE mg/dL
Nitrite: NEGATIVE
PROTEIN: 100 mg/dL — AB
Specific Gravity, Urine: 1.009 (ref 1.005–1.030)
Squamous Epithelial / LPF: NONE SEEN
pH: 7 (ref 5.0–8.0)

## 2017-02-28 LAB — RAPID URINE DRUG SCREEN, HOSP PERFORMED
AMPHETAMINES: NOT DETECTED
Barbiturates: NOT DETECTED
Benzodiazepines: POSITIVE — AB
Cocaine: NOT DETECTED
Opiates: NOT DETECTED
TETRAHYDROCANNABINOL: NOT DETECTED

## 2017-02-28 NOTE — ED Notes (Signed)
Patient transported to MRI 

## 2017-10-22 ENCOUNTER — Emergency Department (HOSPITAL_COMMUNITY)
Admission: EM | Admit: 2017-10-22 | Discharge: 2017-10-22 | Disposition: A | Discharge status: Home / Self Care | Payer: No Typology Code available for payment source | Attending: Emergency Medicine | Admitting: Emergency Medicine

## 2017-10-22 ENCOUNTER — Emergency Department (HOSPITAL_COMMUNITY): Payer: No Typology Code available for payment source

## 2017-10-22 ENCOUNTER — Encounter (HOSPITAL_COMMUNITY): Payer: Self-pay | Admitting: Emergency Medicine

## 2017-10-22 DIAGNOSIS — S20219A Contusion of unspecified front wall of thorax, initial encounter: Secondary | ICD-10-CM

## 2017-10-22 DIAGNOSIS — Y939 Activity, unspecified: Secondary | ICD-10-CM | POA: Insufficient documentation

## 2017-10-22 DIAGNOSIS — E119 Type 2 diabetes mellitus without complications: Secondary | ICD-10-CM | POA: Insufficient documentation

## 2017-10-22 DIAGNOSIS — Y929 Unspecified place or not applicable: Secondary | ICD-10-CM | POA: Diagnosis not present

## 2017-10-22 DIAGNOSIS — S40022A Contusion of left upper arm, initial encounter: Secondary | ICD-10-CM | POA: Diagnosis not present

## 2017-10-22 DIAGNOSIS — N183 Chronic kidney disease, stage 3 (moderate): Secondary | ICD-10-CM | POA: Diagnosis not present

## 2017-10-22 DIAGNOSIS — J45909 Unspecified asthma, uncomplicated: Secondary | ICD-10-CM | POA: Insufficient documentation

## 2017-10-22 DIAGNOSIS — Y999 Unspecified external cause status: Secondary | ICD-10-CM | POA: Insufficient documentation

## 2017-10-22 DIAGNOSIS — I129 Hypertensive chronic kidney disease with stage 1 through stage 4 chronic kidney disease, or unspecified chronic kidney disease: Secondary | ICD-10-CM | POA: Diagnosis not present

## 2017-10-22 DIAGNOSIS — S59912A Unspecified injury of left forearm, initial encounter: Secondary | ICD-10-CM | POA: Diagnosis present

## 2017-10-22 DIAGNOSIS — Z79899 Other long term (current) drug therapy: Secondary | ICD-10-CM | POA: Insufficient documentation

## 2017-10-22 LAB — CBC WITH DIFFERENTIAL/PLATELET
ABS IMMATURE GRANULOCYTES: 0.1 10*3/uL (ref 0.0–0.1)
Basophils Absolute: 0.1 10*3/uL (ref 0.0–0.1)
Basophils Relative: 1 %
Eosinophils Absolute: 1.7 10*3/uL — ABNORMAL HIGH (ref 0.0–0.7)
Eosinophils Relative: 18 %
HCT: 46.4 % — ABNORMAL HIGH (ref 36.0–46.0)
HEMOGLOBIN: 14.5 g/dL (ref 12.0–15.0)
Immature Granulocytes: 1 %
Lymphocytes Relative: 25 %
Lymphs Abs: 2.4 10*3/uL (ref 0.7–4.0)
MCH: 30.1 pg (ref 26.0–34.0)
MCHC: 31.3 g/dL (ref 30.0–36.0)
MCV: 96.3 fL (ref 78.0–100.0)
MONO ABS: 0.5 10*3/uL (ref 0.1–1.0)
MONOS PCT: 5 %
NEUTROS ABS: 4.8 10*3/uL (ref 1.7–7.7)
Neutrophils Relative %: 50 %
Platelets: 165 10*3/uL (ref 150–400)
RBC: 4.82 MIL/uL (ref 3.87–5.11)
RDW: 13.2 % (ref 11.5–15.5)
WBC: 9.4 10*3/uL (ref 4.0–10.5)

## 2017-10-22 LAB — COMPREHENSIVE METABOLIC PANEL
ALK PHOS: 138 U/L — AB (ref 38–126)
ALT: 50 U/L — AB (ref 0–44)
AST: 26 U/L (ref 15–41)
Albumin: 3.8 g/dL (ref 3.5–5.0)
Anion gap: 16 — ABNORMAL HIGH (ref 5–15)
BUN: 33 mg/dL — ABNORMAL HIGH (ref 8–23)
CO2: 22 mmol/L (ref 22–32)
Calcium: 9.5 mg/dL (ref 8.9–10.3)
Chloride: 103 mmol/L (ref 98–111)
Creatinine, Ser: 2.01 mg/dL — ABNORMAL HIGH (ref 0.44–1.00)
GFR calc non Af Amer: 25 mL/min — ABNORMAL LOW (ref >60)
GFR, EST AFRICAN AMERICAN: 29 mL/min — AB (ref >60)
Glucose, Bld: 218 mg/dL — ABNORMAL HIGH (ref 70–99)
Potassium: 4.3 mmol/L (ref 3.5–5.1)
SODIUM: 141 mmol/L (ref 135–145)
TOTAL PROTEIN: 7.4 g/dL (ref 6.5–8.1)
Total Bilirubin: 0.5 mg/dL (ref 0.3–1.2)

## 2017-10-22 MED ORDER — ACETAMINOPHEN 500 MG PO TABS
1000.0000 mg | ORAL_TABLET | Freq: Once | ORAL | Status: AC
  Administered 2017-10-22: 1000 mg via ORAL
  Filled 2017-10-22: qty 2

## 2017-10-22 NOTE — Discharge Instructions (Addendum)
Tylenol 1000 mg every 6 hours as needed for pain.  Apply ice to affected areas for 20 minutes every 2 hours while awake for the next 2 days.  Follow-up with your primary doctor if symptoms are not improving in the next week.
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
## 2018-12-05 ENCOUNTER — Other Ambulatory Visit: Payer: Self-pay | Admitting: Radiology

## 2019-04-05 ENCOUNTER — Emergency Department (HOSPITAL_COMMUNITY)
Admission: EM | Admit: 2019-04-05 | Discharge: 2019-04-06 | Disposition: A | Discharge status: Home / Self Care | Payer: Medicare Other | Attending: Emergency Medicine | Admitting: Emergency Medicine

## 2019-04-05 ENCOUNTER — Encounter (HOSPITAL_COMMUNITY): Payer: Self-pay

## 2019-04-05 ENCOUNTER — Other Ambulatory Visit: Payer: Self-pay

## 2019-04-05 ENCOUNTER — Emergency Department (HOSPITAL_COMMUNITY): Payer: Medicare Other

## 2019-04-05 DIAGNOSIS — R441 Visual hallucinations: Secondary | ICD-10-CM | POA: Insufficient documentation

## 2019-04-05 DIAGNOSIS — R5383 Other fatigue: Secondary | ICD-10-CM | POA: Diagnosis not present

## 2019-04-05 DIAGNOSIS — F319 Bipolar disorder, unspecified: Secondary | ICD-10-CM | POA: Diagnosis not present

## 2019-04-05 DIAGNOSIS — Z79899 Other long term (current) drug therapy: Secondary | ICD-10-CM | POA: Diagnosis not present

## 2019-04-05 DIAGNOSIS — E1122 Type 2 diabetes mellitus with diabetic chronic kidney disease: Secondary | ICD-10-CM | POA: Diagnosis not present

## 2019-04-05 DIAGNOSIS — N183 Chronic kidney disease, stage 3 unspecified: Secondary | ICD-10-CM | POA: Insufficient documentation

## 2019-04-05 NOTE — ED Provider Notes (Signed)
TIME SEEN: 11:25 PM  CHIEF COMPLAINT: Visual hallucinations  HPI: Patient is a 64 year old female with history of bipolar, chronic kidney disease, hypertension, diabetes who presents to the emergency department with complaints of visual hallucinations today.  States that she thought she was in an [employer] in [place] with little overall was dressed and pink dresses that were all asleep today.  States she also had another hallucination earlier in the day that she states her husband had to tell her was not real.  She cannot recall this hallucination.  No auditory hallucinations.  She denies headache, head injury, fever, chest pain, shortness of breath, cough, vomiting, diarrhea, numbness or weakness.  No changes in her medications.  No drug or alcohol use.  Has had similar symptoms once before and states it was from acute kidney injury.  States she was started on trazodone several weeks ago but has not had this medication today.  No other new medicines.  Per nurse, husband states patient may have taken a second dose of her psychiatric medications yesterday.   ROS: See HPI Constitutional: no fever  Eyes: no drainage  ENT: no runny nose   Cardiovascular:  no chest pain  Resp: no SOB  GI: no vomiting GU: no dysuria Integumentary: no rash  Allergy: no hives  Musculoskeletal: no leg swelling  Neurological: no slurred speech ROS otherwise negative  PAST MEDICAL HISTORY/PAST SURGICAL HISTORY:  Past Medical History:  Diagnosis Date  . Arthritis of knee, left   . Asthma   . Bipolar 1 disorder, mixed ([place])   . Cataract immature BILATERAL  . Chronic kidney disease (CKD), stage III (moderate) MONITORED BY DR GOLDSBOROUGH  . Diabetes mellitus without complication ([place])   . Elevated blood pressure (not hypertension) borderline-  monitored  . Impaired hearing hearing aid- bilateral  . Nocturia   . OSA on CPAP   . Seasonal allergies   . Stress incontinence, female   . Urgency of urination      MEDICATIONS:  Prior to Admission medications   Medication Sig Start Date End Date Taking? Authorizing Provider  adapalene (DIFFERIN) 0.1 % gel Apply 1 application topically at bedtime.    [provider]  albuterol (PROVENTIL) (2.5 MG/3ML) 0.083% nebulizer solution Take 3 mLs (2.5 mg total) by nebulization every 4 (four) hours as needed for wheezing or shortness of breath. 07/01/14   Janece Canterbury, MD  ALPRAZolam Duanne Moron) 0.5 MG tablet Take 0.5 mg by mouth 3 (three) times daily as needed for anxiety (anxiety). For anxiety.    [provider]  amLODipine (NORVASC) 2.5 MG tablet Take 5 mg by mouth 2 (two) times daily.  04/27/16   [provider]  aspirin EC 81 MG tablet Take 81 mg by mouth daily.    [provider]  atorvastatin (LIPITOR) 20 MG tablet Take 20 mg by mouth daily. 03/25/16   [provider]  buPROPion (WELLBUTRIN XL) 300 MG 24 hr tablet Take 300 mg by mouth daily.    [provider]  carbamazepine (TEGRETOL XR) 200 MG 12 hr tablet Take 200 mg by mouth 2 (two) times daily. 04/24/16   [provider]  gabapentin (NEURONTIN) 300 MG capsule Take 300 mg by mouth 2 (two) times daily. 03/25/16   [provider]  lamoTRIgine (LAMICTAL) 150 MG tablet Take 400 mg by mouth at bedtime. Take with Lamictal 200 mg to equal 450 mg 04/24/16   [provider]  levothyroxine (SYNTHROID, LEVOTHROID) 50 MCG tablet Take 50 mcg  by mouth daily. 03/25/16   [provider]  metroNIDAZOLE (METROGEL) 1 % gel Apply 1 application topically 2 (two) times daily.    [provider]  mirabegron ER (MYRBETRIQ) 50 MG TB24 tablet Take 50 mg by mouth daily.    [provider]  montelukast (SINGULAIR) 5 MG chewable tablet Chew 5 mg by mouth daily. Take 5mg  per patient    [provider]  Multiple Vitamin (MULTIVITAMIN WITH MINERALS) TABS tablet Take 1 tablet by mouth 2 (two) times daily.    [provider]   ranitidine (ZANTAC) 300 MG tablet Take 300 mg by mouth daily. 04/24/16   [provider]  sitaGLIPtin (JANUVIA) 25 MG tablet Take 1 tablet (25 mg total) by mouth daily. 05/10/14   Thurnell Lose, MD    ALLERGIES:  Allergies  Allergen Reactions  . Iodine Other (See Comments)  . Amoxicillin Diarrhea  . Penicillin G Diarrhea    SOCIAL HISTORY:  Social History   Tobacco Use  . Smoking status: Never Smoker  . Smokeless tobacco: Never Used  Substance Use Topics  . Alcohol use: Not Currently    Comment: social    FAMILY HISTORY: Family History  Problem Relation Age of Onset  . Heart attack Mother   . Heart attack Father     EXAM: BP 125/78   Pulse 68   Resp 17   Ht 5' (1.524 m)   Wt 79.4 kg   SpO2 96%   BMI 34.18 kg/m  CONSTITUTIONAL: Alert and oriented x 4 and responds appropriately to questions.  Chronically ill-appearing.  Hard of hearing. HEAD: Normocephalic EYES: Conjunctivae clear, pupils appear equal, EOM appear intact ENT: normal nose; moist mucous membranes NECK: Supple, normal ROM CARD: RRR; S1 and S2 appreciated; no murmurs, no clicks, no rubs, no gallops RESP: Normal chest excursion without splinting or tachypnea; breath sounds clear and equal bilaterally; no wheezes, no rhonchi, no rales, no hypoxia or respiratory distress, speaking full sentences ABD/GI: Normal bowel sounds; non-distended; soft, non-tender, no rebound, no guarding, no peritoneal signs, no hepatosplenomegaly BACK:  The back appears normal EXT: Normal ROM in all joints; no deformity noted, no edema; no cyanosis SKIN: Normal color for age and race; warm; no rash on exposed skin NEURO: Moves all extremities equally, no drift, sensation to light touch intact diffusely, cranial nerves II through XII intact, normal speech PSYCH: The patient's mood and manner are appropriate.  No SI, HI, active visual auditory hallucinations.  Calm.  Good insight.  Good eye contact.  MEDICAL DECISION  MAKING: Patient here with visual hallucinations.  No other focal neurologic deficit.  States had this previously with acute kidney injury superimposed on chronic kidney disease.  Will check renal function today.  Also check urinalysis to evaluate for possible UTI, ethanol level and urine drug screen.  Will obtain head CT.   ED PROGRESS: Patient's labs appear at baseline.  Creatinine is 2.11.  TSH and free T4 normal.  Drug screen is positive for benzodiazepines.  She is prescribed Xanax.  Ethanol level, ammonia normal.  Leukos normal.  Head CT, chest x-ray negative.  EKG shows no arrhythmia or ischemic change.  Is still oriented x4.  She has not had any further visual hallucinations.  No SI, HI or auditory hallucinations.  I do not feel she has a psychiatric emergency at this time and she agrees that she would prefer to follow-up with her outpatient psychiatrist Dr. Robina Ade.  She is on multiple psychiatric medications but  denies any changes in these medications.  Symptoms may have been related to accidental ingestion of a second dose of her medications 24 hours ago.  I feel she is safe for discharge home.  She is comfortable with this plan.   At this time, I do not feel there is any life-threatening condition present. I have reviewed, interpreted and discussed all results (EKG, imaging, lab, urine as appropriate) and exam findings with patient/family. I have reviewed nursing notes and appropriate previous records.  I feel the patient is safe to be discharged home without further emergent workup and can continue workup as an outpatient as needed. Discussed usual and customary return precautions. Patient/family verbalize understanding and are comfortable with this plan.  Outpatient follow-up has been provided as needed. All questions have been answered.    EKG Interpretation  Date/Time:  Thursday April 06 2019 00:18:16 EST Ventricular Rate:  62 PR Interval:    QRS Duration: 93 QT Interval:  426 QTC  Calculation: 433 R Axis:   92 Text Interpretation: Sinus rhythm Right axis deviation Low voltage, precordial leads No significant change since last tracing Confirmed by Pryor Curia [phone]) on 04/06/2019 12:20:14 AM           Saraiyah Stampley was evaluated in Emergency Department on 04/05/2019 for the symptoms described in the history of present illness. She was evaluated in the context of the global COVID-19 pandemic, which necessitated consideration that the patient might be at risk for infection with the SARS-CoV-2 virus that causes COVID-19. Institutional protocols and algorithms that pertain to the evaluation of patients at risk for COVID-19 are in a state of rapid change based on information released by regulatory bodies including the CDC and federal and state organizations. These policies and algorithms were followed during the patient's care in the ED.  Patient was seen wearing N95, face shield, gloves.    Tahesha Skeet, Delice Bison, DO 04/06/19 0236

## 2019-04-05 NOTE — ED Triage Notes (Signed)
Pt brought in by EMS with c/o hallucinations. Pt has hx of anxiety, BPD, CKD, and DM.  Pt concerned with kidney function b/c she reports last time she had hallucinations at a behavior health facility, they found her to be in renal failure. Pt denies command hallucinations. Reports "I was in a [place] elevator with a bunch of models." Per EMS, husband reports pt has been more lethargic and may have taken her Wednesday meds twice unintentionally. Husband denies any meds today.

## 2019-04-06 ENCOUNTER — Emergency Department (HOSPITAL_COMMUNITY): Payer: Medicare Other

## 2019-04-06 LAB — URINALYSIS, ROUTINE W REFLEX MICROSCOPIC
Bilirubin Urine: NEGATIVE
Glucose, UA: NEGATIVE mg/dL
Hgb urine dipstick: NEGATIVE
Ketones, ur: NEGATIVE mg/dL
Leukocytes,Ua: NEGATIVE
Nitrite: NEGATIVE
Protein, ur: NEGATIVE mg/dL
Specific Gravity, Urine: 1.009 (ref 1.005–1.030)
pH: 6 (ref 5.0–8.0)

## 2019-04-06 LAB — COMPREHENSIVE METABOLIC PANEL
ALT: 94 U/L — ABNORMAL HIGH (ref 0–44)
AST: 66 U/L — ABNORMAL HIGH (ref 15–41)
Albumin: 3.4 g/dL — ABNORMAL LOW (ref 3.5–5.0)
Alkaline Phosphatase: 91 U/L (ref 38–126)
Anion gap: 12 (ref 5–15)
BUN: 33 mg/dL — ABNORMAL HIGH (ref 8–23)
CO2: 26 mmol/L (ref 22–32)
Calcium: 9.4 mg/dL (ref 8.9–10.3)
Chloride: 103 mmol/L (ref 98–111)
Creatinine, Ser: 2.11 mg/dL — ABNORMAL HIGH (ref 0.44–1.00)
GFR calc Af Amer: 28 mL/min — ABNORMAL LOW (ref >60)
GFR calc non Af Amer: 24 mL/min — ABNORMAL LOW (ref >60)
Glucose, Bld: 112 mg/dL — ABNORMAL HIGH (ref 70–99)
Potassium: 3.9 mmol/L (ref 3.5–5.1)
Sodium: 141 mmol/L (ref 135–145)
Total Bilirubin: 0.8 mg/dL (ref 0.3–1.2)
Total Protein: 7.1 g/dL (ref 6.5–8.1)

## 2019-04-06 LAB — CBG MONITORING, ED: Glucose-Capillary: 74 mg/dL (ref 70–99)

## 2019-04-06 LAB — CBC WITH DIFFERENTIAL/PLATELET
Abs Immature Granulocytes: 0.03 10*3/uL (ref 0.00–0.07)
Basophils Absolute: 0.1 10*3/uL (ref 0.0–0.1)
Basophils Relative: 1 %
Eosinophils Absolute: 0.5 10*3/uL (ref 0.0–0.5)
Eosinophils Relative: 6 %
HCT: 45.5 % (ref 36.0–46.0)
Hemoglobin: 14.4 g/dL (ref 12.0–15.0)
Immature Granulocytes: 0 %
Lymphocytes Relative: 24 %
Lymphs Abs: 2 10*3/uL (ref 0.7–4.0)
MCH: 30.3 pg (ref 26.0–34.0)
MCHC: 31.6 g/dL (ref 30.0–36.0)
MCV: 95.8 fL (ref 80.0–100.0)
Monocytes Absolute: 0.6 10*3/uL (ref 0.1–1.0)
Monocytes Relative: 8 %
Neutro Abs: 5.1 10*3/uL (ref 1.7–7.7)
Neutrophils Relative %: 61 %
Platelets: 180 10*3/uL (ref 150–400)
RBC: 4.75 MIL/uL (ref 3.87–5.11)
RDW: 12.5 % (ref 11.5–15.5)
WBC: 8.3 10*3/uL (ref 4.0–10.5)
nRBC: 0 % (ref 0.0–0.2)

## 2019-04-06 LAB — RAPID URINE DRUG SCREEN, HOSP PERFORMED
Amphetamines: NOT DETECTED
Barbiturates: NOT DETECTED
Benzodiazepines: POSITIVE — AB
Cocaine: NOT DETECTED
Opiates: NOT DETECTED
Tetrahydrocannabinol: NOT DETECTED

## 2019-04-06 LAB — TSH: TSH: 3.257 u[IU]/mL (ref 0.350–4.500)

## 2019-04-06 LAB — T4, FREE: Free T4: 0.88 ng/dL (ref 0.61–1.12)

## 2019-04-06 LAB — AMMONIA: Ammonia: 31 umol/L (ref 9–35)

## 2019-04-06 LAB — ETHANOL: Alcohol, Ethyl (B): <10 mg/dL (ref <10)

## 2019-04-06 MED ORDER — SODIUM CHLORIDE 0.9 % IV BOLUS (SEPSIS)
1000.0000 mL | Freq: Once | INTRAVENOUS | Status: AC
  Administered 2019-04-06: 01:00:00 1000 mL via INTRAVENOUS

## 2019-04-06 NOTE — ED Notes (Signed)
Patient's significant other was contacted and has arrived to pick up the patient.

## 2019-04-06 NOTE — ED Notes (Signed)
Attempted to call patient's significant other and nobody answered.

## 2019-04-06 NOTE — Discharge Instructions (Addendum)
Please follow-up with your outpatient psychiatrist.  Your symptoms may be related to your bipolar disorder or accidentally ingesting an extra dose of your psychiatric medications.  Your blood work today revealed no acute abnormalities.  Your creatinine was 2.11 which is at your baseline.  Your urine showed no sign of infection nor did your chest x-ray.  Your head CT was also unremarkable.  If you develop worsening hallucinations or hearing voices telling you to harm yourself or others, have suicidal or homicidal thoughts, please return immediately to the emergency department.

## 2019-04-06 NOTE — ED Notes (Signed)
Patient aware that we need urine sample for testing, unable at this time. Pt given instruction on providing urine sample when able to do so.   

## 2019-11-06 ENCOUNTER — Telehealth (INDEPENDENT_AMBULATORY_CARE_PROVIDER_SITE_OTHER): Payer: Medicare Other | Admitting: Psychiatry

## 2019-11-06 ENCOUNTER — Encounter: Payer: Self-pay | Admitting: Psychiatry

## 2019-11-06 ENCOUNTER — Other Ambulatory Visit: Payer: Self-pay

## 2019-11-06 DIAGNOSIS — F314 Bipolar disorder, current episode depressed, severe, without psychotic features: Secondary | ICD-10-CM

## 2019-11-06 NOTE — Progress Notes (Signed)
This is an ECT consult for this 64 year old woman referred by her outpatient psychiatrist, Dr.Kaur.  Patient was reached by telephone.  Established her identity and my own.  Establish the purpose of the conversation.  Total time on the telephone 60 minutes.  Patient reports that she currently has symptoms of consistently depressed sad down mood.  Low energy all the time.  Little motivation to do anything.  Sleep is stable perhaps more than usual with fatigue during the day.  Appetite normal.  Patient reports having some chronic suicidal thoughts without any specific plan or intent and makes clear to say that she has positive things in her life that would prevent her from killing herself.  She denies any hallucinations or delusions.  She is not drinking or abusing any drugs.  Patient states her current mood has been present for probably at least a year and that over the long-term she has had relatively little fluctuation in her mood.  She does say that she had a brief improvement about 6 months ago and that she last had a manic episode probably about 3 years ago.  Patient denies any specific new stress causing this.  Her mood disorder is chronic and has been present since her 19s.  Patient is currently seeing a psychiatrist for medication management and a therapist as well.  Her current psychiatric medicines are bupropion extended release 300 mg/day, trazodone 100 mg at night, Vraylar 3 mg/day.  Patient reports onset of mood symptoms in her 77s.  Diagnosed with bipolar disorder at age 28.  Reports that she has had several manic episodes but the great majority of her time has been spent in a depressed state.  Patient is currently taking antidepressants and medicines for bipolar depression.  She has been on multiple medicines in the past including several standard antidepressants and mood stabilizers.  She reports that she has had no consistent benefit that has been lasting with any medicine.  She reports 1  hospitalization in 2005.  She denies ever having tried to kill her self or having any history of violence.  She does not recall any history of psychosis even during the manic spells.  Patient lives with a long-term domestic partner.  She receives disability and spends most of her time in her house.  Has very minimal activities.  Some contact with other family members at times.  Patient has not had a history of mild diabetes which is currently diet controlled as well as mild hypertension.  She has no history of myocardial infarction, no history of stroke, no history of surgery to the head or head or brain injury.  Patient presented as alert oriented and appropriate in her cooperation.  She was aware of the appointment.  Affect was blunted and dysphoric and sounded tearful at times.  Reactive appropriately to the conversation.  Thoughts appeared to be lucid and clear without any disorganized or psychotic type thinking.  Says she has chronic suicidal thoughts with no plan or intent and has no homicidal thoughts.  Denies hallucinations or delusions.  Patient had some trouble with memory at times but did not appear in any way to be demented or delirious and appeared to have a clear understanding of her condition and a normal ability to make decisions about her care.  64 year old woman with bipolar disorder currently with severe depression without psychotic symptoms.  Appropriate medication without sustained benefit.  Continued disability and impairment.  No contraindications to ECT.  Patient is a reasonable candidate for ECT.  I described ECT in general and in the details of our treatment at the hospital.  Reviewed side effects including pain heart arrhythmia muscle aches short-term memory loss.  Patient states that she would like to proceed with the plan for ECT.  Earliest I believe that we would be able to start someone for a new index course would be September 13.  I have given the patient the telephone  number of the ECT office and given the patient's information to ECT nursing and utilization review to be on the look out.  I will send an order form for testing to the preadmission testing lab.  Patient would not need to stop or change any medicines she is taking in order to begin ECT treatment.  Patient agreed to this plan and had no further questions but knows she can call our office with further questions or concerns at any time.

## 2019-11-17 ENCOUNTER — Encounter
Admission: RE | Admit: 2019-11-17 | Discharge: 2019-11-17 | Disposition: A | Discharge status: Home / Self Care | Payer: Medicare Other | Source: Ambulatory Visit | Attending: Psychiatry | Admitting: Psychiatry

## 2019-11-17 ENCOUNTER — Ambulatory Visit
Admission: RE | Admit: 2019-11-17 | Discharge: 2019-11-17 | Disposition: A | Discharge status: Home / Self Care | Payer: Medicare Other | Source: Ambulatory Visit | Attending: Psychiatry | Admitting: Psychiatry

## 2019-11-17 ENCOUNTER — Other Ambulatory Visit: Payer: Self-pay

## 2019-11-17 ENCOUNTER — Other Ambulatory Visit: Payer: Self-pay | Admitting: Psychiatry

## 2019-11-17 DIAGNOSIS — F329 Major depressive disorder, single episode, unspecified: Secondary | ICD-10-CM | POA: Insufficient documentation

## 2019-11-17 DIAGNOSIS — Z01818 Encounter for other preprocedural examination: Secondary | ICD-10-CM | POA: Insufficient documentation

## 2019-11-17 DIAGNOSIS — F32A Depression, unspecified: Secondary | ICD-10-CM

## 2019-11-17 LAB — CBC
HCT: 43.3 % (ref 36.0–46.0)
Hemoglobin: 14.3 g/dL (ref 12.0–15.0)
MCH: 31 pg (ref 26.0–34.0)
MCHC: 33 g/dL (ref 30.0–36.0)
MCV: 93.7 fL (ref 80.0–100.0)
Platelets: 147 10*3/uL — ABNORMAL LOW (ref 150–400)
RBC: 4.62 MIL/uL (ref 3.87–5.11)
RDW: 12.2 % (ref 11.5–15.5)
WBC: 7.9 10*3/uL (ref 4.0–10.5)
nRBC: 0 % (ref 0.0–0.2)

## 2019-11-17 LAB — BASIC METABOLIC PANEL
Anion gap: 8 (ref 5–15)
BUN: 27 mg/dL — ABNORMAL HIGH (ref 8–23)
CO2: 30 mmol/L (ref 22–32)
Calcium: 9.5 mg/dL (ref 8.9–10.3)
Chloride: 102 mmol/L (ref 98–111)
Creatinine, Ser: 1.87 mg/dL — ABNORMAL HIGH (ref 0.44–1.00)
GFR calc Af Amer: 32 mL/min — ABNORMAL LOW (ref >60)
GFR calc non Af Amer: 28 mL/min — ABNORMAL LOW (ref >60)
Glucose, Bld: 104 mg/dL — ABNORMAL HIGH (ref 70–99)
Potassium: 4.3 mmol/L (ref 3.5–5.1)
Sodium: 140 mmol/L (ref 135–145)

## 2019-11-17 NOTE — Patient Instructions (Signed)
Your procedure is scheduled on: Mon. 9/13 Report to Day Surgery. To find out your arrival time please call [phone] between 1PM - 3PM on Friday 9/10.  Remember: Instructions that are not followed completely may result in serious medical risk,  up to and including death, or upon the discretion of your surgeon and anesthesiologist your  surgery may need to be rescheduled.     _X__ 1. Do not eat food after midnight the night before your procedure.                 No chewing gum or hard candies. You may drink clear liquids up to 2 hours                 before you are scheduled to arrive for your surgery- DO not drink clear                 liquids within 2 hours of the start of your surgery.                 Clear Liquids include:  water, G2 or                  Gatorade Zero (avoid Red/Purple/Blue), Black Coffee or Tea (Do not add                 anything to coffee or tea). _____2.   Complete the "Ensure Clear Pre-surgery Clear Carbohydrate Drink" provided to you, 2 hours before arrival. **If you       are diabetic you will be provided with an alternative drink, Gatorade Zero or G2.  __X__2.  On the morning of surgery brush your teeth with toothpaste and water, you                may rinse your mouth with mouthwash if you wish.  Do not swallow any toothpaste of mouthwash.     ___ 3.  No Alcohol for 24 hours before or after surgery.   ___ 4.  Do Not Smoke or use e-cigarettes For 24 Hours Prior to Your Surgery.                 Do not use any chewable tobacco products for at least 6 hours prior to                 Surgery.  ___  5.  Do not use any recreational drugs (marijuana, cocaine, heroin, ecstasy, MDMA or other)                For at least one week prior to your surgery.  Combination of these drugs with anesthesia                May have life threatening results.  ____  6.  Bring all medications with you on the day of surgery if instructed.   __x__  7.  Notify  your doctor if there is any change in your medical condition      (cold, fever, infections).     Do not wear jewelry, make-up, hairpins, clips or nail polish. Do not wear lotions, powders, or perfumes. You may wear deodorant. Do not shave 48 hours prior to surgery. Men may shave face and neck. Do not bring valuables to the hospital.    [facility] is not responsible for any belongings or valuables.  Contacts, dentures or bridgework may not be worn into surgery. Leave your suitcase in the car. After surgery  it may be brought to your room. For patients admitted to the hospital, discharge time is determined by your treatment team.   Patients discharged the day of surgery will not be allowed to drive home.   Make arrangements for someone to be with you for the first 24 hours of your Same Day Discharge.    Please read over the following fact sheets that you were given:    __x__ Take these medicines the morning of surgery with A SIP OF WATER:    1. Bupropion  2. Austedo   3. Xanax  If needed  4.synthroid  5.ranitidine take dose the night before and the morning of the surgery  6.caarvedilol  ____ Fleet Enema (as directed)   ____ Use CHG Soap (or wipes) as directed  ____ Use Benzoyl Peroxide Gel as instructed  __x__ Use inhalers on the day of surgery      Symbicort  ____ Stop metformin 2 days prior to surgery    ____ Take 1/2 of usual insulin dose the night before surgery. No insulin the morning          of surgery.   __x__ continue aspirin   Don't take am of surgery  ____ Stop Anti-inflammatories on    ____ Stop supplements until after surgery.    ____ Bring C-Pap to the hospital.    If you have any questions regarding your pre-procedure instructions,  Please call Pre-admit Testing at [phone]

## 2019-11-30 ENCOUNTER — Other Ambulatory Visit: Payer: Self-pay

## 2019-11-30 ENCOUNTER — Other Ambulatory Visit
Admission: RE | Admit: 2019-11-30 | Discharge: 2019-11-30 | Disposition: A | Discharge status: Home / Self Care | Payer: Medicare Other | Source: Ambulatory Visit | Attending: Family Medicine | Admitting: Family Medicine

## 2019-11-30 DIAGNOSIS — Z01812 Encounter for preprocedural laboratory examination: Secondary | ICD-10-CM | POA: Diagnosis present

## 2019-11-30 DIAGNOSIS — Z20822 Contact with and (suspected) exposure to covid-19: Secondary | ICD-10-CM | POA: Insufficient documentation

## 2019-12-01 LAB — SARS CORONAVIRUS 2 (TAT 6-24 HRS): SARS Coronavirus 2: NEGATIVE

## 2019-12-04 ENCOUNTER — Encounter: Payer: Self-pay | Admitting: Anesthesiology

## 2019-12-04 ENCOUNTER — Other Ambulatory Visit: Payer: Self-pay | Admitting: Psychiatry

## 2019-12-04 ENCOUNTER — Encounter
Admission: RE | Admit: 2019-12-04 | Discharge: 2019-12-04 | Disposition: A | Discharge status: Home / Self Care | Payer: Medicare Other | Source: Ambulatory Visit | Attending: Psychiatry | Admitting: Psychiatry

## 2019-12-04 ENCOUNTER — Other Ambulatory Visit: Payer: Self-pay

## 2019-12-04 DIAGNOSIS — E1122 Type 2 diabetes mellitus with diabetic chronic kidney disease: Secondary | ICD-10-CM | POA: Insufficient documentation

## 2019-12-04 DIAGNOSIS — F329 Major depressive disorder, single episode, unspecified: Secondary | ICD-10-CM | POA: Insufficient documentation

## 2019-12-04 DIAGNOSIS — F332 Major depressive disorder, recurrent severe without psychotic features: Secondary | ICD-10-CM

## 2019-12-04 DIAGNOSIS — I10 Essential (primary) hypertension: Secondary | ICD-10-CM | POA: Diagnosis present

## 2019-12-04 DIAGNOSIS — N183 Chronic kidney disease, stage 3 unspecified: Secondary | ICD-10-CM | POA: Diagnosis not present

## 2019-12-04 LAB — GLUCOSE, CAPILLARY: Glucose-Capillary: 105 mg/dL — ABNORMAL HIGH (ref 70–99)

## 2019-12-04 MED ORDER — PROPOFOL 10 MG/ML IV BOLUS
INTRAVENOUS | Status: DC | PRN
  Administered 2019-12-04: 80 mg via INTRAVENOUS

## 2019-12-04 MED ORDER — SODIUM CHLORIDE 0.9 % IV SOLN: 500.0000 mL | Freq: Once | INTRAVENOUS | Status: AC

## 2019-12-04 NOTE — Transfer of Care (Signed)
Immediate Anesthesia Transfer of Care Note  Patient: Denise Serrano  Procedure(s) Performed: ECT TX  Patient Location: PACU  Anesthesia Type:General  Level of Consciousness: drowsy and patient cooperative  Airway & Oxygen Therapy: Patient Spontanous Breathing  Post-op Assessment: Report given to RN and Post -op Vital signs reviewed and stable  Post vital signs: Reviewed and stable  Last Vitals:  Vitals Value Taken Time  BP 119/70 12/04/19 1131  Temp    Pulse 57 12/04/19 1132  Resp 11 12/04/19 1132  SpO2 93 % 12/04/19 1132  Vitals shown include unvalidated device data.  Last Pain:  Vitals:   12/04/19 0940  TempSrc:   PainSc: 0-No pain         Complications: No complications documented.

## 2019-12-04 NOTE — Anesthesia Preprocedure Evaluation (Signed)
Anesthesia Evaluation  Patient identified by MRN, date of birth, ID band Patient awake    Reviewed: Allergy & Precautions, NPO status , Patient's Chart, lab work & pertinent test results  History of Anesthesia Complications Negative for: history of anesthetic complications  Airway Mallampati: II  TM Distance: >3 FB Neck ROM: Full    Dental  (+) Poor Dentition   Pulmonary asthma , sleep apnea ,    breath sounds clear to auscultation- rhonchi (-) wheezing      Cardiovascular hypertension, Pt. on medications (-) CAD, (-) Past MI, (-) Cardiac Stents and (-) CABG  Rhythm:Regular Rate:Normal - Systolic murmurs and - Diastolic murmurs    Neuro/Psych neg Seizures PSYCHIATRIC DISORDERS Anxiety Bipolar Disorder negative neurological ROS     GI/Hepatic Neg liver ROS, GERD  ,  Endo/Other  diabetesHypothyroidism   Renal/GU CRFRenal disease     Musculoskeletal  (+) Arthritis ,   Abdominal (+) + obese,   Peds  Hematology  (+) anemia ,   Anesthesia Other Findings Past Medical History: No date: Anxiety No date: Arthritis of knee, left No date: Asthma No date: Bipolar 1 disorder, mixed (HCC) No date: Carpal tunnel syndrome     Comment:  bilateral BILATERAL: Cataract immature MONITORED BY DR GOLDSBOROUGH: Chronic kidney disease (CKD), stage III  (moderate) No date: Diabetes mellitus without complication (HCC)     Comment:  no meds.  diet control borderline-  monitored: Elevated blood pressure (not hypertension) No date: Hyperlipidemia No date: Hypertension No date: Hypothyroidism hearing aid- bilateral: Impaired hearing No date: Nocturia No date: OSA on CPAP No date: Seasonal allergies No date: Stress incontinence, female No date: Urgency of urination   Reproductive/Obstetrics                             Anesthesia Physical Anesthesia Plan  ASA: III  Anesthesia Plan: General   Post-op  Pain Management:    Induction: Intravenous  PONV Risk Score and Plan: 2 and Ondansetron  Airway Management Planned: Mask  Additional Equipment:   Intra-op Plan:   Post-operative Plan:   Informed Consent: I have reviewed the patients History and Physical, chart, labs and discussed the procedure including the risks, benefits and alternatives for the proposed anesthesia with the patient or authorized representative who has indicated his/her understanding and acceptance.     Dental advisory given  Plan Discussed with: CRNA and Anesthesiologist  Anesthesia Plan Comments:         Anesthesia Quick Evaluation

## 2019-12-04 NOTE — Procedures (Signed)
ECT SERVICES Physician's Interval Evaluation & Treatment Note  Patient Identification: Denise Serrano MRN:  [id number] Date of Evaluation:  12/04/2019 TX #: 1  MADRS:   MMSE:   P.E. Findings:  Physical exam unremarkable.  Heart and lungs normal.  Psychiatric Interval Note:  Continues to report depression no suicidal ideation hopelessness negativity tearfulness  Subjective:  [name] seen for evaluation for Electroconvulsive Therapy. Chronic depression  Treatment Summary:   [x]   Right Unilateral             []  Bilateral   % Energy : 0.3 ms 65%   Impedance: 2200 ohms  Seizure Energy Index: 2030 V squared  Postictal Suppression Index: Less than 10% (not a correct evaluation)  Seizure Concordance Index: 86%  Medications  Pre Shock: Propofol 80 mg succinylcholine 80 mg  Post Shock:    Seizure Duration: 13 seconds EMG 19 seconds EEG   Comments: An adequate level seizure.  Next treatment Wednesday.  Stop propofol replace with ketamine  Lungs:  [x]   Clear to auscultation               []  Other:   Heart:    [x]   Regular rhythm             []  irregular rhythm    [x]   Previous H&P reviewed, patient examined and there are NO CHANGES                 []   Previous H&P reviewed, patient examined and there are changes noted.   Alethia Berthold, MD 9/13/20214:38 PM

## 2019-12-04 NOTE — Anesthesia Postprocedure Evaluation (Signed)
Anesthesia Post Note  Patient: [name], applications  Procedure(s) Performed: ECT TX  Patient location during evaluation: PACU Anesthesia Type: General Level of consciousness: awake and alert Pain management: pain level controlled Vital Signs Assessment: post-procedure vital signs reviewed and stable Respiratory status: spontaneous breathing, nonlabored ventilation and respiratory function stable Cardiovascular status: blood pressure returned to baseline and stable Postop Assessment: no signs of nausea or vomiting Anesthetic complications: no   No complications documented.   Last Vitals:  Vitals:   12/04/19 1140 12/04/19 1150  BP: 109/67 115/67  Pulse: (!) 57 (!) 55  Resp: 12 11  Temp:    SpO2: 92% 93%    Last Pain:  Vitals:   12/04/19 1130  TempSrc:   PainSc: Asleep                 Quincie Haroon

## 2019-12-04 NOTE — H&P (Signed)
Denise Serrano is an 64 y.o. female.   Chief Complaint: recurrent severe depression HPI: history of depression and past history of mania  Past Medical History:  Diagnosis Date  . Anxiety   . Arthritis of knee, left   . Asthma   . Bipolar 1 disorder, mixed ([place])   . Carpal tunnel syndrome    bilateral  . Cataract immature BILATERAL  . Chronic kidney disease (CKD), stage III (moderate) MONITORED BY DR GOLDSBOROUGH  . Diabetes mellitus without complication (HCC)    no meds.  diet control  . Elevated blood pressure (not hypertension) borderline-  monitored  . Hyperlipidemia   . Hypertension   . Hypothyroidism   . Impaired hearing hearing aid- bilateral  . Nocturia   . OSA on CPAP   . Seasonal allergies   . Stress incontinence, female   . Urgency of urination     Past Surgical History:  Procedure Laterality Date  . BLADDER SUSPENSION  05/07/2011   Procedure: [facility] PROCEDURE;  Surgeon: Reece Packer, MD;  Location: [facility];  Service: Urology;  Laterality: N/A;  cysto, sparc sling   . DX LAPAROSCOPY FOR INFERTILITY  27 YRS AGO  . TUBAL LIGATION  20 YRS AGO    Family History  Problem Relation Age of Onset  . Heart attack Mother   . Heart attack Father    Social History:  reports that she has never smoked. She has never used smokeless tobacco. She reports previous alcohol use. She reports that she does not use drugs.  Allergies:  Allergies  Allergen Reactions  . Amoxicillin Diarrhea  . Iodine Other (See Comments)    Betadine soap causes her to want to pass out  . Penicillin G Diarrhea    (Not in a hospital admission)   No results found for this or any previous visit (from the past 48 hour(s)). No results found.  Review of Systems  Constitutional: Negative.   HENT: Negative.   Eyes: Negative.   Respiratory: Negative.   Cardiovascular: Negative.   Gastrointestinal: Negative.   Musculoskeletal: Negative.   Skin: Negative.   Neurological:  Negative.   Psychiatric/Behavioral: Positive for dysphoric mood and sleep disturbance. The patient is nervous/anxious.     Blood pressure 111/64, pulse (!) 56, temperature 98.5 F (36.9 C), temperature source Oral, resp. rate 18, height 5' (1.524 m), weight 75.3 kg, SpO2 98 %. Physical Exam Constitutional:      Appearance: She is well-developed.  HENT:     Head: Normocephalic and atraumatic.  Eyes:     Conjunctiva/sclera: Conjunctivae normal.     Pupils: Pupils are equal, round, and reactive to light.  Cardiovascular:     Heart sounds: Normal heart sounds.  Pulmonary:     Effort: Pulmonary effort is normal.  Abdominal:     Palpations: Abdomen is soft.  Musculoskeletal:        General: Normal range of motion.     Cervical back: Normal range of motion.  Skin:    General: Skin is warm and dry.  Neurological:     General: No focal deficit present.     Mental Status: She is alert.  Psychiatric:        Attention and Perception: Attention normal.        Mood and Affect: Mood is depressed. Affect is flat and tearful.        Speech: Speech is delayed.        Behavior: Behavior is slowed.  Thought Content: Thought content is not paranoid or delusional. Thought content does not include homicidal or suicidal ideation.        Cognition and Memory: Cognition normal.        Judgment: Judgment normal.      Assessment/Plan Start ect rul  Alethia Berthold, MD 12/04/2019, 9:57 AM

## 2019-12-05 ENCOUNTER — Other Ambulatory Visit: Payer: Self-pay | Admitting: Psychiatry

## 2019-12-06 ENCOUNTER — Other Ambulatory Visit: Payer: Self-pay

## 2019-12-06 ENCOUNTER — Ambulatory Visit
Admission: RE | Admit: 2019-12-06 | Discharge: 2019-12-06 | Disposition: A | Discharge status: Home / Self Care | Payer: Medicare Other | Source: Ambulatory Visit | Attending: Psychiatry | Admitting: Psychiatry

## 2019-12-06 ENCOUNTER — Encounter: Payer: Self-pay | Admitting: Anesthesiology

## 2019-12-06 ENCOUNTER — Ambulatory Visit: Payer: Self-pay | Admitting: Anesthesiology

## 2019-12-06 DIAGNOSIS — J45909 Unspecified asthma, uncomplicated: Secondary | ICD-10-CM | POA: Diagnosis not present

## 2019-12-06 DIAGNOSIS — E1122 Type 2 diabetes mellitus with diabetic chronic kidney disease: Secondary | ICD-10-CM | POA: Insufficient documentation

## 2019-12-06 DIAGNOSIS — F339 Major depressive disorder, recurrent, unspecified: Secondary | ICD-10-CM | POA: Diagnosis present

## 2019-12-06 DIAGNOSIS — F319 Bipolar disorder, unspecified: Secondary | ICD-10-CM | POA: Diagnosis not present

## 2019-12-06 DIAGNOSIS — G4733 Obstructive sleep apnea (adult) (pediatric): Secondary | ICD-10-CM | POA: Insufficient documentation

## 2019-12-06 DIAGNOSIS — F332 Major depressive disorder, recurrent severe without psychotic features: Secondary | ICD-10-CM | POA: Diagnosis not present

## 2019-12-06 DIAGNOSIS — Z880 Allergy status to penicillin: Secondary | ICD-10-CM | POA: Diagnosis not present

## 2019-12-06 DIAGNOSIS — N183 Chronic kidney disease, stage 3 unspecified: Secondary | ICD-10-CM | POA: Diagnosis not present

## 2019-12-06 LAB — GLUCOSE, CAPILLARY: Glucose-Capillary: 128 mg/dL — ABNORMAL HIGH (ref 70–99)

## 2019-12-06 MED ORDER — SUCCINYLCHOLINE CHLORIDE 20 MG/ML IJ SOLN
INTRAMUSCULAR | Status: DC | PRN
  Administered 2019-12-06: 80 mg via INTRAVENOUS

## 2019-12-06 MED ORDER — METHOHEXITAL SODIUM 100 MG/10ML IV SOSY
PREFILLED_SYRINGE | INTRAVENOUS | Status: DC | PRN
  Administered 2019-12-06: 70 mg via INTRAVENOUS

## 2019-12-06 MED ORDER — SODIUM CHLORIDE 0.9 % IV SOLN: 500.0000 mL | Freq: Once | INTRAVENOUS | Status: AC

## 2019-12-06 MED ORDER — FENTANYL CITRATE (PF) 100 MCG/2ML IJ SOLN: 25.0000 ug | INTRAMUSCULAR | Status: DC | PRN

## 2019-12-06 MED ORDER — ONDANSETRON HCL 4 MG/2ML IJ SOLN: 4.0000 mg | Freq: Once | INTRAMUSCULAR | Status: DC | PRN

## 2019-12-06 NOTE — Anesthesia Postprocedure Evaluation (Signed)
Anesthesia Post Note  Patient: [name], applications  Procedure(s) Performed: ECT TX  Patient location during evaluation: PACU Anesthesia Type: General Level of consciousness: awake and alert Pain management: pain level controlled Vital Signs Assessment: post-procedure vital signs reviewed and stable Respiratory status: spontaneous breathing, nonlabored ventilation, respiratory function stable and patient connected to nasal cannula oxygen Cardiovascular status: blood pressure returned to baseline and stable Postop Assessment: no apparent nausea or vomiting Anesthetic complications: no   No complications documented.   Last Vitals:  Vitals:   12/06/19 0822 12/06/19 1100  BP: 127/74 (!) 150/73  Pulse: 64 73  Resp: 16 12  Temp: 36.4 C (!) 36.1 C  SpO2: 96% 100%    Last Pain:  Vitals:   12/06/19 1100  TempSrc:   PainSc: 0-No pain                 Molli Barrows

## 2019-12-06 NOTE — Anesthesia Preprocedure Evaluation (Signed)
Anesthesia Evaluation  Patient identified by MRN, date of birth, ID band Patient awake    Reviewed: Allergy & Precautions, H&P , NPO status , Patient's Chart, lab work & pertinent test results, reviewed documented beta blocker date and time   Airway Mallampati: II   Neck ROM: full    Dental  (+) Teeth Intact   Pulmonary asthma , sleep apnea ,    Pulmonary exam normal        Cardiovascular Exercise Tolerance: Good hypertension, On Medications negative cardio ROS Normal cardiovascular exam Rhythm:regular Rate:Normal     Neuro/Psych Anxiety Bipolar Disorder  Neuromuscular disease negative psych ROS   GI/Hepatic Neg liver ROS, GERD  Medicated,  Endo/Other  diabetesHypothyroidism   Renal/GU Renal disease  negative genitourinary   Musculoskeletal   Abdominal   Peds  Hematology  (+) Blood dyscrasia, anemia ,   Anesthesia Other Findings Past Medical History: No date: Anxiety No date: Arthritis of knee, left No date: Asthma No date: Bipolar 1 disorder, mixed (HCC) No date: Carpal tunnel syndrome     Comment:  bilateral BILATERAL: Cataract immature MONITORED BY DR GOLDSBOROUGH: Chronic kidney disease (CKD), stage III  (moderate) No date: Diabetes mellitus without complication (HCC)     Comment:  no meds.  diet control borderline-  monitored: Elevated blood pressure (not hypertension) No date: Hyperlipidemia No date: Hypertension No date: Hypothyroidism hearing aid- bilateral: Impaired hearing No date: Nocturia No date: OSA on CPAP No date: Seasonal allergies No date: Stress incontinence, female No date: Urgency of urination Past Surgical History: 05/07/2011: BLADDER SUSPENSION     Comment:  Procedure: [place] PROCEDURE;  Surgeon: Reece Packer, MD;  Location: [place];                Service: Urology;  Laterality: N/A;  cysto, sparc sling  27 YRS AGO: DX LAPAROSCOPY FOR  INFERTILITY 20 YRS AGO: TUBAL LIGATION   Reproductive/Obstetrics negative OB ROS                             Anesthesia Physical Anesthesia Plan  ASA: III  Anesthesia Plan: General   Post-op Pain Management:    Induction:   PONV Risk Score and Plan:   Airway Management Planned:   Additional Equipment:   Intra-op Plan:   Post-operative Plan:   Informed Consent: I have reviewed the patients History and Physical, chart, labs and discussed the procedure including the risks, benefits and alternatives for the proposed anesthesia with the patient or authorized representative who has indicated his/her understanding and acceptance.     Dental Advisory Given  Plan Discussed with: CRNA  Anesthesia Plan Comments:         Anesthesia Quick Evaluation

## 2019-12-06 NOTE — H&P (Signed)
Denise Serrano is an 64 y.o. female.   Chief Complaint: chronic depression HPI: recurrent depression  Past Medical History:  Diagnosis Date  . Anxiety   . Arthritis of knee, left   . Asthma   . Bipolar 1 disorder, mixed ([place])   . Carpal tunnel syndrome    bilateral  . Cataract immature BILATERAL  . Chronic kidney disease (CKD), stage III (moderate) MONITORED BY DR GOLDSBOROUGH  . Diabetes mellitus without complication (HCC)    no meds.  diet control  . Elevated blood pressure (not hypertension) borderline-  monitored  . Hyperlipidemia   . Hypertension   . Hypothyroidism   . Impaired hearing hearing aid- bilateral  . Nocturia   . OSA on CPAP   . Seasonal allergies   . Stress incontinence, female   . Urgency of urination     Past Surgical History:  Procedure Laterality Date  . BLADDER SUSPENSION  05/07/2011   Procedure: [facility] PROCEDURE;  Surgeon: Reece Packer, MD;  Location: [facility];  Service: Urology;  Laterality: N/A;  cysto, sparc sling   . DX LAPAROSCOPY FOR INFERTILITY  27 YRS AGO  . TUBAL LIGATION  20 YRS AGO    Family History  Problem Relation Age of Onset  . Heart attack Mother   . Heart attack Father    Social History:  reports that she has never smoked. She has never used smokeless tobacco. She reports previous alcohol use. She reports that she does not use drugs.  Allergies:  Allergies  Allergen Reactions  . Amoxicillin Diarrhea  . Iodine Other (See Comments)    Betadine soap causes her to want to pass out  . Penicillin G Diarrhea    (Not in a hospital admission)   Results for orders placed or performed during the hospital encounter of 12/06/19 (from the past 48 hour(s))  Glucose, capillary     Status: Abnormal   Collection Time: 12/06/19  8:29 AM  Result Value Ref Range   Glucose-Capillary 128 (H) 70 - 99 mg/dL    Comment: Glucose reference range applies only to samples taken after fasting for at least 8 hours.   No  results found.  Review of Systems  Constitutional: Negative.   HENT: Negative.   Eyes: Negative.   Respiratory: Negative.   Cardiovascular: Negative.   Gastrointestinal: Negative.   Musculoskeletal: Negative.   Skin: Negative.   Neurological: Negative.   Psychiatric/Behavioral: Positive for dysphoric mood. Negative for confusion, decreased concentration, hallucinations and suicidal ideas. The patient is not hyperactive.     Blood pressure 127/74, pulse 64, temperature 97.6 F (36.4 C), temperature source Oral, resp. rate 16, SpO2 96 %. Physical Exam Vitals and nursing note reviewed.  Constitutional:      Appearance: She is well-developed.  HENT:     Head: Normocephalic and atraumatic.  Eyes:     Conjunctiva/sclera: Conjunctivae normal.     Pupils: Pupils are equal, round, and reactive to light.  Cardiovascular:     Heart sounds: Normal heart sounds.  Pulmonary:     Effort: Pulmonary effort is normal.  Abdominal:     Palpations: Abdomen is soft.  Musculoskeletal:        General: Normal range of motion.     Cervical back: Normal range of motion.  Skin:    General: Skin is warm and dry.  Neurological:     General: No focal deficit present.     Mental Status: She is alert.  Psychiatric:  Attention and Perception: Attention normal.        Mood and Affect: Mood is depressed.        Speech: Speech normal.        Behavior: Behavior normal.        Thought Content: Thought content normal.        Cognition and Memory: Cognition normal.        Judgment: Judgment normal.      Assessment/Plan Continue index treatment  Alethia Berthold, MD 12/06/2019, 9:54 AM

## 2019-12-06 NOTE — Procedures (Signed)
ECT SERVICES Physician's Interval Evaluation & Treatment Note  Patient Identification: Denise Serrano MRN:  [id number] Date of Evaluation:  12/06/2019 TX #: 2  MADRS:   MMSE:   P.E. Findings:  No change physical exam  Psychiatric Interval Note:  No new complaints  Subjective:  [name] seen for evaluation for Electroconvulsive Therapy. Mood still depressed but stable  Treatment Summary:   [x]   Right Unilateral             []  Bilateral   % Energy : 0.3 ms 100%   Impedance: 1900 ohms  Seizure Energy Index: 2100 V squared  Postictal Suppression Index: 54%  Seizure Concordance Index: 41%  Medications  Pre Shock: Brevital 70 mg succinylcholine 80 mg  Post Shock: 18 seconds EMG 23 seconds EEG  Seizure Duration: 18 seconds EMG 23 seconds EEG   Comments: Poor quality and a short seizure.  Switch to only ketamine   Lungs:  [x]   Clear to auscultation               []  Other:   Heart:    [x]   Regular rhythm             []  irregular rhythm    [x]   Previous H&P reviewed, patient examined and there are NO CHANGES                 []   Previous H&P reviewed, patient examined and there are changes noted.   Denise Berthold, MD 9/15/20213:58 PM

## 2019-12-06 NOTE — Transfer of Care (Signed)
Immediate Anesthesia Transfer of Care Note  Patient: Denise Serrano  Procedure(s) Performed: ECT TX  Patient Location: PACU  Anesthesia Type:General  Level of Consciousness: awake and drowsy  Airway & Oxygen Therapy: Patient Spontanous Breathing and Patient connected to face mask oxygen  Post-op Assessment: Report given to RN and Post -op Vital signs reviewed and stable  Post vital signs: Reviewed and stable  Last Vitals:  Vitals Value Taken Time  BP 150/73 12/06/19 1100  Temp 36.1 C 12/06/19 1100  Pulse 70 12/06/19 1102  Resp 12 12/06/19 1102  SpO2 100 % 12/06/19 1102  Vitals shown include unvalidated device data.  Last Pain:  Vitals:   12/06/19 1100  TempSrc:   PainSc: 0-No pain         Complications: No complications documented.

## 2019-12-07 ENCOUNTER — Other Ambulatory Visit: Payer: Self-pay | Admitting: Psychiatry

## 2019-12-08 ENCOUNTER — Encounter (HOSPITAL_BASED_OUTPATIENT_CLINIC_OR_DEPARTMENT_OTHER)
Admission: RE | Admit: 2019-12-08 | Discharge: 2019-12-08 | Disposition: A | Discharge status: Home / Self Care | Payer: Medicare Other | Source: Ambulatory Visit | Attending: Psychiatry | Admitting: Psychiatry

## 2019-12-08 ENCOUNTER — Other Ambulatory Visit: Payer: Self-pay

## 2019-12-08 ENCOUNTER — Other Ambulatory Visit: Payer: Self-pay | Admitting: Psychiatry

## 2019-12-08 ENCOUNTER — Encounter: Payer: Self-pay | Admitting: Anesthesiology

## 2019-12-08 ENCOUNTER — Ambulatory Visit: Payer: Self-pay | Admitting: Anesthesiology

## 2019-12-08 DIAGNOSIS — F332 Major depressive disorder, recurrent severe without psychotic features: Secondary | ICD-10-CM | POA: Diagnosis not present

## 2019-12-08 DIAGNOSIS — F329 Major depressive disorder, single episode, unspecified: Secondary | ICD-10-CM | POA: Diagnosis not present

## 2019-12-08 LAB — GLUCOSE, CAPILLARY
Glucose-Capillary: 114 mg/dL — ABNORMAL HIGH (ref 70–99)
Glucose-Capillary: 118 mg/dL — ABNORMAL HIGH (ref 70–99)

## 2019-12-08 MED ORDER — SODIUM CHLORIDE 0.9 % IV SOLN
500.0000 mL | Freq: Once | INTRAVENOUS | Status: AC
  Administered 2019-12-08: 500 mL via INTRAVENOUS

## 2019-12-08 MED ORDER — SUCCINYLCHOLINE CHLORIDE 20 MG/ML IJ SOLN
INTRAMUSCULAR | Status: DC | PRN
  Administered 2019-12-08: 80 mg via INTRAVENOUS

## 2019-12-08 MED ORDER — KETAMINE HCL 10 MG/ML IJ SOLN
INTRAMUSCULAR | Status: DC | PRN
  Administered 2019-12-08: 80 mg via INTRAVENOUS

## 2019-12-08 NOTE — Transfer of Care (Signed)
Immediate Anesthesia Transfer of Care Note  Patient: Denise Serrano  Procedure(s) Performed: ECT TX  Patient Location: PACU  Anesthesia Type:General  Level of Consciousness: awake  Airway & Oxygen Therapy: Patient Spontanous Breathing and Patient connected to face mask oxygen  Post-op Assessment: Report given to RN and Post -op Vital signs reviewed and stable  Post vital signs: Reviewed  Last Vitals:  Vitals Value Taken Time  BP    Temp    Pulse 75 12/08/19 1111  Resp    SpO2 99 % 12/08/19 1111  Vitals shown include unvalidated device data.  Last Pain:  Vitals:   12/08/19 0821  TempSrc:   PainSc: 0-No pain         Complications: No complications documented.

## 2019-12-08 NOTE — Procedures (Signed)
ECT SERVICES Physician's Interval Evaluation & Treatment Note  Patient Identification: Denise Serrano MRN:  [id number] Date of Evaluation:  12/08/2019 TX #: 3  MADRS:   MMSE:   P.E. Findings:  No change to physical exam  Psychiatric Interval Note:  Calm and cooperative but not reporting any difference in her symptoms  Subjective:  Patient is a 65 y.o. female seen for evaluation for Electroconvulsive Therapy. Chronically depressed  Treatment Summary:   [x]   Right Unilateral             []  Bilateral   % Energy : 0.3 ms 100%   Impedance: 1520 ohms  Seizure Energy Index: 6061 V squared  Postictal Suppression Index: 80%  Seizure Concordance Index: No reading  Medications  Pre Shock: Ketamine 80 mg succinylcholine 80 mg  Post Shock:    Seizure Duration: This was a really bad reading because some of the electrodes fell off.  Clearly a very short seizure.  Estimate 12 seconds motor movement 17 seconds EEG   Comments: Patient having minimal seizures despite switching to ketamine.  We will retry again on Monday and see if we get a better seizure.  Lungs:  [x]   Clear to auscultation               []  Other:   Heart:    [x]   Regular rhythm             []  irregular rhythm    [x]   Previous H&P reviewed, patient examined and there are NO CHANGES                 []   Previous H&P reviewed, patient examined and there are changes noted.   Alethia Berthold, MD 9/17/20216:24 PM

## 2019-12-08 NOTE — H&P (Signed)
Denise Serrano is an 64 y.o. female.   Chief Complaint: no new complaint. No side effects HPI: recurrent depression  Past Medical History:  Diagnosis Date  . Anxiety   . Arthritis of knee, left   . Asthma   . Bipolar 1 disorder, mixed ([place])   . Carpal tunnel syndrome    bilateral  . Cataract immature BILATERAL  . Chronic kidney disease (CKD), stage III (moderate) MONITORED BY DR GOLDSBOROUGH  . Diabetes mellitus without complication (HCC)    no meds.  diet control  . Elevated blood pressure (not hypertension) borderline-  monitored  . Hyperlipidemia   . Hypertension   . Hypothyroidism   . Impaired hearing hearing aid- bilateral  . Nocturia   . OSA on CPAP   . Seasonal allergies   . Stress incontinence, female   . Urgency of urination     Past Surgical History:  Procedure Laterality Date  . BLADDER SUSPENSION  05/07/2011   Procedure: [facility] PROCEDURE;  Surgeon: Reece Packer, MD;  Location: [facility];  Service: Urology;  Laterality: N/A;  cysto, sparc sling   . DX LAPAROSCOPY FOR INFERTILITY  27 YRS AGO  . TUBAL LIGATION  20 YRS AGO    Family History  Problem Relation Age of Onset  . Heart attack Mother   . Heart attack Father    Social History:  reports that she has never smoked. She has never used smokeless tobacco. She reports previous alcohol use. She reports that she does not use drugs.  Allergies:  Allergies  Allergen Reactions  . Amoxicillin Diarrhea  . Iodine Other (See Comments)    Betadine soap causes her to want to pass out  . Penicillin G Diarrhea    (Not in a hospital admission)   Results for orders placed or performed during the hospital encounter of 12/08/19 (from the past 48 hour(s))  Glucose, capillary     Status: Abnormal   Collection Time: 12/08/19  8:16 AM  Result Value Ref Range   Glucose-Capillary 114 (H) 70 - 99 mg/dL    Comment: Glucose reference range applies only to samples taken after fasting for at least 8  hours.   No results found.  Review of Systems  Constitutional: Negative.   HENT: Negative.   Eyes: Negative.   Respiratory: Negative.   Cardiovascular: Negative.   Gastrointestinal: Negative.   Musculoskeletal: Negative.   Skin: Negative.   Neurological: Negative.   Psychiatric/Behavioral: Negative.     Blood pressure 125/82, pulse 60, temperature 98.2 F (36.8 C), temperature source Oral, resp. rate 18, height 5' (1.524 m), weight 75.3 kg, SpO2 96 %. Physical Exam Vitals and nursing note reviewed.  Constitutional:      Appearance: She is well-developed.  HENT:     Head: Normocephalic and atraumatic.  Eyes:     Conjunctiva/sclera: Conjunctivae normal.     Pupils: Pupils are equal, round, and reactive to light.  Cardiovascular:     Heart sounds: Normal heart sounds.  Pulmonary:     Effort: Pulmonary effort is normal.  Abdominal:     Palpations: Abdomen is soft.  Musculoskeletal:        General: Normal range of motion.     Cervical back: Normal range of motion.  Skin:    General: Skin is warm and dry.  Neurological:     General: No focal deficit present.     Mental Status: She is alert.  Psychiatric:        Attention  and Perception: Attention normal.        Mood and Affect: Affect is blunt.        Thought Content: Thought content does not include suicidal ideation.      Assessment/Plan Continue index treatment  Alethia Berthold, MD 12/08/2019, 9:46 AM

## 2019-12-08 NOTE — Anesthesia Preprocedure Evaluation (Signed)
Anesthesia Evaluation  Patient identified by MRN, date of birth, ID band Patient awake    Reviewed: Allergy & Precautions, H&P , NPO status , Patient's Chart, lab work & pertinent test results, reviewed documented beta blocker date and time   Airway Mallampati: II   Neck ROM: full    Dental  (+) Teeth Intact   Pulmonary asthma , sleep apnea ,    Pulmonary exam normal        Cardiovascular Exercise Tolerance: Good hypertension, On Medications negative cardio ROS Normal cardiovascular exam Rhythm:regular Rate:Normal     Neuro/Psych PSYCHIATRIC DISORDERS Anxiety Bipolar Disorder  Neuromuscular disease negative psych ROS   GI/Hepatic Neg liver ROS, GERD  Medicated,  Endo/Other  diabetesHypothyroidism   Renal/GU Renal disease  negative genitourinary   Musculoskeletal  (+) Arthritis ,   Abdominal   Peds  Hematology  (+) Blood dyscrasia, anemia ,   Anesthesia Other Findings Past Medical History: No date: Anxiety No date: Arthritis of knee, left No date: Asthma No date: Bipolar 1 disorder, mixed (HCC) No date: Carpal tunnel syndrome     Comment:  bilateral BILATERAL: Cataract immature MONITORED BY DR GOLDSBOROUGH: Chronic kidney disease (CKD), stage III  (moderate) No date: Diabetes mellitus without complication (HCC)     Comment:  no meds.  diet control borderline-  monitored: Elevated blood pressure (not hypertension) No date: Hyperlipidemia No date: Hypertension No date: Hypothyroidism hearing aid- bilateral: Impaired hearing No date: Nocturia No date: OSA on CPAP No date: Seasonal allergies No date: Stress incontinence, female No date: Urgency of urination Past Surgical History: 05/07/2011: BLADDER SUSPENSION     Comment:  Procedure: [facility] PROCEDURE;  Surgeon: Reece Packer, MD;  Location: [place];                Service: Urology;  Laterality: N/A;  cysto, sparc  sling  27 YRS AGO: DX LAPAROSCOPY FOR INFERTILITY 20 YRS AGO: TUBAL LIGATION   Reproductive/Obstetrics negative OB ROS                             Anesthesia Physical  Anesthesia Plan  ASA: III  Anesthesia Plan: General   Post-op Pain Management:    Induction: Intravenous  PONV Risk Score and Plan:   Airway Management Planned: Mask  Additional Equipment:   Intra-op Plan:   Post-operative Plan:   Informed Consent: I have reviewed the patients History and Physical, chart, labs and discussed the procedure including the risks, benefits and alternatives for the proposed anesthesia with the patient or authorized representative who has indicated his/her understanding and acceptance.     Dental Advisory Given  Plan Discussed with: CRNA  Anesthesia Plan Comments: (Patient consented for risks of anesthesia including but not limited to:  - adverse reactions to medications - risk of intubation if required - damage to eyes, teeth, lips or other oral mucosa - nerve damage due to positioning  - sore throat or hoarseness - Damage to heart, brain, nerves, lungs, other parts of body or loss of life  Patient voiced understanding.)        Anesthesia Quick Evaluation

## 2019-12-10 NOTE — Anesthesia Postprocedure Evaluation (Signed)
Anesthesia Post Note  Patient: [name], applications  Procedure(s) Performed: ECT TX  Patient location during evaluation: PACU Anesthesia Type: General Level of consciousness: awake and alert Pain management: pain level controlled Vital Signs Assessment: post-procedure vital signs reviewed and stable Respiratory status: spontaneous breathing, nonlabored ventilation, respiratory function stable and patient connected to nasal cannula oxygen Cardiovascular status: blood pressure returned to baseline and stable Postop Assessment: no apparent nausea or vomiting Anesthetic complications: no   No complications documented.   Last Vitals:  Vitals:   12/08/19 1141 12/08/19 1151  BP: 137/81 124/81  Pulse: 61 (!) 58  Resp: 10 (!) 8  Temp: (!) 36.2 C   SpO2: 96% 98%    Last Pain:  Vitals:   12/08/19 1141  TempSrc:   PainSc: 0-No pain                 Precious Haws Jahmir Salo

## 2019-12-11 ENCOUNTER — Ambulatory Visit: Payer: Self-pay | Admitting: Anesthesiology

## 2019-12-11 ENCOUNTER — Encounter (HOSPITAL_BASED_OUTPATIENT_CLINIC_OR_DEPARTMENT_OTHER)
Admission: RE | Admit: 2019-12-11 | Discharge: 2019-12-11 | Disposition: A | Discharge status: Home / Self Care | Payer: Medicare Other | Source: Ambulatory Visit | Attending: Psychiatry | Admitting: Psychiatry

## 2019-12-11 ENCOUNTER — Other Ambulatory Visit
Admission: RE | Admit: 2019-12-11 | Discharge: 2019-12-11 | Disposition: A | Discharge status: Home / Self Care | Payer: Medicare Other | Source: Ambulatory Visit | Attending: Psychiatry | Admitting: Psychiatry

## 2019-12-11 ENCOUNTER — Other Ambulatory Visit: Payer: Self-pay

## 2019-12-11 ENCOUNTER — Encounter: Payer: Self-pay | Admitting: Anesthesiology

## 2019-12-11 DIAGNOSIS — F332 Major depressive disorder, recurrent severe without psychotic features: Secondary | ICD-10-CM | POA: Diagnosis not present

## 2019-12-11 DIAGNOSIS — F329 Major depressive disorder, single episode, unspecified: Secondary | ICD-10-CM | POA: Diagnosis not present

## 2019-12-11 DIAGNOSIS — Z01812 Encounter for preprocedural laboratory examination: Secondary | ICD-10-CM | POA: Diagnosis present

## 2019-12-11 DIAGNOSIS — Z20822 Contact with and (suspected) exposure to covid-19: Secondary | ICD-10-CM | POA: Insufficient documentation

## 2019-12-11 MED ORDER — KETAMINE HCL 10 MG/ML IJ SOLN
INTRAMUSCULAR | Status: DC | PRN
  Administered 2019-12-11: 80 mg via INTRAVENOUS

## 2019-12-11 MED ORDER — SUCCINYLCHOLINE CHLORIDE 20 MG/ML IJ SOLN
INTRAMUSCULAR | Status: DC | PRN
  Administered 2019-12-11: 80 mg via INTRAVENOUS

## 2019-12-11 MED ORDER — LABETALOL HCL 5 MG/ML IV SOLN
INTRAVENOUS | Status: DC | PRN
  Administered 2019-12-11: 10 mg via INTRAVENOUS

## 2019-12-11 MED ORDER — SODIUM CHLORIDE 0.9 % IV SOLN: 500.0000 mL | Freq: Once | INTRAVENOUS | Status: DC

## 2019-12-11 NOTE — Anesthesia Postprocedure Evaluation (Signed)
Anesthesia Post Note  Patient: [name], applications  Procedure(s) Performed: ECT TX  Patient location during evaluation: PACU Anesthesia Type: General Level of consciousness: awake and alert Pain management: pain level controlled Vital Signs Assessment: post-procedure vital signs reviewed and stable Respiratory status: spontaneous breathing, nonlabored ventilation and respiratory function stable Cardiovascular status: blood pressure returned to baseline and stable Postop Assessment: no apparent nausea or vomiting Anesthetic complications: no   No complications documented.   Last Vitals:  Vitals:   12/11/19 1110 12/11/19 1140  BP: 131/75 110/72  Pulse: 60 (!) 57  Resp: (!) 8 13  Temp:  (!) 36.3 C  SpO2: 100% 93%    Last Pain:  Vitals:   12/11/19 1140  TempSrc:   PainSc: 0-No pain                 Brett Canales Saquan Furtick

## 2019-12-11 NOTE — Anesthesia Preprocedure Evaluation (Signed)
Anesthesia Evaluation  Patient identified by MRN, date of birth, ID band Patient awake    Reviewed: Allergy & Precautions, H&P , NPO status , Patient's Chart, lab work & pertinent test results  History of Anesthesia Complications Negative for: history of anesthetic complications  Airway Mallampati: III       Dental  (+) Poor Dentition, Chipped   Pulmonary asthma , sleep apnea and Continuous Positive Airway Pressure Ventilation , neg COPD,           Cardiovascular hypertension, (-) angina(-) Past MI and (-) Cardiac Stents (-) dysrhythmias      Neuro/Psych PSYCHIATRIC DISORDERS Anxiety Bipolar Disorder Diabetic neuropathy negative neurological ROS     GI/Hepatic Neg liver ROS, GERD  ,  Endo/Other  diabetesHypothyroidism   Renal/GU Renal disease (CKD)  negative genitourinary   Musculoskeletal   Abdominal   Peds  Hematology negative hematology ROS (+)   Anesthesia Other Findings Past Medical History: No date: Anxiety No date: Arthritis of knee, left No date: Asthma No date: Bipolar 1 disorder, mixed (HCC) No date: Carpal tunnel syndrome     Comment:  bilateral BILATERAL: Cataract immature MONITORED BY DR GOLDSBOROUGH: Chronic kidney disease (CKD), stage III  (moderate) No date: Diabetes mellitus without complication (HCC)     Comment:  no meds.  diet control borderline-  monitored: Elevated blood pressure (not hypertension) No date: Hyperlipidemia No date: Hypertension No date: Hypothyroidism hearing aid- bilateral: Impaired hearing No date: Nocturia No date: OSA on CPAP No date: Seasonal allergies No date: Stress incontinence, female No date: Urgency of urination  Past Surgical History: 05/07/2011: BLADDER SUSPENSION     Comment:  Procedure: [facility] PROCEDURE;  Surgeon: Reece Packer, MD;  Location: [place];                Service: Urology;  Laterality: N/A;  cysto,  sparc sling  27 YRS AGO: DX LAPAROSCOPY FOR INFERTILITY 20 YRS AGO: TUBAL LIGATION  BMI    Body Mass Index: 32.42 kg/m      Reproductive/Obstetrics negative OB ROS                             Anesthesia Physical Anesthesia Plan  ASA: III  Anesthesia Plan: General   Post-op Pain Management:    Induction:   PONV Risk Score and Plan:   Airway Management Planned: Mask  Additional Equipment:   Intra-op Plan:   Post-operative Plan:   Informed Consent: I have reviewed the patients History and Physical, chart, labs and discussed the procedure including the risks, benefits and alternatives for the proposed anesthesia with the patient or authorized representative who has indicated his/her understanding and acceptance.     Dental Advisory Given  Plan Discussed with: Anesthesiologist, CRNA and Surgeon  Anesthesia Plan Comments:         Anesthesia Quick Evaluation

## 2019-12-11 NOTE — Procedures (Signed)
ECT SERVICES Physician's Interval Evaluation & Treatment Note  Patient Identification: Michael Ventresca MRN:  [id number] Date of Evaluation:  12/11/2019 TX #: 4  MADRS:   MMSE:   P.E. Findings:  No change to physical exam  Psychiatric Interval Note:  Patient states she is not feeling any different.  Overall affect looks slightly better.  She is pleasant and makes appropriate jokes and banter with staff.  Subjective:  [name] seen for evaluation for Electroconvulsive Therapy. Patient does not feel any different but also does not notice side effects  Treatment Summary:   [x]   Right Unilateral             []  Bilateral   % Energy : 0.3 ms 100%   Impedance: 1480 ohms  Seizure Energy Index: 87,919 V squared although I am wondering if that may be artifactual  Postictal Suppression Index: 89%  Seizure Concordance Index: 30%  Medications  Pre Shock: Ketamine 80 mg succinylcholine 80 mg  Post Shock:    Seizure Duration: 14 seconds EMG 15 seconds EEG   Comments: Before treatment today I ask her about her Xanax use and she tells me she has stopped using it entirely.  Despite this she is still obviously having short and potentially inadequate seizures.  Given that we are already using ketamine and there is little I can do to try and improve that right now.  I supported her decision to stop the Xanax altogether.  We will see her back on Wednesday and hope that we can get an effective seizure.  Lungs:  [x]   Clear to auscultation               []  Other:   Heart:    [x]   Regular rhythm             []  irregular rhythm    [x]   Previous H&P reviewed, patient examined and there are NO CHANGES                 []   Previous H&P reviewed, patient examined and there are changes noted.   Alethia Berthold, MD 9/20/202112:58 PM

## 2019-12-11 NOTE — H&P (Signed)
Denise Serrano is an 64 y.o. female.   Chief Complaint: No specific complaint HPI: Recurrent depression.  No improvement at this point  Past Medical History:  Diagnosis Date  . Anxiety   . Arthritis of knee, left   . Asthma   . Bipolar 1 disorder, mixed ([place])   . Carpal tunnel syndrome    bilateral  . Cataract immature BILATERAL  . Chronic kidney disease (CKD), stage III (moderate) MONITORED BY DR GOLDSBOROUGH  . Diabetes mellitus without complication (HCC)    no meds.  diet control  . Elevated blood pressure (not hypertension) borderline-  monitored  . Hyperlipidemia   . Hypertension   . Hypothyroidism   . Impaired hearing hearing aid- bilateral  . Nocturia   . OSA on CPAP   . Seasonal allergies   . Stress incontinence, female   . Urgency of urination     Past Surgical History:  Procedure Laterality Date  . BLADDER SUSPENSION  05/07/2011   Procedure: [facility] PROCEDURE;  Surgeon: Reece Packer, MD;  Location: [facility];  Service: Urology;  Laterality: N/A;  cysto, sparc sling   . DX LAPAROSCOPY FOR INFERTILITY  27 YRS AGO  . TUBAL LIGATION  20 YRS AGO    Family History  Problem Relation Age of Onset  . Heart attack Mother   . Heart attack Father    Social History:  reports that she has never smoked. She has never used smokeless tobacco. She reports previous alcohol use. She reports that she does not use drugs.  Allergies:  Allergies  Allergen Reactions  . Amoxicillin Diarrhea  . Iodine Other (See Comments)    Betadine soap causes her to want to pass out  . Penicillin G Diarrhea    (Not in a hospital admission)   No results found for this or any previous visit (from the past 48 hour(s)). No results found.  Review of Systems  Constitutional: Negative.   HENT: Negative.   Eyes: Negative.   Respiratory: Negative.   Cardiovascular: Negative.   Gastrointestinal: Negative.   Musculoskeletal: Negative.   Skin: Negative.   Neurological:  Negative.   Psychiatric/Behavioral: Negative.     Blood pressure 122/72, pulse (!) 58, temperature (!) 97.2 F (36.2 C), temperature source Oral, resp. rate 15, height 5' (1.524 m), weight 75.3 kg, SpO2 98 %. Physical Exam Vitals and nursing note reviewed.  Constitutional:      Appearance: She is well-developed.  HENT:     Head: Normocephalic and atraumatic.  Eyes:     Conjunctiva/sclera: Conjunctivae normal.     Pupils: Pupils are equal, round, and reactive to light.  Cardiovascular:     Heart sounds: Normal heart sounds.  Pulmonary:     Effort: Pulmonary effort is normal.  Abdominal:     Palpations: Abdomen is soft.  Musculoskeletal:        General: Normal range of motion.     Cervical back: Normal range of motion.  Skin:    General: Skin is warm and dry.  Neurological:     General: No focal deficit present.     Mental Status: She is alert.  Psychiatric:        Mood and Affect: Mood normal.      Assessment/Plan Continue index treatment  Alethia Berthold, MD 12/11/2019, 9:20 AM

## 2019-12-11 NOTE — Transfer of Care (Signed)
Immediate Anesthesia Transfer of Care Note  Patient: Denise Serrano  Procedure(s) Performed: ECT TX  Patient Location: PACU  Anesthesia Type:General  Level of Consciousness: sedated  Airway & Oxygen Therapy: Patient Spontanous Breathing and Patient connected to face mask oxygen  Post-op Assessment: Report given to RN and Post -op Vital signs reviewed and stable  Post vital signs: Reviewed  Last Vitals:  Vitals Value Taken Time  BP    Temp    Pulse    Resp    SpO2      Last Pain:  Vitals:   12/11/19 0902  TempSrc:   PainSc: 0-No pain         Complications: No complications documented.

## 2019-12-12 ENCOUNTER — Other Ambulatory Visit: Payer: Self-pay | Admitting: Psychiatry

## 2019-12-12 ENCOUNTER — Telehealth: Payer: Self-pay | Admitting: Psychiatry

## 2019-12-12 LAB — SARS CORONAVIRUS 2 (TAT 6-24 HRS): SARS Coronavirus 2: NEGATIVE

## 2019-12-13 ENCOUNTER — Encounter (HOSPITAL_BASED_OUTPATIENT_CLINIC_OR_DEPARTMENT_OTHER)
Admission: RE | Admit: 2019-12-13 | Discharge: 2019-12-13 | Disposition: A | Discharge status: Home / Self Care | Payer: Medicare Other | Source: Ambulatory Visit | Attending: Psychiatry | Admitting: Psychiatry

## 2019-12-13 ENCOUNTER — Encounter: Payer: Self-pay | Admitting: Certified Registered Nurse Anesthetist

## 2019-12-13 ENCOUNTER — Other Ambulatory Visit: Payer: Self-pay

## 2019-12-13 DIAGNOSIS — F332 Major depressive disorder, recurrent severe without psychotic features: Secondary | ICD-10-CM

## 2019-12-13 DIAGNOSIS — F329 Major depressive disorder, single episode, unspecified: Secondary | ICD-10-CM | POA: Diagnosis not present

## 2019-12-13 LAB — GLUCOSE, CAPILLARY: Glucose-Capillary: 121 mg/dL — ABNORMAL HIGH (ref 70–99)

## 2019-12-13 MED ORDER — KETAMINE HCL 10 MG/ML IJ SOLN
INTRAMUSCULAR | Status: DC | PRN
  Administered 2019-12-13: 80 mg via INTRAVENOUS

## 2019-12-13 MED ORDER — KETAMINE HCL 50 MG/ML IJ SOLN
INTRAMUSCULAR | Status: AC
  Filled 2019-12-13: qty 10

## 2019-12-13 MED ORDER — FENTANYL CITRATE (PF) 100 MCG/2ML IJ SOLN: 25.0000 ug | INTRAMUSCULAR | Status: DC | PRN

## 2019-12-13 MED ORDER — LABETALOL HCL 5 MG/ML IV SOLN
INTRAVENOUS | Status: AC
  Filled 2019-12-13: qty 4

## 2019-12-13 MED ORDER — SUCCINYLCHOLINE CHLORIDE 200 MG/10ML IV SOSY
PREFILLED_SYRINGE | INTRAVENOUS | Status: AC
  Filled 2019-12-13: qty 10

## 2019-12-13 MED ORDER — ONDANSETRON HCL 4 MG/2ML IJ SOLN: 4.0000 mg | Freq: Once | INTRAMUSCULAR | Status: DC | PRN

## 2019-12-13 MED ORDER — SODIUM CHLORIDE 0.9 % IV SOLN: 500.0000 mL | Freq: Once | INTRAVENOUS | Status: AC

## 2019-12-13 MED ORDER — LABETALOL HCL 5 MG/ML IV SOLN
INTRAVENOUS | Status: DC | PRN
  Administered 2019-12-13: 10 mg via INTRAVENOUS

## 2019-12-13 MED ORDER — SUCCINYLCHOLINE CHLORIDE 200 MG/10ML IV SOSY
PREFILLED_SYRINGE | INTRAVENOUS | Status: DC | PRN
  Administered 2019-12-13: 80 mg via INTRAVENOUS

## 2019-12-13 NOTE — Transfer of Care (Signed)
Immediate Anesthesia Transfer of Care Note  Patient: Denise Serrano  Procedure(s) Performed: ECT TX  Patient Location: PACU  Anesthesia Type:General  Level of Consciousness: drowsy and patient cooperative  Airway & Oxygen Therapy: Patient Spontanous Breathing and Patient connected to face mask oxygen  Post-op Assessment: Report given to RN and Post -op Vital signs reviewed and stable  Post vital signs: Reviewed and stable  Last Vitals:  Vitals Value Taken Time  BP    Temp    Pulse    Resp    SpO2      Last Pain:  Vitals:   12/13/19 0801  TempSrc:   PainSc: 0-No pain         Complications: No complications documented.

## 2019-12-13 NOTE — Procedures (Signed)
ECT SERVICES Physician's Interval Evaluation & Treatment Note  Patient Identification: Denise Serrano MRN:  [id number] Date of Evaluation:  12/13/2019 TX #: 5  MADRS:   MMSE:   P.E. Findings:  Physical exam remains unchanged  Psychiatric Interval Note:  Patient reports she does not see any difference but we all noticed she is smiling more and seems more relaxed  Subjective:  [name] seen for evaluation for Electroconvulsive Therapy. Does not report any side effects  Treatment Summary:   [x]   Right Unilateral             []  Bilateral   % Energy : 0.3 ms 100%   Impedance: 1440 ohms  Seizure Energy Index: 22,458 V squared  Postictal Suppression Index: 80%  Seizure Concordance Index: 83%  Medications  Pre Shock: Ketamine 80 mg succinylcholine 80 mg  Post Shock:    Seizure Duration: EMG 19 seconds EEG 23 seconds   Comments: This was actually probably her best quality seizure since starting.  She is on the schedule again for Friday.  Lungs:  [x]   Clear to auscultation               []  Other:   Heart:    [x]   Regular rhythm             []  irregular rhythm    [x]   Previous H&P reviewed, patient examined and there are NO CHANGES                 []   Previous H&P reviewed, patient examined and there are changes noted.   Alethia Berthold, MD 9/22/20215:05 PM

## 2019-12-13 NOTE — Anesthesia Procedure Notes (Signed)
Procedure Name: General with mask airway Date/Time: 12/13/2019 10:28 AM Performed by: Caryl Asp, CRNA Pre-anesthesia Checklist: Patient identified, Emergency Drugs available, Suction available and Patient being monitored Patient Re-evaluated:Patient Re-evaluated prior to induction Oxygen Delivery Method: Circle system utilized Preoxygenation: Pre-oxygenation with 100% oxygen Induction Type: IV induction Ventilation: Mask ventilation without difficulty and Mask ventilation throughout procedure Airway Equipment and Method: Bite block Placement Confirmation: positive ETCO2 Dental Injury: Teeth and Oropharynx as per pre-operative assessment

## 2019-12-13 NOTE — H&P (Signed)
Denise Serrano is an 64 y.o. female.   Chief Complaint: chronically flat mood HPI: depression  Past Medical History:  Diagnosis Date  . Anxiety   . Arthritis of knee, left   . Asthma   . Bipolar 1 disorder, mixed ([place])   . Carpal tunnel syndrome    bilateral  . Cataract immature BILATERAL  . Chronic kidney disease (CKD), stage III (moderate) MONITORED BY DR GOLDSBOROUGH  . Diabetes mellitus without complication (HCC)    no meds.  diet control  . Elevated blood pressure (not hypertension) borderline-  monitored  . Hyperlipidemia   . Hypertension   . Hypothyroidism   . Impaired hearing hearing aid- bilateral  . Nocturia   . OSA on CPAP   . Seasonal allergies   . Stress incontinence, female   . Urgency of urination     Past Surgical History:  Procedure Laterality Date  . BLADDER SUSPENSION  05/07/2011   Procedure: [facility] PROCEDURE;  Surgeon: Reece Packer, MD;  Location: [facility];  Service: Urology;  Laterality: N/A;  cysto, sparc sling   . DX LAPAROSCOPY FOR INFERTILITY  27 YRS AGO  . TUBAL LIGATION  20 YRS AGO    Family History  Problem Relation Age of Onset  . Heart attack Mother   . Heart attack Father    Social History:  reports that she has never smoked. She has never used smokeless tobacco. She reports previous alcohol use. She reports that she does not use drugs.  Allergies:  Allergies  Allergen Reactions  . Amoxicillin Diarrhea  . Iodine Other (See Comments)    Betadine soap causes her to want to pass out  . Penicillin G Diarrhea    (Not in a hospital admission)   Results for orders placed or performed during the hospital encounter of 12/13/19 (from the past 48 hour(s))  Glucose, capillary     Status: Abnormal   Collection Time: 12/13/19  8:41 AM  Result Value Ref Range   Glucose-Capillary 121 (H) 70 - 99 mg/dL    Comment: Glucose reference range applies only to samples taken after fasting for at least 8 hours.   No results  found.  Review of Systems  Constitutional: Negative.   HENT: Negative.   Eyes: Negative.   Respiratory: Negative.   Cardiovascular: Negative.   Gastrointestinal: Negative.   Musculoskeletal: Negative.   Skin: Negative.   Neurological: Negative.   Psychiatric/Behavioral: Positive for dysphoric mood. Negative for hallucinations, self-injury, sleep disturbance and suicidal ideas. The patient is not nervous/anxious and is not hyperactive.     Blood pressure 137/89, pulse 62, temperature 97.6 F (36.4 C), temperature source Oral, resp. rate 16, height 5' (1.524 m), weight 75.3 kg, SpO2 95 %. Physical Exam Vitals and nursing note reviewed.  Constitutional:      Appearance: She is well-developed.  HENT:     Head: Normocephalic and atraumatic.  Eyes:     Conjunctiva/sclera: Conjunctivae normal.     Pupils: Pupils are equal, round, and reactive to light.  Cardiovascular:     Heart sounds: Normal heart sounds.  Pulmonary:     Effort: Pulmonary effort is normal.  Abdominal:     Palpations: Abdomen is soft.  Musculoskeletal:        General: Normal range of motion.     Cervical back: Normal range of motion.  Skin:    General: Skin is warm and dry.  Neurological:     General: No focal deficit present.  Mental Status: She is alert.  Psychiatric:        Mood and Affect: Mood normal.      Assessment/Plan comtinue index treatment  Alethia Berthold, MD 12/13/2019, 9:53 AM

## 2019-12-13 NOTE — Anesthesia Preprocedure Evaluation (Signed)
Anesthesia Evaluation  Patient identified by MRN, date of birth, ID band Patient awake    Reviewed: Allergy & Precautions, H&P , NPO status , Patient's Chart, lab work & pertinent test results, reviewed documented beta blocker date and time   Airway Mallampati: II   Neck ROM: full    Dental  (+) Poor Dentition   Pulmonary neg pulmonary ROS, asthma , sleep apnea ,    Pulmonary exam normal        Cardiovascular Exercise Tolerance: Poor hypertension, negative cardio ROS Normal cardiovascular exam Rhythm:regular Rate:Normal     Neuro/Psych Anxiety Bipolar Disorder  Neuromuscular disease negative neurological ROS  negative psych ROS   GI/Hepatic negative GI ROS, Neg liver ROS, GERD  Medicated,  Endo/Other  negative endocrine ROSdiabetesHypothyroidism   Renal/GU Renal diseasenegative Renal ROS  negative genitourinary   Musculoskeletal   Abdominal   Peds  Hematology negative hematology ROS (+) Blood dyscrasia, anemia ,   Anesthesia Other Findings Past Medical History: No date: Anxiety No date: Arthritis of knee, left No date: Asthma No date: Bipolar 1 disorder, mixed (HCC) No date: Carpal tunnel syndrome     Comment:  bilateral BILATERAL: Cataract immature MONITORED BY DR GOLDSBOROUGH: Chronic kidney disease (CKD), stage III  (moderate) No date: Diabetes mellitus without complication (HCC)     Comment:  no meds.  diet control borderline-  monitored: Elevated blood pressure (not hypertension) No date: Hyperlipidemia No date: Hypertension No date: Hypothyroidism hearing aid- bilateral: Impaired hearing No date: Nocturia No date: OSA on CPAP No date: Seasonal allergies No date: Stress incontinence, female No date: Urgency of urination Past Surgical History: 05/07/2011: BLADDER SUSPENSION     Comment:  Procedure: [place] PROCEDURE;  Surgeon: Reece Packer, MD;  Location: [place];                Service: Urology;  Laterality: N/A;  cysto, sparc sling  27 YRS AGO: DX LAPAROSCOPY FOR INFERTILITY 20 YRS AGO: TUBAL LIGATION BMI    Body Mass Index: 32.42 kg/m     Reproductive/Obstetrics negative OB ROS                             Anesthesia Physical Anesthesia Plan  ASA: III  Anesthesia Plan: General   Post-op Pain Management:    Induction:   PONV Risk Score and Plan: 3  Airway Management Planned:   Additional Equipment:   Intra-op Plan:   Post-operative Plan:   Informed Consent: I have reviewed the patients History and Physical, chart, labs and discussed the procedure including the risks, benefits and alternatives for the proposed anesthesia with the patient or authorized representative who has indicated his/her understanding and acceptance.     Dental Advisory Given  Plan Discussed with: CRNA  Anesthesia Plan Comments:         Anesthesia Quick Evaluation

## 2019-12-14 ENCOUNTER — Other Ambulatory Visit: Payer: Self-pay | Admitting: Psychiatry

## 2019-12-15 ENCOUNTER — Encounter: Payer: Self-pay | Admitting: Anesthesiology

## 2019-12-15 ENCOUNTER — Ambulatory Visit
Admission: RE | Admit: 2019-12-15 | Discharge: 2019-12-15 | Disposition: A | Discharge status: Home / Self Care | Payer: Medicare Other | Source: Ambulatory Visit | Attending: Psychiatry | Admitting: Psychiatry

## 2019-12-15 ENCOUNTER — Other Ambulatory Visit: Payer: Self-pay

## 2019-12-15 DIAGNOSIS — E1136 Type 2 diabetes mellitus with diabetic cataract: Secondary | ICD-10-CM | POA: Diagnosis not present

## 2019-12-15 DIAGNOSIS — N183 Chronic kidney disease, stage 3 unspecified: Secondary | ICD-10-CM | POA: Insufficient documentation

## 2019-12-15 DIAGNOSIS — F339 Major depressive disorder, recurrent, unspecified: Secondary | ICD-10-CM | POA: Diagnosis not present

## 2019-12-15 DIAGNOSIS — Z888 Allergy status to other drugs, medicaments and biological substances status: Secondary | ICD-10-CM | POA: Insufficient documentation

## 2019-12-15 DIAGNOSIS — Z8249 Family history of ischemic heart disease and other diseases of the circulatory system: Secondary | ICD-10-CM | POA: Insufficient documentation

## 2019-12-15 DIAGNOSIS — M1712 Unilateral primary osteoarthritis, left knee: Secondary | ICD-10-CM | POA: Diagnosis not present

## 2019-12-15 DIAGNOSIS — I129 Hypertensive chronic kidney disease with stage 1 through stage 4 chronic kidney disease, or unspecified chronic kidney disease: Secondary | ICD-10-CM | POA: Insufficient documentation

## 2019-12-15 DIAGNOSIS — F332 Major depressive disorder, recurrent severe without psychotic features: Secondary | ICD-10-CM

## 2019-12-15 DIAGNOSIS — J45909 Unspecified asthma, uncomplicated: Secondary | ICD-10-CM | POA: Diagnosis not present

## 2019-12-15 DIAGNOSIS — G4733 Obstructive sleep apnea (adult) (pediatric): Secondary | ICD-10-CM | POA: Diagnosis not present

## 2019-12-15 DIAGNOSIS — E1122 Type 2 diabetes mellitus with diabetic chronic kidney disease: Secondary | ICD-10-CM | POA: Diagnosis not present

## 2019-12-15 DIAGNOSIS — Z880 Allergy status to penicillin: Secondary | ICD-10-CM | POA: Insufficient documentation

## 2019-12-15 DIAGNOSIS — E785 Hyperlipidemia, unspecified: Secondary | ICD-10-CM | POA: Diagnosis not present

## 2019-12-15 DIAGNOSIS — E039 Hypothyroidism, unspecified: Secondary | ICD-10-CM | POA: Diagnosis not present

## 2019-12-15 DIAGNOSIS — H9193 Unspecified hearing loss, bilateral: Secondary | ICD-10-CM | POA: Diagnosis not present

## 2019-12-15 DIAGNOSIS — Z91041 Radiographic dye allergy status: Secondary | ICD-10-CM | POA: Diagnosis not present

## 2019-12-15 DIAGNOSIS — E1142 Type 2 diabetes mellitus with diabetic polyneuropathy: Secondary | ICD-10-CM | POA: Insufficient documentation

## 2019-12-15 LAB — GLUCOSE, CAPILLARY
Glucose-Capillary: 123 mg/dL — ABNORMAL HIGH (ref 70–99)
Glucose-Capillary: 124 mg/dL — ABNORMAL HIGH (ref 70–99)

## 2019-12-15 MED ORDER — SUCCINYLCHOLINE CHLORIDE 20 MG/ML IJ SOLN
INTRAMUSCULAR | Status: DC | PRN
  Administered 2019-12-15: 80 mg via INTRAVENOUS

## 2019-12-15 MED ORDER — SODIUM CHLORIDE 0.9 % IV SOLN
500.0000 mL | Freq: Once | INTRAVENOUS | Status: AC
  Administered 2019-12-15: 500 mL via INTRAVENOUS

## 2019-12-15 MED ORDER — SODIUM CHLORIDE 0.9 % IV SOLN: INTRAVENOUS | Status: DC | PRN

## 2019-12-15 MED ORDER — LABETALOL HCL 5 MG/ML IV SOLN
INTRAVENOUS | Status: DC | PRN
  Administered 2019-12-15: 10 mg via INTRAVENOUS

## 2019-12-15 MED ORDER — KETAMINE HCL 10 MG/ML IJ SOLN
INTRAMUSCULAR | Status: DC | PRN
  Administered 2019-12-15: 80 mg via INTRAVENOUS

## 2019-12-15 MED ORDER — KETAMINE HCL 50 MG/ML IJ SOLN
INTRAMUSCULAR | Status: AC
  Filled 2019-12-15: qty 10

## 2019-12-15 NOTE — Discharge Instructions (Signed)
1)  The drugs that you have been given will stay in your system until tomorrow so for the       next 24 hours you should not:  A. Drive an automobile  B. Make any legal decisions  C. Drink any alcoholic beverages  2)  You may resume your regular meals upon return home.  3)  A responsible adult must take you home.  Someone should stay with you for a few          hours, then be available by phone for the remainder of the treatment day.  4)  You May experience any of the following symptoms:  Headache, Nausea and a dry mouth (due to the medications you were given),  temporary memory loss and some confusion, or sore muscles (a warm bath  should help this).  If you you experience any of these symptoms let us know on                your return visit.  5)  Report any of the following: any acute discomfort, severe headache, or temperature        greater than 100.5 F.   Also report any unusual redness, swelling, drainage, or pain         at your IV site.    You may report Symptoms to:  [place] [facility]          Phone: [phone], ECT Department           or Dr. Prescott Gum office [phone]  6)  Your next ECT Treatment is Day Friday  Date October 1 at 0830  We will call 2 days prior to your scheduled appointment for arrival times.  7)  Nothing to eat or drink after midnight the night before your procedure.  8)  Take meds as instructed per Dr. Weber Cooks     With a sip of water the morning of your procedure.  9)  Other Instructions: Call [phone] to cancel the morning of your procedure due         to illness or emergency.  10) We will call within 72 hours to assess how you are feeling.

## 2019-12-15 NOTE — Anesthesia Postprocedure Evaluation (Signed)
Anesthesia Post Note  Patient: [name], applications  Procedure(s) Performed: ECT TX  Patient location during evaluation: PACU Anesthesia Type: General Level of consciousness: awake and alert Pain management: pain level controlled Vital Signs Assessment: post-procedure vital signs reviewed and stable Respiratory status: spontaneous breathing, nonlabored ventilation, respiratory function stable and patient connected to nasal cannula oxygen Cardiovascular status: blood pressure returned to baseline and stable Postop Assessment: no apparent nausea or vomiting Anesthetic complications: no   No complications documented.   Last Vitals:  Vitals:   12/13/19 1103 12/13/19 1114  BP: (!) 151/79 (!) 150/75  Pulse:  65  Resp:  15  Temp:  36.6 C  SpO2:      Last Pain:  Vitals:   12/13/19 1114  TempSrc: Oral  PainSc: 0-No pain                 Molli Barrows

## 2019-12-15 NOTE — Procedures (Signed)
ECT SERVICES Physician's Interval Evaluation & Treatment Note  Patient Identification: Denise Serrano MRN:  [id number] Date of Evaluation:  12/15/2019 TX #: 6  MADRS:   MMSE:   P.E. Findings:  No change to physical exam  Psychiatric Interval Note:  Affect brighter although no sign of pressured speech disorganized thinking or any actual signs of mania.  Subjective:  [name] seen for evaluation for Electroconvulsive Therapy. Denies suicidal ideation.  Reports mood and energy level are improved  Treatment Summary:   [x]   Right Unilateral             []  Bilateral   % Energy : 0.3 ms 100%   Impedance: 1260 ohms  Seizure Energy Index: 2288 V squared  Postictal Suppression Index: No reading  Seizure Concordance Index: 97%  Medications  Pre Shock: Ketamine 80 mg succinylcholine 80 mg  Post Shock:    Seizure Duration: 19 seconds EMG 21 seconds EEG   Comments: Adequate quality seizure.  No complications  Lungs:  [x]   Clear to auscultation               []  Other:   Heart:    [x]   Regular rhythm             []  irregular rhythm    [x]   Previous H&P reviewed, patient examined and there are NO CHANGES                 []   Previous H&P reviewed, patient examined and there are changes noted.   Denise Berthold, MD 9/24/20214:36 PM

## 2019-12-15 NOTE — Transfer of Care (Signed)
Immediate Anesthesia Transfer of Care Note  Patient: Denise Serrano  Procedure(s) Performed: ECT TX  Patient Location: PACU  Anesthesia Type:General  Level of Consciousness: sedated  Airway & Oxygen Therapy: Patient Spontanous Breathing  Post-op Assessment: Report given to RN and Post -op Vital signs reviewed and stable  Post vital signs: Reviewed and stable  Last Vitals:  Vitals Value Taken Time  BP 165/84 12/15/19 1048  Temp 36.1 C 12/15/19 1047  Pulse 66 12/15/19 1048  Resp 8 12/15/19 1048  SpO2 99 % 12/15/19 1048  Vitals shown include unvalidated device data.  Last Pain:  Vitals:   12/15/19 1048  TempSrc:   PainSc: 0-No pain         Complications: No complications documented.

## 2019-12-15 NOTE — H&P (Signed)
Denise Serrano is an 64 y.o. female.   Chief Complaint: Patient reports she is feeling much better.  Energy has improved.  She has been getting up during the day rather than staying in bed.  She does not report any psychosis does not report any dangerous behavior does not report any aggression. HPI: History of recurrent severe depression  Past Medical History:  Diagnosis Date  . Anxiety   . Arthritis of knee, left   . Asthma   . Bipolar 1 disorder, mixed ([place])   . Carpal tunnel syndrome    bilateral  . Cataract immature BILATERAL  . Chronic kidney disease (CKD), stage III (moderate) MONITORED BY DR GOLDSBOROUGH  . Diabetes mellitus without complication (HCC)    no meds.  diet control  . Elevated blood pressure (not hypertension) borderline-  monitored  . Hyperlipidemia   . Hypertension   . Hypothyroidism   . Impaired hearing hearing aid- bilateral  . Nocturia   . OSA on CPAP   . Seasonal allergies   . Stress incontinence, female   . Urgency of urination     Past Surgical History:  Procedure Laterality Date  . BLADDER SUSPENSION  05/07/2011   Procedure: [facility] PROCEDURE;  Surgeon: Reece Packer, MD;  Location: [facility];  Service: Urology;  Laterality: N/A;  cysto, sparc sling   . DX LAPAROSCOPY FOR INFERTILITY  27 YRS AGO  . TUBAL LIGATION  20 YRS AGO    Family History  Problem Relation Age of Onset  . Heart attack Mother   . Heart attack Father    Social History:  reports that she has never smoked. She has never used smokeless tobacco. She reports previous alcohol use. She reports that she does not use drugs.  Allergies:  Allergies  Allergen Reactions  . Amoxicillin Diarrhea  . Iodine Other (See Comments)    Betadine soap causes her to want to pass out  . Penicillin G Diarrhea    (Not in a hospital admission)   Results for orders placed or performed during the hospital encounter of 12/15/19 (from the past 48 hour(s))  Glucose, capillary      Status: Abnormal   Collection Time: 12/15/19  8:35 AM  Result Value Ref Range   Glucose-Capillary 123 (H) 70 - 99 mg/dL    Comment: Glucose reference range applies only to samples taken after fasting for at least 8 hours.  Glucose, capillary     Status: Abnormal   Collection Time: 12/15/19 11:00 AM  Result Value Ref Range   Glucose-Capillary 124 (H) 70 - 99 mg/dL    Comment: Glucose reference range applies only to samples taken after fasting for at least 8 hours.   Comment 1 Document in Chart    No results found.  Review of Systems  Constitutional: Negative.   HENT: Negative.   Eyes: Negative.   Respiratory: Negative.   Cardiovascular: Negative.   Gastrointestinal: Negative.   Musculoskeletal: Negative.   Skin: Negative.   Neurological: Negative.   Psychiatric/Behavioral: Negative.     Blood pressure (!) 150/74, pulse 63, temperature (!) 97 F (36.1 C), resp. rate 18, height 5' (1.524 m), weight 75.3 kg, SpO2 94 %. Physical Exam Vitals and nursing note reviewed.  Constitutional:      Appearance: She is well-developed.  HENT:     Head: Normocephalic and atraumatic.  Eyes:     Conjunctiva/sclera: Conjunctivae normal.     Pupils: Pupils are equal, round, and reactive to light.  Cardiovascular:     Heart sounds: Normal heart sounds.  Pulmonary:     Effort: Pulmonary effort is normal.  Abdominal:     Palpations: Abdomen is soft.  Musculoskeletal:        General: Normal range of motion.     Cervical back: Normal range of motion.  Skin:    General: Skin is warm and dry.  Neurological:     General: No focal deficit present.     Mental Status: She is alert.  Psychiatric:        Mood and Affect: Mood normal.        Behavior: Behavior normal.        Thought Content: Thought content normal.        Judgment: Judgment normal.      Assessment/Plan Patient appears to shown some improvement finally.  Treatment #6 today.  We will follow-up on 1 week from today for possible  maintenance.  Alethia Berthold, MD 12/15/2019, 4:34 PM

## 2019-12-15 NOTE — Anesthesia Procedure Notes (Addendum)
Date/Time: 12/15/2019 10:35 AM Performed by: Hedda Slade, CRNA Pre-anesthesia Checklist: Patient identified, Emergency Drugs available, Suction available and Patient being monitored Patient Re-evaluated:Patient Re-evaluated prior to induction Oxygen Delivery Method: Circle system utilized Preoxygenation: Pre-oxygenation with 100% oxygen Induction Type: IV induction Ventilation: Mask ventilation without difficulty and Mask ventilation throughout procedure Airway Equipment and Method: Bite block Placement Confirmation: positive ETCO2 Dental Injury: Teeth and Oropharynx as per pre-operative assessment

## 2019-12-15 NOTE — Anesthesia Preprocedure Evaluation (Signed)
Anesthesia Evaluation  Patient identified by MRN, date of birth, ID band Patient awake    Reviewed: Allergy & Precautions, H&P , NPO status , Patient's Chart, lab work & pertinent test results, reviewed documented beta blocker date and time   Airway Mallampati: II   Neck ROM: full    Dental  (+) Teeth Intact   Pulmonary asthma , sleep apnea ,    Pulmonary exam normal        Cardiovascular Exercise Tolerance: Good hypertension, On Medications negative cardio ROS Normal cardiovascular exam Rhythm:regular Rate:Normal     Neuro/Psych PSYCHIATRIC DISORDERS Anxiety Bipolar Disorder  Neuromuscular disease negative psych ROS   GI/Hepatic Neg liver ROS, GERD  Medicated,  Endo/Other  diabetesHypothyroidism   Renal/GU Renal disease  negative genitourinary   Musculoskeletal  (+) Arthritis ,   Abdominal   Peds  Hematology  (+) Blood dyscrasia, anemia ,   Anesthesia Other Findings Past Medical History: No date: Anxiety No date: Arthritis of knee, left No date: Asthma No date: Bipolar 1 disorder, mixed (HCC) No date: Carpal tunnel syndrome     Comment:  bilateral BILATERAL: Cataract immature MONITORED BY DR GOLDSBOROUGH: Chronic kidney disease (CKD), stage III  (moderate) No date: Diabetes mellitus without complication (HCC)     Comment:  no meds.  diet control borderline-  monitored: Elevated blood pressure (not hypertension) No date: Hyperlipidemia No date: Hypertension No date: Hypothyroidism hearing aid- bilateral: Impaired hearing No date: Nocturia No date: OSA on CPAP No date: Seasonal allergies No date: Stress incontinence, female No date: Urgency of urination Past Surgical History: 05/07/2011: BLADDER SUSPENSION     Comment:  Procedure: [place] PROCEDURE;  Surgeon: Reece Packer, MD;  Location: [place];                Service: Urology;  Laterality: N/A;  cysto, sparc  sling  27 YRS AGO: DX LAPAROSCOPY FOR INFERTILITY 20 YRS AGO: TUBAL LIGATION   Reproductive/Obstetrics negative OB ROS                             Anesthesia Physical  Anesthesia Plan  ASA: III  Anesthesia Plan: General   Post-op Pain Management:    Induction: Intravenous  PONV Risk Score and Plan:   Airway Management Planned: Mask  Additional Equipment:   Intra-op Plan:   Post-operative Plan:   Informed Consent: I have reviewed the patients History and Physical, chart, labs and discussed the procedure including the risks, benefits and alternatives for the proposed anesthesia with the patient or authorized representative who has indicated his/her understanding and acceptance.     Dental Advisory Given  Plan Discussed with: CRNA  Anesthesia Plan Comments: (Patient consented for risks of anesthesia including but not limited to:  - adverse reactions to medications - risk of intubation if required - damage to eyes, teeth, lips or other oral mucosa - nerve damage due to positioning  - sore throat or hoarseness - Damage to heart, brain, nerves, lungs, other parts of body or loss of life  Patient voiced understanding.)        Anesthesia Quick Evaluation

## 2019-12-16 NOTE — Anesthesia Postprocedure Evaluation (Signed)
Anesthesia Post Note  Patient: [name], applications  Procedure(s) Performed: ECT TX  Patient location during evaluation: PACU Anesthesia Type: General Level of consciousness: awake and alert Pain management: pain level controlled Vital Signs Assessment: post-procedure vital signs reviewed and stable Respiratory status: spontaneous breathing, nonlabored ventilation, respiratory function stable and patient connected to nasal cannula oxygen Cardiovascular status: blood pressure returned to baseline and stable Postop Assessment: no apparent nausea or vomiting Anesthetic complications: no   No complications documented.   Last Vitals:  Vitals:   12/15/19 1117 12/15/19 1128  BP: (!) 149/81 (!) 150/74  Pulse: 67 63  Resp: 19 18  Temp: (!) 36.1 C   SpO2: 94%     Last Pain:  Vitals:   12/15/19 1128  TempSrc:   PainSc: 0-No pain                 Martha Clan

## 2019-12-20 ENCOUNTER — Telehealth: Payer: Self-pay

## 2019-12-21 ENCOUNTER — Other Ambulatory Visit: Payer: Self-pay | Admitting: Psychiatry

## 2019-12-22 ENCOUNTER — Inpatient Hospital Stay: Admission: RE | Admit: 2019-12-22 | Payer: Medicare Other | Source: Ambulatory Visit

## 2020-09-16 ENCOUNTER — Other Ambulatory Visit: Payer: Self-pay

## 2020-09-16 ENCOUNTER — Telehealth (HOSPITAL_BASED_OUTPATIENT_CLINIC_OR_DEPARTMENT_OTHER): Payer: Medicare Other | Admitting: Psychiatry

## 2020-09-16 DIAGNOSIS — F314 Bipolar disorder, current episode depressed, severe, without psychotic features: Secondary | ICD-10-CM

## 2020-09-16 NOTE — Progress Notes (Signed)
Virtual Visit via Telephone Note  I connected with Aleeza Jillson on 09/16/20 at  2:00 PM EDT by telephone and verified that I am speaking with the correct person using two identifiers.  Location: Patient: Home Provider: Hospital   I discussed the limitations, risks, security and privacy concerns of performing an evaluation and management service by telephone and the availability of in person appointments. I also discussed with the patient that there may be a patient responsible charge related to this service. The patient expressed understanding and agreed to proceed.   History of Present Illness: Consult to speak with this patient about resuming ECT.  Patient was contacted by telephone.  Identified self inpatient.  Identified purpose of conversation.  Patient reports mood has been feeling much worse since the beginning of January.  She says that she was doing okay after ECT until the end of the year but now she is feeling much worse.  Energy is very low.  Sleeps much of the day.  Hardly does anything.  Decreased lack of interest in anything.  She was crying frequently during the conversation.  Denies any suicidal intent or plan and denies any psychotic symptoms.  Has not seen her psychiatrist in months but reports that she is still taking medication the same as what it was previously although she has discontinued the alprazolam on her own.  Denies alcohol or drug abuse.    Observations/Objective: Patient was alert oriented and cooperative with the evaluation.  Affect tearful sounding sad and anxious.  Thoughts appeared to be lucid no evidence of delusional thinking found did not appear to be responding to internal stimuli.  Denies suicidal thought intent or plan or homicidal ideation   Assessment and Plan: Patient responded fairly well to ECT last fall and wants to try again.  We reviewed risks and benefits and side effects of ECT.  Patient is agreeable to restarting.  I told her that the ECT  service would not be operative next week and so the earliest we could start would be January 11.  Information has been passed on to Ms. Ricard Dillon the nurse managing ECT who will get in touch with the patient to arrange for a treatment schedule.   Follow Up Instructions: No change to medications ordered.  Work on possibly starting ECT by July 11    I discussed the assessment and treatment plan with the patient. The patient was provided an opportunity to ask questions and all were answered. The patient agreed with the plan and demonstrated an understanding of the instructions.   The patient was advised to call back or seek an in-person evaluation if the symptoms worsen or if the condition fails to improve as anticipated.  I provided 30 minutes of non-face-to-face time during this encounter.   Alethia Berthold, MD

## 2020-09-30 ENCOUNTER — Encounter: Payer: Self-pay | Admitting: Anesthesiology

## 2020-09-30 ENCOUNTER — Ambulatory Visit: Payer: Self-pay | Admitting: Anesthesiology

## 2020-09-30 ENCOUNTER — Encounter
Admission: RE | Admit: 2020-09-30 | Discharge: 2020-09-30 | Disposition: A | Discharge status: Home / Self Care | Payer: Medicare Other | Source: Ambulatory Visit | Attending: Psychiatry | Admitting: Psychiatry

## 2020-09-30 ENCOUNTER — Other Ambulatory Visit: Payer: Self-pay

## 2020-09-30 ENCOUNTER — Other Ambulatory Visit: Payer: Self-pay | Admitting: Psychiatry

## 2020-09-30 DIAGNOSIS — F332 Major depressive disorder, recurrent severe without psychotic features: Secondary | ICD-10-CM | POA: Diagnosis not present

## 2020-09-30 DIAGNOSIS — R410 Disorientation, unspecified: Secondary | ICD-10-CM | POA: Insufficient documentation

## 2020-09-30 DIAGNOSIS — Z888 Allergy status to other drugs, medicaments and biological substances status: Secondary | ICD-10-CM | POA: Insufficient documentation

## 2020-09-30 DIAGNOSIS — Z880 Allergy status to penicillin: Secondary | ICD-10-CM | POA: Diagnosis not present

## 2020-09-30 DIAGNOSIS — E119 Type 2 diabetes mellitus without complications: Secondary | ICD-10-CM | POA: Insufficient documentation

## 2020-09-30 DIAGNOSIS — F339 Major depressive disorder, recurrent, unspecified: Secondary | ICD-10-CM | POA: Diagnosis present

## 2020-09-30 LAB — GLUCOSE, CAPILLARY: Glucose-Capillary: 142 mg/dL — ABNORMAL HIGH (ref 70–99)

## 2020-09-30 MED ORDER — SUCCINYLCHOLINE CHLORIDE 20 MG/ML IJ SOLN
INTRAMUSCULAR | Status: DC | PRN
  Administered 2020-09-30: 80 mg via INTRAVENOUS

## 2020-09-30 MED ORDER — KETAMINE HCL 10 MG/ML IJ SOLN
INTRAMUSCULAR | Status: DC | PRN
  Administered 2020-09-30: 80 mg via INTRAVENOUS

## 2020-09-30 MED ORDER — KETAMINE HCL 50 MG/5ML IJ SOSY
PREFILLED_SYRINGE | INTRAMUSCULAR | Status: AC
  Filled 2020-09-30: qty 10

## 2020-09-30 MED ORDER — SODIUM CHLORIDE 0.9 % IV SOLN: 500.0000 mL | Freq: Once | INTRAVENOUS | Status: AC

## 2020-09-30 MED ORDER — SUCCINYLCHOLINE CHLORIDE 200 MG/10ML IV SOSY
PREFILLED_SYRINGE | INTRAVENOUS | Status: AC
  Filled 2020-09-30: qty 10

## 2020-09-30 MED ORDER — METHOHEXITAL SODIUM 0.5 G IJ SOLR
INTRAMUSCULAR | Status: AC
  Filled 2020-09-30: qty 500

## 2020-09-30 NOTE — Procedures (Signed)
ECT SERVICES Physician's Interval Evaluation & Treatment Note  Patient Identification: Denise Serrano MRN:  [id number] Date of Evaluation:  09/30/2020 TX #: 1  MADRS:   MMSE:   P.E. Findings:  Normal physical exam.  Hard of hearing.  Heart and lungs normal.  Psychiatric Interval Note:  Lucid no psychosis no suicidal ideation but feeling depressed  Subjective:  Patient is a 65 y.o. female seen for evaluation for Electroconvulsive Therapy. Dysphoric mood  Treatment Summary:   '[x]'$   Right Unilateral             '[]'$  Bilateral   % Energy : 0.3 ms 100%   Impedance: 2000 ohms  Seizure Energy Index: No reading due to poor lead connection  Postictal Suppression Index: See above  Seizure Concordance Index: 47% although I suspect this is incorrect because of poor lead connection  Medications  Pre Shock: Ketamine 80 mg succinylcholine 80 mg  Post Shock:    Seizure Duration: 23 seconds EMG 23 seconds EEG   Comments: Continue as is for now through this week monitoring mood and side effects  Lungs:  '[x]'$   Clear to auscultation               '[]'$  Other:   Heart:    '[x]'$   Regular rhythm             '[]'$  irregular rhythm    '[x]'$   Previous H&P reviewed, patient examined and there are NO CHANGES                 '[]'$   Previous H&P reviewed, patient examined and there are changes noted.   Alethia Berthold, MD 7/11/20226:13 PM

## 2020-09-30 NOTE — H&P (Signed)
Denise Serrano is an 65 y.o. female.   Chief Complaint: Patient with a history of recurrent depression comes back saying mood is starting to get worse again requesting return to ECT treatment HPI: History of recurrent severe depression part of bipolar disorder  Past Medical History:  Diagnosis Date   Anxiety    Arthritis of knee, left    Asthma    Bipolar 1 disorder, mixed (HCC)    Carpal tunnel syndrome    bilateral   Cataract immature BILATERAL   Chronic kidney disease (CKD), stage III (moderate) ([place]) MONITORED BY DR Moshe Cipro   Diabetes mellitus without complication ([place])    no meds.  diet control   Elevated blood pressure (not hypertension) borderline-  monitored   Hyperlipidemia    Hypertension    Hypothyroidism    Impaired hearing hearing aid- bilateral   Nocturia    OSA on CPAP    Seasonal allergies    Stress incontinence, female    Urgency of urination     Past Surgical History:  Procedure Laterality Date   BLADDER SUSPENSION  05/07/2011   Procedure: [facility] PROCEDURE;  Surgeon: Reece Packer, MD;  Location: [place];  Service: Urology;  Laterality: N/A;  cysto, sparc sling    DX LAPAROSCOPY FOR INFERTILITY  27 YRS AGO   TUBAL LIGATION  46 YRS AGO    Family History  Problem Relation Age of Onset   Heart attack Mother    Heart attack Father    Social History:  reports that she has never smoked. She has never used smokeless tobacco. She reports previous alcohol use. She reports that she does not use drugs.  Allergies:  Allergies  Allergen Reactions   Amoxicillin Diarrhea   Iodine Other (See Comments)    Betadine soap causes her to want to pass out   Penicillin G Diarrhea    (Not in a hospital admission)   Results for orders placed or performed during the hospital encounter of 09/30/20 (from the past 48 hour(s))  Glucose, capillary     Status: Abnormal   Collection Time: 09/30/20 10:58 AM  Result Value Ref Range    Glucose-Capillary 142 (H) 70 - 99 mg/dL    Comment: Glucose reference range applies only to samples taken after fasting for at least 8 hours.   No results found.  Review of Systems  Constitutional: Negative.   HENT: Negative.    Eyes: Negative.   Respiratory: Negative.    Cardiovascular: Negative.   Gastrointestinal: Negative.   Musculoskeletal: Negative.   Skin: Negative.   Neurological: Negative.   Psychiatric/Behavioral:  Positive for dysphoric mood. Negative for suicidal ideas.    Blood pressure (!) 158/95, pulse 80, temperature (!) 97.5 F (36.4 C), temperature source Oral, resp. rate 18, height '5\' 3"'$  (1.6 m), weight 86.2 kg, SpO2 93 %. Physical Exam Vitals and nursing note reviewed.  Constitutional:      Appearance: She is well-developed.  HENT:     Head: Normocephalic and atraumatic.  Eyes:     Conjunctiva/sclera: Conjunctivae normal.     Pupils: Pupils are equal, round, and reactive to light.  Cardiovascular:     Heart sounds: Normal heart sounds.  Pulmonary:     Effort: Pulmonary effort is normal.  Abdominal:     Palpations: Abdomen is soft.  Musculoskeletal:        General: Normal range of motion.     Cervical back: Normal range of motion.  Skin:    General:  Skin is warm and dry.  Neurological:     Mental Status: She is alert.  Psychiatric:        Attention and Perception: Attention normal.        Mood and Affect: Mood is depressed.        Speech: Speech is delayed.        Behavior: Behavior is slowed.        Thought Content: Thought content does not include suicidal ideation.     Assessment/Plan Patient will be restarted with ECT plan at least to course through this week.  Alethia Berthold, MD 09/30/2020, 6:11 PM

## 2020-09-30 NOTE — Anesthesia Postprocedure Evaluation (Signed)
Anesthesia Post Note  Patient: [name], applications  Procedure(s) Performed: ECT TX  Patient location during evaluation: PACU Anesthesia Type: General Level of consciousness: awake and alert Pain management: pain level controlled Vital Signs Assessment: post-procedure vital signs reviewed and stable Respiratory status: spontaneous breathing, nonlabored ventilation and respiratory function stable Cardiovascular status: blood pressure returned to baseline and stable Postop Assessment: no signs of nausea or vomiting Anesthetic complications: no   No notable events documented.   Last Vitals:  Vitals:   09/30/20 1251 09/30/20 1300  BP: (!) 163/90 (!) 161/96  Pulse: 84 79  Resp: 16 (!) 21  Temp: (!) 36.3 C   SpO2: 99% 97%    Last Pain:  Vitals:   09/30/20 1300  TempSrc:   PainSc: 0-No pain                 Mariadelosang Wynns

## 2020-09-30 NOTE — Anesthesia Preprocedure Evaluation (Signed)
Anesthesia Evaluation  Patient identified by MRN, date of birth, ID band Patient awake    Reviewed: Allergy & Precautions, NPO status , Patient's Chart, lab work & pertinent test results  History of Anesthesia Complications Negative for: history of anesthetic complications  Airway Mallampati: III  TM Distance: >3 FB Neck ROM: Full    Dental no notable dental hx.    Pulmonary asthma , sleep apnea ,    breath sounds clear to auscultation- rhonchi (-) wheezing      Cardiovascular hypertension, Pt. on medications (-) CAD, (-) Past MI, (-) Cardiac Stents and (-) CABG  Rhythm:Regular Rate:Normal - Systolic murmurs and - Diastolic murmurs    Neuro/Psych neg Seizures PSYCHIATRIC DISORDERS Anxiety Bipolar Disorder negative neurological ROS     GI/Hepatic Neg liver ROS, GERD  ,  Endo/Other  diabetesHypothyroidism   Renal/GU Renal InsufficiencyRenal disease     Musculoskeletal  (+) Arthritis ,   Abdominal (+) + obese,   Peds  Hematology  (+) anemia ,   Anesthesia Other Findings Past Medical History: No date: Anxiety No date: Arthritis of knee, left No date: Asthma No date: Bipolar 1 disorder, mixed (HCC) No date: Carpal tunnel syndrome     Comment:  bilateral BILATERAL: Cataract immature MONITORED BY DR GOLDSBOROUGH: Chronic kidney disease (CKD), stage III  (moderate) (HCC) No date: Diabetes mellitus without complication (HCC)     Comment:  no meds.  diet control borderline-  monitored: Elevated blood pressure (not hypertension) No date: Hyperlipidemia No date: Hypertension No date: Hypothyroidism hearing aid- bilateral: Impaired hearing No date: Nocturia No date: OSA on CPAP No date: Seasonal allergies No date: Stress incontinence, female No date: Urgency of urination   Reproductive/Obstetrics                             Anesthesia Physical Anesthesia Plan  ASA: 3  Anesthesia  Plan: General   Post-op Pain Management:    Induction: Intravenous  PONV Risk Score and Plan: 2 and Ondansetron  Airway Management Planned: Mask  Additional Equipment:   Intra-op Plan:   Post-operative Plan:   Informed Consent: I have reviewed the patients History and Physical, chart, labs and discussed the procedure including the risks, benefits and alternatives for the proposed anesthesia with the patient or authorized representative who has indicated his/her understanding and acceptance.     Dental advisory given  Plan Discussed with: CRNA and Anesthesiologist  Anesthesia Plan Comments:         Anesthesia Quick Evaluation

## 2020-09-30 NOTE — Transfer of Care (Signed)
Immediate Anesthesia Transfer of Care Note  Patient: Denise Serrano  Procedure(s) Performed: ECT TX  Patient Location: PACU  Anesthesia Type:General  Level of Consciousness: drowsy and patient cooperative  Airway & Oxygen Therapy: Patient Spontanous Breathing  Post-op Assessment: Report given to RN and Post -op Vital signs reviewed and stable  Post vital signs: Reviewed and stable  Last Vitals:  Vitals Value Taken Time  BP 163/90 09/30/20 1251  Temp    Pulse 83 09/30/20 1251  Resp 13 09/30/20 1251  SpO2 99 % 09/30/20 1251  Vitals shown include unvalidated device data.  Last Pain:  Vitals:   09/30/20 1100  TempSrc:   PainSc: 0-No pain         Complications: No notable events documented.

## 2020-10-02 ENCOUNTER — Ambulatory Visit: Payer: Self-pay | Admitting: Anesthesiology

## 2020-10-02 ENCOUNTER — Encounter: Payer: Self-pay | Admitting: Anesthesiology

## 2020-10-02 ENCOUNTER — Encounter (HOSPITAL_BASED_OUTPATIENT_CLINIC_OR_DEPARTMENT_OTHER)
Admission: RE | Admit: 2020-10-02 | Discharge: 2020-10-02 | Disposition: A | Discharge status: Home / Self Care | Payer: Medicare Other | Source: Ambulatory Visit | Attending: Psychiatry | Admitting: Psychiatry

## 2020-10-02 ENCOUNTER — Other Ambulatory Visit: Payer: Self-pay

## 2020-10-02 ENCOUNTER — Other Ambulatory Visit: Payer: Self-pay | Admitting: Psychiatry

## 2020-10-02 DIAGNOSIS — F339 Major depressive disorder, recurrent, unspecified: Secondary | ICD-10-CM | POA: Diagnosis not present

## 2020-10-02 DIAGNOSIS — F332 Major depressive disorder, recurrent severe without psychotic features: Secondary | ICD-10-CM | POA: Diagnosis not present

## 2020-10-02 LAB — GLUCOSE, CAPILLARY
Glucose-Capillary: 112 mg/dL — ABNORMAL HIGH (ref 70–99)
Glucose-Capillary: 124 mg/dL — ABNORMAL HIGH (ref 70–99)

## 2020-10-02 MED ORDER — KETAMINE HCL 10 MG/ML IJ SOLN: INTRAMUSCULAR | Status: DC | PRN

## 2020-10-02 MED ORDER — KETAMINE HCL 50 MG/5ML IJ SOSY
PREFILLED_SYRINGE | INTRAMUSCULAR | Status: AC
  Filled 2020-10-02: qty 10

## 2020-10-02 MED ORDER — METHOHEXITAL SODIUM 0.5 G IJ SOLR
INTRAMUSCULAR | Status: AC
  Filled 2020-10-02: qty 500

## 2020-10-02 MED ORDER — SODIUM CHLORIDE 0.9 % IV SOLN: INTRAVENOUS | Status: DC | PRN

## 2020-10-02 MED ORDER — KETAMINE HCL 10 MG/ML IJ SOLN
INTRAMUSCULAR | Status: DC | PRN
  Administered 2020-10-02: 80 mg via INTRAVENOUS

## 2020-10-02 MED ORDER — SODIUM CHLORIDE 0.9 % IV SOLN
500.0000 mL | Freq: Once | INTRAVENOUS | Status: AC
  Administered 2020-10-02: 500 mL via INTRAVENOUS

## 2020-10-02 MED ORDER — FENTANYL CITRATE (PF) 100 MCG/2ML IJ SOLN
25.0000 ug | INTRAMUSCULAR | Status: DC | PRN
Start: 2020-10-02 — End: 2020-10-03

## 2020-10-02 MED ORDER — SODIUM CHLORIDE 0.9 % IV SOLN: 500.0000 mL | Freq: Once | INTRAVENOUS | Status: DC

## 2020-10-02 MED ORDER — ONDANSETRON HCL 4 MG/2ML IJ SOLN: 4.0000 mg | Freq: Once | INTRAMUSCULAR | Status: DC | PRN

## 2020-10-02 MED ORDER — SUCCINYLCHOLINE CHLORIDE 200 MG/10ML IV SOSY
PREFILLED_SYRINGE | INTRAVENOUS | Status: AC
  Filled 2020-10-02: qty 10

## 2020-10-02 MED ORDER — SUCCINYLCHOLINE CHLORIDE 20 MG/ML IJ SOLN
INTRAMUSCULAR | Status: DC | PRN
  Administered 2020-10-02: 80 mg via INTRAVENOUS

## 2020-10-02 MED ORDER — MEPERIDINE HCL 25 MG/ML IJ SOLN: 6.2500 mg | INTRAMUSCULAR | Status: DC | PRN

## 2020-10-02 NOTE — Procedures (Signed)
ECT SERVICES Physician's Interval Evaluation & Treatment Note  Patient Identification: Ember Rickel MRN:  [id number] Date of Evaluation:  10/02/2020 TX #: 2  MADRS:   MMSE:   P.E. Findings:  No change to physical exam  Psychiatric Interval Note:  Mood still mildly dysphoric  Subjective:  Patient is a 65 y.o. female seen for evaluation for Electroconvulsive Therapy. Depressed mild aches and pains  Treatment Summary:   '[x]'$   Right Unilateral             '[]'$  Bilateral   % Energy : 0.3 ms 100%   Impedance: 1900 ohms  Seizure Energy Index: 23,805 V squared  Postictal Suppression Index: 80%  Seizure Concordance Index: 79%  Medications  Pre Shock: Brevital 10 mg ketamine 80 mg succinylcholine 80 mg  Post Shock:    Seizure Duration: 13 seconds EMG 14 seconds EEG   Comments: Next treatment Friday  Lungs:  '[x]'$   Clear to auscultation               '[]'$  Other:   Heart:    '[x]'$   Regular rhythm             '[]'$  irregular rhythm    '[x]'$   Previous H&P reviewed, patient examined and there are NO CHANGES                 '[]'$   Previous H&P reviewed, patient examined and there are changes noted.   Alethia Berthold, MD 7/13/20226:29 PM

## 2020-10-02 NOTE — Transfer of Care (Signed)
Immediate Anesthesia Transfer of Care Note  Patient: Denise Serrano  Procedure(s) Performed: ECT TX  Patient Location: PACU  Anesthesia Type:General  Level of Consciousness: responds to stimulation and obtunded  Airway & Oxygen Therapy: Patient Spontanous Breathing  Post-op Assessment: Report given to RN and Post -op Vital signs reviewed and stable  Post vital signs: Reviewed and stable  Last Vitals:  Vitals Value Taken Time  BP 163/79 10/02/20 1250  Temp 36.4 C 10/02/20 1250  Pulse 76 10/02/20 1250  Resp 12 10/02/20 1250  SpO2 92 % 10/02/20 1250    Last Pain:  Vitals:   10/02/20 1250  TempSrc:   PainSc: Asleep         Complications: No notable events documented.

## 2020-10-02 NOTE — Anesthesia Preprocedure Evaluation (Signed)
Anesthesia Evaluation  Patient identified by MRN, date of birth, ID band Patient awake    Reviewed: Allergy & Precautions, NPO status , Patient's Chart, lab work & pertinent test results, reviewed documented beta blocker date and time   History of Anesthesia Complications Negative for: history of anesthetic complications  Airway Mallampati: III  TM Distance: >3 FB Neck ROM: Full    Dental no notable dental hx.    Pulmonary asthma , sleep apnea and Continuous Positive Airway Pressure Ventilation ,    breath sounds clear to auscultation- rhonchi (-) wheezing      Cardiovascular hypertension, Pt. on medications and Pt. on home beta blockers (-) CAD, (-) Past MI, (-) Cardiac Stents and (-) CABG  Rhythm:Regular Rate:Normal - Systolic murmurs and - Diastolic murmurs    Neuro/Psych neg Seizures PSYCHIATRIC DISORDERS Anxiety Depression Bipolar Disorder  Neuromuscular disease    GI/Hepatic Neg liver ROS, GERD  Medicated,  Endo/Other  diabetes, Well Controlled, Oral Hypoglycemic AgentsHypothyroidism   Renal/GU Renal InsufficiencyRenal disease     Musculoskeletal  (+) Arthritis ,   Abdominal (+) + obese,   Peds  Hematology  (+) anemia ,   Anesthesia Other Findings Past Medical History: No date: Anxiety No date: Arthritis of knee, left No date: Asthma No date: Bipolar 1 disorder, mixed (HCC) No date: Carpal tunnel syndrome     Comment:  bilateral BILATERAL: Cataract immature MONITORED BY DR GOLDSBOROUGH: Chronic kidney disease (CKD), stage III  (moderate) (HCC) No date: Diabetes mellitus without complication (HCC)     Comment:  no meds.  diet control borderline-  monitored: Elevated blood pressure (not hypertension) No date: Hyperlipidemia No date: Hypertension No date: Hypothyroidism hearing aid- bilateral: Impaired hearing No date: Nocturia No date: OSA on CPAP No date: Seasonal allergies No date: Stress  incontinence, female No date: Urgency of urination   Reproductive/Obstetrics                             Anesthesia Physical  Anesthesia Plan  ASA: 3  Anesthesia Plan: General   Post-op Pain Management:    Induction: Intravenous  PONV Risk Score and Plan: 2 and Ondansetron  Airway Management Planned: Mask  Additional Equipment:   Intra-op Plan:   Post-operative Plan:   Informed Consent: I have reviewed the patients History and Physical, chart, labs and discussed the procedure including the risks, benefits and alternatives for the proposed anesthesia with the patient or authorized representative who has indicated his/her understanding and acceptance.     Dental advisory given  Plan Discussed with: CRNA, Anesthesiologist and Surgeon  Anesthesia Plan Comments:         Anesthesia Quick Evaluation

## 2020-10-02 NOTE — Anesthesia Postprocedure Evaluation (Signed)
Anesthesia Post Note  Patient: [name], applications  Procedure(s) Performed: ECT TX  Patient location during evaluation: PACU Anesthesia Type: General Level of consciousness: awake and alert, awake and oriented Pain management: pain level controlled Vital Signs Assessment: post-procedure vital signs reviewed and stable Respiratory status: spontaneous breathing, nonlabored ventilation and respiratory function stable Cardiovascular status: blood pressure returned to baseline and stable Postop Assessment: no apparent nausea or vomiting Anesthetic complications: no   No notable events documented.   Last Vitals:  Vitals:   10/02/20 1320 10/02/20 1350  BP: (!) 148/75 135/72  Pulse: 63 60  Resp: 20 20  Temp: 36.7 C 36.7 C  SpO2: 96%     Last Pain:  Vitals:   10/02/20 1350  TempSrc: Oral  PainSc: 0-No pain                 Phill Mutter

## 2020-10-02 NOTE — H&P (Signed)
Denise Serrano is an 65 y.o. female.   Chief Complaint: Continues with depressed and anxious mood no worse than previously HPI: Recurrent depression with good response to ECT  Past Medical History:  Diagnosis Date   Anxiety    Arthritis of knee, left    Asthma    Bipolar 1 disorder, mixed ([place])    Carpal tunnel syndrome    bilateral   Cataract immature BILATERAL   Chronic kidney disease (CKD), stage III (moderate) ([place]) MONITORED BY DR Moshe Cipro   Diabetes mellitus without complication ([place])    no meds.  diet control   Elevated blood pressure (not hypertension) borderline-  monitored   Hyperlipidemia    Hypertension    Hypothyroidism    Impaired hearing hearing aid- bilateral   Nocturia    OSA on CPAP    Seasonal allergies    Stress incontinence, female    Urgency of urination     Past Surgical History:  Procedure Laterality Date   BLADDER SUSPENSION  05/07/2011   Procedure: SPARC PROCEDURE;  Surgeon: Reece Packer, MD;  Location: [facility];  Service: Urology;  Laterality: N/A;  cysto, sparc sling    DX LAPAROSCOPY FOR INFERTILITY  27 YRS AGO   TUBAL LIGATION  68 YRS AGO    Family History  Problem Relation Age of Onset   Heart attack Mother    Heart attack Father    Social History:  reports that she has never smoked. She has never used smokeless tobacco. She reports previous alcohol use. She reports that she does not use drugs.  Allergies:  Allergies  Allergen Reactions   Amoxicillin Diarrhea   Iodine Other (See Comments)    Betadine soap causes her to want to pass out   Penicillin G Diarrhea    (Not in a hospital admission)   Results for orders placed or performed during the hospital encounter of 10/02/20 (from the past 48 hour(s))  Glucose, capillary     Status: Abnormal   Collection Time: 10/02/20 10:39 AM  Result Value Ref Range   Glucose-Capillary 112 (H) 70 - 99 mg/dL    Comment: Glucose reference range applies only to samples  taken after fasting for at least 8 hours.  Glucose, capillary     Status: Abnormal   Collection Time: 10/02/20 12:55 PM  Result Value Ref Range   Glucose-Capillary 124 (H) 70 - 99 mg/dL    Comment: Glucose reference range applies only to samples taken after fasting for at least 8 hours.   No results found.  Review of Systems  Constitutional: Negative.   HENT: Negative.    Eyes: Negative.   Respiratory: Negative.    Cardiovascular: Negative.   Gastrointestinal: Negative.   Musculoskeletal: Negative.   Skin: Negative.   Neurological: Negative.   Psychiatric/Behavioral: Negative.     Blood pressure 135/72, pulse 60, temperature 98.1 F (36.7 C), temperature source Oral, resp. rate 20, height '5\' 3"'$  (1.6 m), weight 86.2 kg, SpO2 96 %. Physical Exam Constitutional:      Appearance: She is well-developed.  HENT:     Head: Normocephalic and atraumatic.  Eyes:     Conjunctiva/sclera: Conjunctivae normal.     Pupils: Pupils are equal, round, and reactive to light.  Cardiovascular:     Heart sounds: Normal heart sounds.  Pulmonary:     Effort: Pulmonary effort is normal.  Abdominal:     Palpations: Abdomen is soft.  Musculoskeletal:        General:  Normal range of motion.     Cervical back: Normal range of motion.  Skin:    General: Skin is warm and dry.  Neurological:     General: No focal deficit present.     Mental Status: She is alert.  Psychiatric:        Attention and Perception: Attention normal.        Mood and Affect: Mood is depressed.        Speech: Speech normal.        Behavior: Behavior normal.        Thought Content: Thought content normal.        Cognition and Memory: Cognition normal.        Judgment: Judgment normal.     Assessment/Plan Continue at least through this week at the patient's request.  Tolerating treatment well  Alethia Berthold, MD 10/02/2020, 6:19 PM

## 2020-10-04 ENCOUNTER — Encounter: Payer: Self-pay | Admitting: Anesthesiology

## 2020-10-04 ENCOUNTER — Other Ambulatory Visit: Payer: Self-pay

## 2020-10-04 ENCOUNTER — Encounter (HOSPITAL_BASED_OUTPATIENT_CLINIC_OR_DEPARTMENT_OTHER)
Admission: RE | Admit: 2020-10-04 | Discharge: 2020-10-04 | Disposition: A | Discharge status: Home / Self Care | Payer: Medicare Other | Source: Ambulatory Visit | Attending: Psychiatry | Admitting: Psychiatry

## 2020-10-04 ENCOUNTER — Other Ambulatory Visit: Payer: Self-pay | Admitting: Psychiatry

## 2020-10-04 ENCOUNTER — Ambulatory Visit: Payer: Self-pay | Admitting: Anesthesiology

## 2020-10-04 DIAGNOSIS — F332 Major depressive disorder, recurrent severe without psychotic features: Secondary | ICD-10-CM

## 2020-10-04 DIAGNOSIS — F339 Major depressive disorder, recurrent, unspecified: Secondary | ICD-10-CM | POA: Diagnosis not present

## 2020-10-04 LAB — GLUCOSE, CAPILLARY
Glucose-Capillary: 110 mg/dL — ABNORMAL HIGH (ref 70–99)
Glucose-Capillary: 126 mg/dL — ABNORMAL HIGH (ref 70–99)

## 2020-10-04 MED ORDER — SODIUM CHLORIDE 0.9 % IV SOLN: INTRAVENOUS | Status: DC | PRN

## 2020-10-04 MED ORDER — KETAMINE HCL 50 MG/5ML IJ SOSY
PREFILLED_SYRINGE | INTRAMUSCULAR | Status: AC
  Filled 2020-10-04: qty 5

## 2020-10-04 MED ORDER — KETAMINE HCL 10 MG/ML IJ SOLN
INTRAMUSCULAR | Status: DC | PRN
  Administered 2020-10-04: 80 mg via INTRAVENOUS

## 2020-10-04 MED ORDER — SODIUM CHLORIDE 0.9 % IV SOLN
500.0000 mL | Freq: Once | INTRAVENOUS | Status: AC
  Administered 2020-10-04: 500 mL via INTRAVENOUS

## 2020-10-04 MED ORDER — SUCCINYLCHOLINE CHLORIDE 20 MG/ML IJ SOLN
INTRAMUSCULAR | Status: DC | PRN
  Administered 2020-10-04: 80 mg via INTRAVENOUS

## 2020-10-04 MED ORDER — SUCCINYLCHOLINE CHLORIDE 200 MG/10ML IV SOSY
PREFILLED_SYRINGE | INTRAVENOUS | Status: AC
  Filled 2020-10-04: qty 10

## 2020-10-04 NOTE — H&P (Signed)
Denise Serrano is an 65 y.o. female.   Chief Complaint: Mood still dysphoric minimal improvement so far HPI: History of recurrent depression with previous response to ECT  Past Medical History:  Diagnosis Date   Anxiety    Arthritis of knee, left    Asthma    Bipolar 1 disorder, mixed ([place])    Carpal tunnel syndrome    bilateral   Cataract immature BILATERAL   Chronic kidney disease (CKD), stage III (moderate) ([place]) MONITORED BY DR Moshe Cipro   Diabetes mellitus without complication ([place])    no meds.  diet control   Elevated blood pressure (not hypertension) borderline-  monitored   Hyperlipidemia    Hypertension    Hypothyroidism    Impaired hearing hearing aid- bilateral   Nocturia    OSA on CPAP    Seasonal allergies    Stress incontinence, female    Urgency of urination     Past Surgical History:  Procedure Laterality Date   BLADDER SUSPENSION  05/07/2011   Procedure: SPARC PROCEDURE;  Surgeon: Reece Packer, MD;  Location: [facility];  Service: Urology;  Laterality: N/A;  cysto, sparc sling    DX LAPAROSCOPY FOR INFERTILITY  27 YRS AGO   TUBAL LIGATION  65 YRS AGO    Family History  Problem Relation Age of Onset   Heart attack Mother    Heart attack Father    Social History:  reports that she has never smoked. She has never used smokeless tobacco. She reports previous alcohol use. She reports that she does not use drugs.  Allergies:  Allergies  Allergen Reactions   Amoxicillin Diarrhea   Iodine Other (See Comments)    Betadine soap causes her to want to pass out   Penicillin G Diarrhea    (Not in a hospital admission)   Results for orders placed or performed during the hospital encounter of 10/04/20 (from the past 48 hour(s))  Glucose, capillary     Status: Abnormal   Collection Time: 10/04/20 12:09 PM  Result Value Ref Range   Glucose-Capillary 110 (H) 70 - 99 mg/dL    Comment: Glucose reference range applies only to samples  taken after fasting for at least 8 hours.  Glucose, capillary     Status: Abnormal   Collection Time: 10/04/20  1:33 PM  Result Value Ref Range   Glucose-Capillary 126 (H) 70 - 99 mg/dL    Comment: Glucose reference range applies only to samples taken after fasting for at least 8 hours.   No results found.  Review of Systems  Constitutional: Negative.   HENT: Negative.    Eyes: Negative.   Respiratory: Negative.    Cardiovascular: Negative.   Gastrointestinal: Negative.   Musculoskeletal: Negative.   Skin: Negative.   Neurological: Negative.   Psychiatric/Behavioral:  Positive for dysphoric mood. Negative for suicidal ideas.    Blood pressure (!) 158/83, pulse 67, temperature 97.9 F (36.6 C), resp. rate 16, height '5\' 3"'$  (1.6 m), weight 86.2 kg, SpO2 96 %. Physical Exam Constitutional:      Appearance: She is well-developed.  HENT:     Head: Normocephalic and atraumatic.  Eyes:     Conjunctiva/sclera: Conjunctivae normal.     Pupils: Pupils are equal, round, and reactive to light.  Cardiovascular:     Heart sounds: Normal heart sounds.  Pulmonary:     Effort: Pulmonary effort is normal.  Abdominal:     Palpations: Abdomen is soft.  Musculoskeletal:  General: Normal range of motion.     Cervical back: Normal range of motion.  Skin:    General: Skin is warm and dry.  Neurological:     Mental Status: She is alert.  Psychiatric:        Mood and Affect: Mood normal.        Thought Content: Thought content normal.        Judgment: Judgment normal.     Assessment/Plan Treatment today and continue index course next week  Alethia Berthold, MD 10/04/2020, 7:07 PM

## 2020-10-04 NOTE — Anesthesia Preprocedure Evaluation (Signed)
Anesthesia Evaluation  Patient identified by MRN, date of birth, ID band Patient awake    Reviewed: Allergy & Precautions, NPO status , Patient's Chart, lab work & pertinent test results, reviewed documented beta blocker date and time   History of Anesthesia Complications Negative for: history of anesthetic complications  Airway Mallampati: III  TM Distance: >3 FB Neck ROM: Full    Dental no notable dental hx. (+) Chipped   Pulmonary asthma , sleep apnea and Continuous Positive Airway Pressure Ventilation ,           Cardiovascular hypertension, Pt. on medications and Pt. on home beta blockers (-) CAD, (-) Past MI, (-) Cardiac Stents and (-) CABG  - Systolic murmurs    Neuro/Psych neg Seizures PSYCHIATRIC DISORDERS Anxiety Depression Bipolar Disorder  Neuromuscular disease    GI/Hepatic Neg liver ROS, GERD  Medicated,  Endo/Other  diabetes, Well Controlled, Oral Hypoglycemic AgentsHypothyroidism   Renal/GU Renal InsufficiencyRenal disease     Musculoskeletal  (+) Arthritis ,   Abdominal (+) + obese,   Peds  Hematology  (+) Blood dyscrasia, anemia ,   Anesthesia Other Findings Past Medical History: No date: Anxiety No date: Arthritis of knee, left No date: Asthma No date: Bipolar 1 disorder, mixed (HCC) No date: Carpal tunnel syndrome     Comment:  bilateral BILATERAL: Cataract immature MONITORED BY DR GOLDSBOROUGH: Chronic kidney disease (CKD), stage III  (moderate) (HCC) No date: Diabetes mellitus without complication (HCC)     Comment:  no meds.  diet control borderline-  monitored: Elevated blood pressure (not hypertension) No date: Hyperlipidemia No date: Hypertension No date: Hypothyroidism hearing aid- bilateral: Impaired hearing No date: Nocturia No date: OSA on CPAP No date: Seasonal allergies No date: Stress incontinence, female No date: Urgency of urination   Reproductive/Obstetrics                              Anesthesia Physical  Anesthesia Plan  ASA: 3  Anesthesia Plan: General   Post-op Pain Management:    Induction: Intravenous  PONV Risk Score and Plan: 2  Airway Management Planned: Mask  Additional Equipment:   Intra-op Plan:   Post-operative Plan:   Informed Consent: I have reviewed the patients History and Physical, chart, labs and discussed the procedure including the risks, benefits and alternatives for the proposed anesthesia with the patient or authorized representative who has indicated his/her understanding and acceptance.     Dental advisory given  Plan Discussed with: CRNA, Anesthesiologist and Surgeon  Anesthesia Plan Comments: (Patient consented for risks of anesthesia including but not limited to:  - adverse reactions to medications - risk of airway placement if required - damage to eyes, teeth, lips or other oral mucosa - nerve damage due to positioning  - sore throat or hoarseness - Damage to heart, brain, nerves, lungs, other parts of body or loss of life  Patient voiced understanding.)        Anesthesia Quick Evaluation

## 2020-10-04 NOTE — Transfer of Care (Signed)
Immediate Anesthesia Transfer of Care Note  Patient: Denise Serrano  Procedure(s) Performed: ECT TX  Patient Location: PACU  Anesthesia Type:General  Level of Consciousness: drowsy  Airway & Oxygen Therapy: Patient Spontanous Breathing  Post-op Assessment: Report given to RN  Post vital signs: stable  Last Vitals:  Vitals Value Taken Time  BP    Temp    Pulse    Resp    SpO2      Last Pain:  Vitals:   10/04/20 1034  TempSrc:   PainSc: 0-No pain         Complications: No notable events documented.

## 2020-10-04 NOTE — Anesthesia Postprocedure Evaluation (Signed)
Anesthesia Post Note  Patient: [name], applications  Procedure(s) Performed: ECT TX  Patient location during evaluation: PACU Anesthesia Type: General Level of consciousness: awake and alert Pain management: pain level controlled Vital Signs Assessment: post-procedure vital signs reviewed and stable Respiratory status: spontaneous breathing, nonlabored ventilation, respiratory function stable and patient connected to nasal cannula oxygen Cardiovascular status: blood pressure returned to baseline and stable Postop Assessment: no apparent nausea or vomiting Anesthetic complications: no   No notable events documented.   Last Vitals:  Vitals:   10/04/20 1410 10/04/20 1423  BP: (!) 142/75 (!) 158/83  Pulse: 67 67  Resp: 11 16  Temp: 36.6 C   SpO2: 94% 96%    Last Pain:  Vitals:   10/04/20 1410  TempSrc:   PainSc: 0-No pain                 Precious Haws Rjay Revolorio

## 2020-10-04 NOTE — Procedures (Signed)
ECT SERVICES Physician's Interval Evaluation & Treatment Note  Patient Identification: Denise Serrano MRN:  [id number] Date of Evaluation:  10/04/2020 TX #: 3  MADRS:   MMSE:   P.E. Findings:  No change to physical exam  Psychiatric Interval Note:  Mood still depressed but not suicidal  Subjective:  Patient is a 65 y.o. female seen for evaluation for Electroconvulsive Therapy. Dysphoric but no other new complaint  Treatment Summary:   '[x]'$   Right Unilateral             '[]'$  Bilateral   % Energy : 0.3 ms 100%   Impedance: 2110 ohms  Seizure Energy Index: 45,406 V squared  Postictal Suppression Index: No reading  Seizure Concordance Index: 81%  Medications  Pre Shock: Ketamine 80 mg succinylcholine 80 mg  Post Shock:    Seizure Duration: 22 seconds EMG 28 seconds EEG   Comments: Improved seizure.  Well-tolerated.  Next treatment Monday.  Lungs:  '[x]'$   Clear to auscultation               '[]'$  Other:   Heart:    '[x]'$   Regular rhythm             '[]'$  irregular rhythm    '[x]'$   Previous H&P reviewed, patient examined and there are NO CHANGES                 '[]'$   Previous H&P reviewed, patient examined and there are changes noted.   Alethia Berthold, MD 7/15/20227:14 PM

## 2020-10-07 ENCOUNTER — Other Ambulatory Visit: Payer: Self-pay | Admitting: Psychiatry

## 2020-10-07 ENCOUNTER — Ambulatory Visit: Payer: Self-pay | Admitting: Anesthesiology

## 2020-10-07 ENCOUNTER — Encounter: Payer: Self-pay | Admitting: Anesthesiology

## 2020-10-07 ENCOUNTER — Encounter (HOSPITAL_BASED_OUTPATIENT_CLINIC_OR_DEPARTMENT_OTHER)
Admission: RE | Admit: 2020-10-07 | Discharge: 2020-10-07 | Disposition: A | Discharge status: Home / Self Care | Payer: Medicare Other | Source: Ambulatory Visit | Attending: Psychiatry | Admitting: Psychiatry

## 2020-10-07 ENCOUNTER — Other Ambulatory Visit: Payer: Self-pay

## 2020-10-07 DIAGNOSIS — F332 Major depressive disorder, recurrent severe without psychotic features: Secondary | ICD-10-CM

## 2020-10-07 DIAGNOSIS — F339 Major depressive disorder, recurrent, unspecified: Secondary | ICD-10-CM | POA: Diagnosis not present

## 2020-10-07 LAB — GLUCOSE, CAPILLARY: Glucose-Capillary: 140 mg/dL — ABNORMAL HIGH (ref 70–99)

## 2020-10-07 MED ORDER — SODIUM CHLORIDE 0.9 % IV SOLN: 500.0000 mL | Freq: Once | INTRAVENOUS | Status: AC

## 2020-10-07 MED ORDER — SUCCINYLCHOLINE CHLORIDE 20 MG/ML IJ SOLN
INTRAMUSCULAR | Status: DC | PRN
  Administered 2020-10-07: 80 mg via INTRAVENOUS

## 2020-10-07 MED ORDER — METHOHEXITAL SODIUM 100 MG/10ML IV SOSY
PREFILLED_SYRINGE | INTRAVENOUS | Status: DC | PRN
  Administered 2020-10-07: 80 mg via INTRAVENOUS

## 2020-10-07 NOTE — Anesthesia Preprocedure Evaluation (Signed)
Anesthesia Evaluation  Patient identified by MRN, date of birth, ID band Patient awake    Reviewed: Allergy & Precautions, NPO status , Patient's Chart, lab work & pertinent test results, reviewed documented beta blocker date and time   History of Anesthesia Complications Negative for: history of anesthetic complications  Airway Mallampati: III  TM Distance: >3 FB Neck ROM: Full    Dental no notable dental hx. (+) Chipped   Pulmonary asthma , sleep apnea and Continuous Positive Airway Pressure Ventilation ,           Cardiovascular hypertension, Pt. on medications and Pt. on home beta blockers (-) CAD, (-) Past MI, (-) Cardiac Stents and (-) CABG  - Systolic murmurs    Neuro/Psych neg Seizures PSYCHIATRIC DISORDERS Anxiety Depression Bipolar Disorder  Neuromuscular disease    GI/Hepatic Neg liver ROS, GERD  Medicated,  Endo/Other  diabetes, Well Controlled, Oral Hypoglycemic AgentsHypothyroidism   Renal/GU Renal InsufficiencyRenal disease     Musculoskeletal  (+) Arthritis ,   Abdominal (+) + obese,   Peds  Hematology  (+) Blood dyscrasia, anemia ,   Anesthesia Other Findings Past Medical History: No date: Anxiety No date: Arthritis of knee, left No date: Asthma No date: Bipolar 1 disorder, mixed (HCC) No date: Carpal tunnel syndrome     Comment:  bilateral BILATERAL: Cataract immature MONITORED BY DR GOLDSBOROUGH: Chronic kidney disease (CKD), stage III  (moderate) (HCC) No date: Diabetes mellitus without complication (HCC)     Comment:  no meds.  diet control borderline-  monitored: Elevated blood pressure (not hypertension) No date: Hyperlipidemia No date: Hypertension No date: Hypothyroidism hearing aid- bilateral: Impaired hearing No date: Nocturia No date: OSA on CPAP No date: Seasonal allergies No date: Stress incontinence, female No date: Urgency of urination   Reproductive/Obstetrics                              Anesthesia Physical  Anesthesia Plan  ASA: 3  Anesthesia Plan: General   Post-op Pain Management:    Induction: Intravenous  PONV Risk Score and Plan: 2  Airway Management Planned: Mask  Additional Equipment:   Intra-op Plan:   Post-operative Plan:   Informed Consent: I have reviewed the patients History and Physical, chart, labs and discussed the procedure including the risks, benefits and alternatives for the proposed anesthesia with the patient or authorized representative who has indicated his/her understanding and acceptance.     Dental advisory given  Plan Discussed with: CRNA, Anesthesiologist and Surgeon  Anesthesia Plan Comments: (Patient consented for risks of anesthesia including but not limited to:  - adverse reactions to medications - risk of airway placement if required - damage to eyes, teeth, lips or other oral mucosa - nerve damage due to positioning  - sore throat or hoarseness - Damage to heart, brain, nerves, lungs, other parts of body or loss of life  Patient voiced understanding.)        Anesthesia Quick Evaluation

## 2020-10-07 NOTE — Procedures (Signed)
ECT SERVICES Physician's Interval Evaluation & Treatment Note  Patient Identification: Denise Serrano MRN:  [id number] Date of Evaluation:  10/07/2020 TX #: 4  MADRS:   MMSE:   P.E. Findings:  No change physical exam  Psychiatric Interval Note:  Still dysphoric down flat  Subjective:  Patient is a 66 y.o. female seen for evaluation for Electroconvulsive Therapy. No suicidal ideation  Treatment Summary:   '[x]'$   Right Unilateral             '[]'$  Bilateral   % Energy : 0.3 ms 100%   Impedance: 1460 ohms  Seizure Energy Index: No reading  Postictal Suppression Index: No reading  Seizure Concordance Index: No reading  Medications  Pre Shock: Brevital 80 mg succinylcholine 80 mg  Post Shock:    Seizure Duration: 4 seconds by EEG 4 seconds EMG   Comments: We will try to make sure that she only gets ketamine for anesthetic next time  Lungs:  '[x]'$   Clear to auscultation               '[]'$  Other:   Heart:    '[x]'$   Regular rhythm             '[]'$  irregular rhythm    '[x]'$   Previous H&P reviewed, patient examined and there are NO CHANGES                 '[]'$   Previous H&P reviewed, patient examined and there are changes noted.   Alethia Berthold, MD 7/18/20226:32 PM

## 2020-10-07 NOTE — Anesthesia Postprocedure Evaluation (Signed)
Anesthesia Post Note  Patient: Denise Serrano, Denise Serrano  Procedure(s) Performed: ECT TX  Patient location during evaluation: PACU Anesthesia Type: General Level of consciousness: awake and alert Pain management: pain level controlled Vital Signs Assessment: post-procedure vital signs reviewed and stable Respiratory status: spontaneous breathing, nonlabored ventilation, respiratory function stable and patient connected to nasal cannula oxygen Cardiovascular status: blood pressure returned to baseline and stable Postop Assessment: no apparent nausea or vomiting Anesthetic complications: no   No notable events documented.   Last Vitals:  Vitals:   10/07/20 1416 10/07/20 1446  BP: (!) 142/70 140/70  Pulse: 61 60  Resp: 12 12  Temp: 36.9 C 36.8 C  SpO2: 95%     Last Pain:  Vitals:   10/07/20 1446  TempSrc: Oral  PainSc: 0-No pain                 Arita Miss

## 2020-10-07 NOTE — Transfer of Care (Signed)
Immediate Anesthesia Transfer of Care Note  Patient: Denise Serrano  Procedure(s) Performed: ECT TX  Patient Location: PACU  Anesthesia Type:General  Level of Consciousness: drowsy and patient cooperative  Airway & Oxygen Therapy: Patient Spontanous Breathing  Post-op Assessment: Report given to RN and Post -op Vital signs reviewed and stable  Post vital signs: Reviewed and stable  Last Vitals:  Vitals Value Taken Time  BP 152/78 10/07/20 1350  Temp 36.2 C 10/07/20 1349  Pulse 67 10/07/20 1353  Resp 12 10/07/20 1353  SpO2 96 % 10/07/20 1353  Vitals shown include unvalidated device data.  Last Pain:  Vitals:   10/07/20 1350  TempSrc:   PainSc: 0-No pain         Complications: No notable events documented.

## 2020-10-07 NOTE — H&P (Signed)
Denise Serrano is an 65 y.o. female.   Chief Complaint: Patient with history of persistent depression and previous response to ECT currently getting index treatment HPI: History of past positive response to ECT  Past Medical History:  Diagnosis Date   Anxiety    Arthritis of knee, left    Asthma    Bipolar 1 disorder, mixed ([place])    Carpal tunnel syndrome    bilateral   Cataract immature BILATERAL   Chronic kidney disease (CKD), stage III (moderate) ([place]) MONITORED BY DR Moshe Cipro   Diabetes mellitus without complication ([place])    no meds.  diet control   Elevated blood pressure (not hypertension) borderline-  monitored   Hyperlipidemia    Hypertension    Hypothyroidism    Impaired hearing hearing aid- bilateral   Nocturia    OSA on CPAP    Seasonal allergies    Stress incontinence, female    Urgency of urination     Past Surgical History:  Procedure Laterality Date   BLADDER SUSPENSION  05/07/2011   Procedure: SPARC PROCEDURE;  Surgeon: Reece Packer, MD;  Location: [facility];  Service: Urology;  Laterality: N/A;  cysto, sparc sling    DX LAPAROSCOPY FOR INFERTILITY  27 YRS AGO   TUBAL LIGATION  10 YRS AGO    Family History  Problem Relation Age of Onset   Heart attack Mother    Heart attack Father    Social History:  reports that she has never smoked. She has never used smokeless tobacco. She reports previous alcohol use. She reports that she does not use drugs.  Allergies:  Allergies  Allergen Reactions   Amoxicillin Diarrhea   Iodine Other (See Comments)    Betadine soap causes her to want to pass out   Penicillin G Diarrhea    (Not in a hospital admission)   Results for orders placed or performed during the hospital encounter of 10/07/20 (from the past 48 hour(s))  Glucose, capillary     Status: Abnormal   Collection Time: 10/07/20 10:55 AM  Result Value Ref Range   Glucose-Capillary 140 (H) 70 - 99 mg/dL    Comment: Glucose  reference range applies only to samples taken after fasting for at least 8 hours.   No results found.  Review of Systems  Constitutional: Negative.   HENT: Negative.    Eyes: Negative.   Respiratory: Negative.    Cardiovascular: Negative.   Gastrointestinal: Negative.   Musculoskeletal: Negative.   Skin: Negative.   Neurological: Negative.   Psychiatric/Behavioral:  Positive for dysphoric mood. Negative for suicidal ideas.   All other systems reviewed and are negative.  Blood pressure 125/70, pulse (!) 55, temperature (!) 97.5 F (36.4 C), temperature source Oral, resp. rate 18, height '5\' 3"'$  (1.6 m), weight 86.2 kg, SpO2 98 %. Physical Exam Vitals and nursing note reviewed.  Constitutional:      Appearance: She is well-developed.  HENT:     Head: Normocephalic and atraumatic.  Eyes:     Conjunctiva/sclera: Conjunctivae normal.     Pupils: Pupils are equal, round, and reactive to light.  Cardiovascular:     Heart sounds: Normal heart sounds.  Pulmonary:     Effort: Pulmonary effort is normal.  Abdominal:     Palpations: Abdomen is soft.  Musculoskeletal:        General: Normal range of motion.     Cervical back: Normal range of motion.  Skin:    General: Skin is  warm and dry.  Neurological:     Mental Status: She is alert.  Psychiatric:        Attention and Perception: She is inattentive.        Mood and Affect: Affect is blunt.        Speech: Speech is delayed.        Behavior: Behavior is slowed.        Thought Content: Thought content does not include suicidal ideation.     Assessment/Plan Patient is currently reporting not being aware of any subjective improvement in her mood or symptoms.  Today's treatment #4 plan is to continue at least through this week while reassessing appropriate further treatment plan  Alethia Berthold, MD 10/07/2020, 12:21 PM

## 2020-10-08 ENCOUNTER — Other Ambulatory Visit: Payer: Self-pay | Admitting: Psychiatry

## 2020-10-09 ENCOUNTER — Ambulatory Visit: Payer: Self-pay | Admitting: Anesthesiology

## 2020-10-09 ENCOUNTER — Encounter (HOSPITAL_BASED_OUTPATIENT_CLINIC_OR_DEPARTMENT_OTHER)
Admission: RE | Admit: 2020-10-09 | Discharge: 2020-10-09 | Disposition: A | Discharge status: Home / Self Care | Payer: Medicare Other | Source: Ambulatory Visit | Attending: Psychiatry | Admitting: Psychiatry

## 2020-10-09 ENCOUNTER — Other Ambulatory Visit: Payer: Self-pay

## 2020-10-09 ENCOUNTER — Encounter: Payer: Self-pay | Admitting: Anesthesiology

## 2020-10-09 DIAGNOSIS — F332 Major depressive disorder, recurrent severe without psychotic features: Secondary | ICD-10-CM | POA: Diagnosis not present

## 2020-10-09 DIAGNOSIS — F339 Major depressive disorder, recurrent, unspecified: Secondary | ICD-10-CM | POA: Diagnosis not present

## 2020-10-09 LAB — GLUCOSE, CAPILLARY: Glucose-Capillary: 123 mg/dL — ABNORMAL HIGH (ref 70–99)

## 2020-10-09 MED ORDER — KETAMINE HCL 50 MG/5ML IJ SOSY
PREFILLED_SYRINGE | INTRAMUSCULAR | Status: AC
  Filled 2020-10-09: qty 10

## 2020-10-09 MED ORDER — SODIUM CHLORIDE 0.9 % IV SOLN
500.0000 mL | Freq: Once | INTRAVENOUS | Status: AC
  Administered 2020-10-09: 500 mL via INTRAVENOUS

## 2020-10-09 MED ORDER — SODIUM CHLORIDE 0.9 % IV SOLN: INTRAVENOUS | Status: DC | PRN

## 2020-10-09 MED ORDER — SUCCINYLCHOLINE CHLORIDE 200 MG/10ML IV SOSY
PREFILLED_SYRINGE | INTRAVENOUS | Status: DC | PRN
  Administered 2020-10-09: 80 mg via INTRAVENOUS

## 2020-10-09 MED ORDER — METHOHEXITAL SODIUM 100 MG/10ML IV SOSY
PREFILLED_SYRINGE | INTRAVENOUS | Status: DC | PRN
  Administered 2020-10-09: 80 mg via INTRAVENOUS

## 2020-10-09 NOTE — Anesthesia Preprocedure Evaluation (Signed)
Anesthesia Evaluation  Patient identified by MRN, date of birth, ID band Patient awake    Reviewed: Allergy & Precautions, NPO status , Patient's Chart, lab work & pertinent test results, reviewed documented beta blocker date and time   History of Anesthesia Complications Negative for: history of anesthetic complications  Airway Mallampati: III  TM Distance: >3 FB Neck ROM: Full    Dental no notable dental hx. (+) Chipped   Pulmonary asthma , sleep apnea and Continuous Positive Airway Pressure Ventilation ,           Cardiovascular hypertension, Pt. on medications and Pt. on home beta blockers (-) CAD, (-) Past MI, (-) Cardiac Stents and (-) CABG  - Systolic murmurs    Neuro/Psych neg Seizures PSYCHIATRIC DISORDERS Anxiety Depression Bipolar Disorder  Neuromuscular disease    GI/Hepatic Neg liver ROS, GERD  Medicated,  Endo/Other  diabetes, Well Controlled, Oral Hypoglycemic AgentsHypothyroidism   Renal/GU Renal InsufficiencyRenal disease     Musculoskeletal  (+) Arthritis ,   Abdominal (+) + obese,   Peds  Hematology  (+) Blood dyscrasia, anemia ,   Anesthesia Other Findings Past Medical History: No date: Anxiety No date: Arthritis of knee, left No date: Asthma No date: Bipolar 1 disorder, mixed (HCC) No date: Carpal tunnel syndrome     Comment:  bilateral BILATERAL: Cataract immature MONITORED BY DR GOLDSBOROUGH: Chronic kidney disease (CKD), stage III  (moderate) (HCC) No date: Diabetes mellitus without complication (HCC)     Comment:  no meds.  diet control borderline-  monitored: Elevated blood pressure (not hypertension) No date: Hyperlipidemia No date: Hypertension No date: Hypothyroidism hearing aid- bilateral: Impaired hearing No date: Nocturia No date: OSA on CPAP No date: Seasonal allergies No date: Stress incontinence, female No date: Urgency of urination   Reproductive/Obstetrics                              Anesthesia Physical  Anesthesia Plan  ASA: 3  Anesthesia Plan: General   Post-op Pain Management:    Induction: Intravenous  PONV Risk Score and Plan: 2  Airway Management Planned: Mask  Additional Equipment:   Intra-op Plan:   Post-operative Plan:   Informed Consent: I have reviewed the patients History and Physical, chart, labs and discussed the procedure including the risks, benefits and alternatives for the proposed anesthesia with the patient or authorized representative who has indicated his/her understanding and acceptance.     Dental advisory given  Plan Discussed with: CRNA, Anesthesiologist and Surgeon  Anesthesia Plan Comments: (Patient consented for risks of anesthesia including but not limited to:  - adverse reactions to medications - risk of airway placement if required - damage to eyes, teeth, lips or other oral mucosa - nerve damage due to positioning  - sore throat or hoarseness - Damage to heart, brain, nerves, lungs, other parts of body or loss of life  Patient voiced understanding.)        Anesthesia Quick Evaluation

## 2020-10-09 NOTE — H&P (Signed)
Denise Serrano is an 65 y.o. female.   Chief Complaint: Patient continues to report dysphoric mood with no active suicidal plan HPI: History of depression previous response to ECT  Past Medical History:  Diagnosis Date   Anxiety    Arthritis of knee, left    Asthma    Bipolar 1 disorder, mixed ([place])    Carpal tunnel syndrome    bilateral   Cataract immature BILATERAL   Chronic kidney disease (CKD), stage III (moderate) ([place]) MONITORED BY DR Moshe Cipro   Diabetes mellitus without complication ([place])    no meds.  diet control   Elevated blood pressure (not hypertension) borderline-  monitored   Hyperlipidemia    Hypertension    Hypothyroidism    Impaired hearing hearing aid- bilateral   Nocturia    OSA on CPAP    Seasonal allergies    Stress incontinence, female    Urgency of urination     Past Surgical History:  Procedure Laterality Date   BLADDER SUSPENSION  05/07/2011   Procedure: SPARC PROCEDURE;  Surgeon: Reece Packer, MD;  Location: [facility];  Service: Urology;  Laterality: N/A;  cysto, sparc sling    DX LAPAROSCOPY FOR INFERTILITY  27 YRS AGO   TUBAL LIGATION  60 YRS AGO    Family History  Problem Relation Age of Onset   Heart attack Mother    Heart attack Father    Social History:  reports that she has never smoked. She has never used smokeless tobacco. She reports previous alcohol use. She reports that she does not use drugs.  Allergies:  Allergies  Allergen Reactions   Amoxicillin Diarrhea   Iodine Other (See Comments)    Betadine soap causes her to want to pass out   Penicillin G Diarrhea    (Not in a hospital admission)   Results for orders placed or performed during the hospital encounter of 10/09/20 (from the past 48 hour(s))  Glucose, capillary     Status: Abnormal   Collection Time: 10/09/20 11:21 AM  Result Value Ref Range   Glucose-Capillary 123 (H) 70 - 99 mg/dL    Comment: Glucose reference range applies only to  samples taken after fasting for at least 8 hours.   No results found.  Review of Systems  Constitutional: Negative.   HENT: Negative.    Eyes: Negative.   Respiratory: Negative.    Cardiovascular: Negative.   Gastrointestinal: Negative.   Musculoskeletal: Negative.   Skin: Negative.   Neurological: Negative.   Psychiatric/Behavioral:  Positive for dysphoric mood.    Blood pressure (!) 160/85, pulse 75, temperature 98.1 F (36.7 C), temperature source Oral, resp. rate 15, height '5\' 3"'$  (1.6 m), weight 86.2 kg, SpO2 94 %. Physical Exam Vitals and nursing note reviewed.  Constitutional:      Appearance: She is well-developed.  HENT:     Head: Normocephalic and atraumatic.  Eyes:     Conjunctiva/sclera: Conjunctivae normal.     Pupils: Pupils are equal, round, and reactive to light.  Cardiovascular:     Heart sounds: Normal heart sounds.  Pulmonary:     Effort: Pulmonary effort is normal.  Abdominal:     Palpations: Abdomen is soft.  Musculoskeletal:        General: Normal range of motion.     Cervical back: Normal range of motion.  Skin:    General: Skin is warm and dry.  Neurological:     Mental Status: She is alert.  Psychiatric:  Mood and Affect: Mood is depressed.        Thought Content: Thought content does not include suicidal ideation.     Assessment/Plan Continue treatment today on 3 times a week schedule.  Making sure today to use ketamine to get affective seizure  Alethia Berthold, MD 10/09/2020, 7:11 PM

## 2020-10-09 NOTE — Transfer of Care (Signed)
Immediate Anesthesia Transfer of Care Note  Patient: Denise Serrano  Procedure(s) Performed: ECT TX  Patient Location: PACU  Anesthesia Type:General  Level of Consciousness: drowsy and patient cooperative  Airway & Oxygen Therapy: Patient Spontanous Breathing  Post-op Assessment: Report given to RN and Post -op Vital signs reviewed and stable  Post vital signs: Reviewed and stable  Last Vitals:  Vitals Value Taken Time  BP    Temp    Pulse    Resp    SpO2      Last Pain:  Vitals:   10/09/20 1018  TempSrc: Oral  PainSc: 0-No pain         Complications: No notable events documented.

## 2020-10-09 NOTE — Procedures (Signed)
ECT SERVICES Physician's Interval Evaluation & Treatment Note  Patient Identification: Denise Serrano MRN:  [id number] Date of Evaluation:  10/09/2020 TX #: 5  MADRS:   MMSE:   P.E. Findings:  No change physical exam  Psychiatric Interval Note:  Continues dysphoric  Subjective:  Patient is a 65 y.o. female seen for evaluation for Electroconvulsive Therapy. Depressed no active suicidal ideation  Treatment Summary:   '[x]'$   Right Unilateral             '[]'$  Bilateral   % Energy : 0.3 ms 100%   Impedance: 1450 ohms  Seizure Energy Index: No reading  Postictal Suppression Index: No reading  Seizure Concordance Index: No reading  Medications  Pre Shock: Ketamine 80 mg succinylcholine 80 mg  Post Shock:    Seizure Duration: 17 seconds EMG 20 seconds EEG   Comments: Short seizure but we are doing the best we can follow-up Friday  Lungs:  '[x]'$   Clear to auscultation               '[]'$  Other:   Heart:    '[x]'$   Regular rhythm             '[]'$  irregular rhythm    '[x]'$   Previous H&P reviewed, patient examined and there are NO CHANGES                 '[]'$   Previous H&P reviewed, patient examined and there are changes noted.   Alethia Berthold, MD 7/20/20227:18 PM

## 2020-10-10 NOTE — Anesthesia Postprocedure Evaluation (Signed)
Anesthesia Post Note  Patient: [name], applications  Procedure(s) Performed: ECT TX  Patient location during evaluation: Endoscopy Anesthesia Type: General Level of consciousness: awake and alert Pain management: pain level controlled Vital Signs Assessment: post-procedure vital signs reviewed and stable Respiratory status: spontaneous breathing, nonlabored ventilation, respiratory function stable and patient connected to nasal cannula oxygen Cardiovascular status: blood pressure returned to baseline and stable Postop Assessment: no apparent nausea or vomiting Anesthetic complications: no   No notable events documented.   Last Vitals:  Vitals:   10/09/20 1347 10/09/20 1356  BP: (!) 163/86 (!) 160/85  Pulse: 70 75  Resp: 15 15  Temp:  36.7 C  SpO2: 94%     Last Pain:  Vitals:   10/09/20 1356  TempSrc: Oral  PainSc: 0-No pain                 Precious Haws Casen Pryor

## 2020-10-11 ENCOUNTER — Ambulatory Visit: Payer: Self-pay | Admitting: Anesthesiology

## 2020-10-11 ENCOUNTER — Other Ambulatory Visit: Payer: Self-pay | Admitting: Psychiatry

## 2020-10-11 ENCOUNTER — Other Ambulatory Visit: Payer: Self-pay

## 2020-10-11 ENCOUNTER — Encounter: Payer: Self-pay | Admitting: Anesthesiology

## 2020-10-11 ENCOUNTER — Encounter (HOSPITAL_BASED_OUTPATIENT_CLINIC_OR_DEPARTMENT_OTHER)
Admission: RE | Admit: 2020-10-11 | Discharge: 2020-10-11 | Disposition: A | Discharge status: Home / Self Care | Payer: Medicare Other | Source: Ambulatory Visit

## 2020-10-11 DIAGNOSIS — F332 Major depressive disorder, recurrent severe without psychotic features: Secondary | ICD-10-CM

## 2020-10-11 DIAGNOSIS — F339 Major depressive disorder, recurrent, unspecified: Secondary | ICD-10-CM | POA: Diagnosis not present

## 2020-10-11 LAB — GLUCOSE, CAPILLARY: Glucose-Capillary: 123 mg/dL — ABNORMAL HIGH (ref 70–99)

## 2020-10-11 MED ORDER — KETAMINE HCL 10 MG/ML IJ SOLN
INTRAMUSCULAR | Status: DC | PRN
  Administered 2020-10-11: 80 mg via INTRAVENOUS

## 2020-10-11 MED ORDER — MEPERIDINE HCL 25 MG/ML IJ SOLN: 6.2500 mg | INTRAMUSCULAR | Status: DC | PRN

## 2020-10-11 MED ORDER — DROPERIDOL 2.5 MG/ML IJ SOLN
0.6250 mg | Freq: Once | INTRAMUSCULAR | Status: DC | PRN
  Filled 2020-10-11: qty 2

## 2020-10-11 MED ORDER — PROPOFOL 10 MG/ML IV BOLUS
INTRAVENOUS | Status: AC
  Filled 2020-10-11: qty 20

## 2020-10-11 MED ORDER — ONDANSETRON HCL 4 MG/2ML IJ SOLN: 4.0000 mg | Freq: Once | INTRAMUSCULAR | Status: DC | PRN

## 2020-10-11 MED ORDER — PROPOFOL 10 MG/ML IV BOLUS
INTRAVENOUS | Status: DC | PRN
  Administered 2020-10-11: 20 mg via INTRAVENOUS

## 2020-10-11 MED ORDER — FENTANYL CITRATE (PF) 100 MCG/2ML IJ SOLN: 25.0000 ug | INTRAMUSCULAR | Status: DC | PRN

## 2020-10-11 MED ORDER — SUCCINYLCHOLINE CHLORIDE 200 MG/10ML IV SOSY
PREFILLED_SYRINGE | INTRAVENOUS | Status: AC
  Filled 2020-10-11: qty 10

## 2020-10-11 MED ORDER — SODIUM CHLORIDE 0.9 % IV SOLN: 500.0000 mL | Freq: Once | INTRAVENOUS | Status: AC

## 2020-10-11 MED ORDER — HYDROCODONE-ACETAMINOPHEN 7.5-325 MG PO TABS
1.0000 | ORAL_TABLET | Freq: Once | ORAL | Status: DC | PRN
  Filled 2020-10-11: qty 1

## 2020-10-11 MED ORDER — LACTATED RINGERS IV SOLN: INTRAVENOUS | Status: DC

## 2020-10-11 MED ORDER — KETAMINE HCL 50 MG/5ML IJ SOSY
PREFILLED_SYRINGE | INTRAMUSCULAR | Status: AC
  Filled 2020-10-11: qty 10

## 2020-10-11 NOTE — Anesthesia Preprocedure Evaluation (Signed)
Anesthesia Evaluation  Patient identified by MRN, date of birth, ID band Patient awake    Reviewed: Allergy & Precautions, NPO status , Patient's Chart, lab work & pertinent test results  History of Anesthesia Complications Negative for: history of anesthetic complications  Airway Mallampati: III  TM Distance: >3 FB Neck ROM: Full    Dental no notable dental hx. (+) Teeth Intact   Pulmonary asthma (stable) , sleep apnea ,    Pulmonary exam normal breath sounds clear to auscultation       Cardiovascular Exercise Tolerance: Good METS: 3 - Mets hypertension, negative cardio ROS Normal cardiovascular exam Rhythm:Regular Rate:Normal     Neuro/Psych PSYCHIATRIC DISORDERS Depression Bipolar Disorder  Neuromuscular disease (CTS)    GI/Hepatic Neg liver ROS, GERD  Controlled,  Endo/Other  diabetesHypothyroidism   Renal/GU Renal disease  negative genitourinary   Musculoskeletal  (+) Arthritis ,   Abdominal   Peds  Hematology negative hematology ROS (+)   Anesthesia Other Findings   Reproductive/Obstetrics negative OB ROS                             Anesthesia Physical Anesthesia Plan  ASA: 2  Anesthesia Plan: General   Post-op Pain Management:    Induction: Intravenous  PONV Risk Score and Plan: Ondansetron  Airway Management Planned: Mask  Additional Equipment:   Intra-op Plan:   Post-operative Plan: Extubation in OR  Informed Consent: I have reviewed the patients History and Physical, chart, labs and discussed the procedure including the risks, benefits and alternatives for the proposed anesthesia with the patient or authorized representative who has indicated his/her understanding and acceptance.     Dental advisory given  Plan Discussed with: CRNA  Anesthesia Plan Comments: (Benefits and risk discussed with patient to include death, MI and CVA.  Pt wishes to proceed.)         Anesthesia Quick Evaluation

## 2020-10-11 NOTE — H&P (Signed)
Denise Serrano is an 65 y.o. female.   Chief Complaint: Continues to report depression no real change from baseline no suicidal ideation however HPI: Patient with depression and previous response to ECT  Past Medical History:  Diagnosis Date   Anxiety    Arthritis of knee, left    Asthma    Bipolar 1 disorder, mixed ([place])    Carpal tunnel syndrome    bilateral   Cataract immature BILATERAL   Chronic kidney disease (CKD), stage III (moderate) ([place]) MONITORED BY DR Moshe Cipro   Diabetes mellitus without complication ([place])    no meds.  diet control   Elevated blood pressure (not hypertension) borderline-  monitored   Hyperlipidemia    Hypertension    Hypothyroidism    Impaired hearing hearing aid- bilateral   Nocturia    OSA on CPAP    Seasonal allergies    Stress incontinence, female    Urgency of urination     Past Surgical History:  Procedure Laterality Date   BLADDER SUSPENSION  05/07/2011   Procedure: SPARC PROCEDURE;  Surgeon: Reece Packer, MD;  Location: [facility];  Service: Urology;  Laterality: N/A;  cysto, sparc sling    DX LAPAROSCOPY FOR INFERTILITY  27 YRS AGO   TUBAL LIGATION  5 YRS AGO    Family History  Problem Relation Age of Onset   Heart attack Mother    Heart attack Father    Social History:  reports that she has never smoked. She has never used smokeless tobacco. She reports previous alcohol use. She reports that she does not use drugs.  Allergies:  Allergies  Allergen Reactions   Amoxicillin Diarrhea   Iodine Other (See Comments)    Betadine soap causes her to want to pass out   Penicillin G Diarrhea    (Not in a hospital admission)   Results for orders placed or performed during the hospital encounter of 10/11/20 (from the past 48 hour(s))  Glucose, capillary     Status: Abnormal   Collection Time: 10/11/20 11:35 AM  Result Value Ref Range   Glucose-Capillary 123 (H) 70 - 99 mg/dL    Comment: Glucose reference  range applies only to samples taken after fasting for at least 8 hours.   No results found.  Review of Systems  Constitutional: Negative.   HENT: Negative.    Eyes: Negative.   Respiratory: Negative.    Cardiovascular: Negative.   Gastrointestinal: Negative.   Musculoskeletal: Negative.   Skin: Negative.   Neurological: Negative.   Psychiatric/Behavioral:  Positive for dysphoric mood. Negative for suicidal ideas.    Blood pressure (!) 152/80, pulse 62, temperature (!) 97.1 F (36.2 C), temperature source Tympanic, resp. rate 18, height '5\' 3"'$  (1.6 m), weight 86.2 kg, SpO2 92 %. Physical Exam Vitals and nursing note reviewed.  Constitutional:      Appearance: She is well-developed.  HENT:     Head: Normocephalic and atraumatic.  Eyes:     Conjunctiva/sclera: Conjunctivae normal.     Pupils: Pupils are equal, round, and reactive to light.  Cardiovascular:     Heart sounds: Normal heart sounds.  Pulmonary:     Effort: Pulmonary effort is normal.  Abdominal:     Palpations: Abdomen is soft.  Musculoskeletal:        General: Normal range of motion.     Cervical back: Normal range of motion.  Skin:    General: Skin is warm and dry.  Neurological:  General: No focal deficit present.     Mental Status: She is alert.  Psychiatric:        Mood and Affect: Mood is depressed.        Speech: Speech is delayed.        Behavior: Behavior is cooperative.        Thought Content: Thought content does not include suicidal ideation.     Assessment/Plan Treatment today with continued plan for treatment into next week.  Discussed with patient the option of switching to bilateral treatment for better effectiveness today which she was agreeable to  Alethia Berthold, MD 10/11/2020, 4:44 PM

## 2020-10-11 NOTE — Procedures (Signed)
ECT SERVICES Physician's Interval Evaluation & Treatment Note  Patient Identification: Denise Serrano MRN:  [id number] Date of Evaluation:  10/11/2020 TX #: 6  MADRS:   MMSE:   P.E. Findings:  No change to physical exam  Psychiatric Interval Note:  Still reporting dysphoric mood  Subjective:  Patient is a 65 y.o. female seen for evaluation for Electroconvulsive Therapy. Still depressed low energy but not suicidal or psychotic  Treatment Summary:   '[]'$   Right Unilateral             '[x]'$  Bilateral   % Energy : 1.0 ms 100%   Impedance: 2140 ohms  Seizure Energy Index: 1800 V squared  Postictal Suppression Index: Less than 10% although this is probably not accurate  Seizure Concordance Index: 91%  Medications  Pre Shock: Ketamine 80 mg succinylcholine 80 mg  Post Shock:    Seizure Duration: 19 seconds EMG 29 seconds EEG   Comments: Follow-up and next week now with bilateral treatment at the patient's agreement improved efficacy.  Lungs:  '[x]'$   Clear to auscultation               '[]'$  Other:   Heart:    '[x]'$   Regular rhythm             '[]'$  irregular rhythm    '[x]'$   Previous H&P reviewed, patient examined and there are NO CHANGES                 '[]'$   Previous H&P reviewed, patient examined and there are changes noted.   Alethia Berthold, MD 7/22/20224:56 PM

## 2020-10-11 NOTE — Transfer of Care (Signed)
Immediate Anesthesia Transfer of Care Note  Patient: Denise Serrano  Procedure(s) Performed: ECT TX  Patient Location: Nursing Unit  Anesthesia Type:General  Level of Consciousness: awake, alert  and oriented  Airway & Oxygen Therapy: Patient Spontanous Breathing and Patient connected to face mask oxygen  Post-op Assessment: Report given to RN and Post -op Vital signs reviewed and stable  Post vital signs: Reviewed and stable  Last Vitals:  Vitals Value Taken Time  BP 150/70 10/11/20 1345  Temp 36.2 C 10/11/20 1345  Pulse 70 10/11/20 1351  Resp 11 10/11/20 1351  SpO2 95 % 10/11/20 1351  Vitals shown include unvalidated device data.  Last Pain:  Vitals:   10/11/20 1345  TempSrc:   PainSc: Asleep         Complications: No notable events documented.

## 2020-10-12 NOTE — Anesthesia Postprocedure Evaluation (Signed)
Anesthesia Post Note  Patient: [name], applications  Procedure(s) Performed: ECT TX  Patient location during evaluation: PACU Anesthesia Type: General Level of consciousness: awake and alert Pain management: pain level controlled Vital Signs Assessment: post-procedure vital signs reviewed and stable Respiratory status: spontaneous breathing, nonlabored ventilation, respiratory function stable and patient connected to nasal cannula oxygen Cardiovascular status: blood pressure returned to baseline and stable Postop Assessment: no apparent nausea or vomiting Anesthetic complications: no   No notable events documented.   Last Vitals:  Vitals:   10/11/20 1415 10/11/20 1445  BP: (!) 158/83 (!) 152/80  Pulse: 63 62  Resp: 15 18  Temp:  (!) 36.2 C  SpO2: 92%     Last Pain:  Vitals:   10/11/20 1445  TempSrc: Tympanic  PainSc: 0-No pain                 Tonny Bollman

## 2020-10-14 ENCOUNTER — Other Ambulatory Visit: Payer: Self-pay

## 2020-10-14 ENCOUNTER — Encounter: Payer: Self-pay | Admitting: Anesthesiology

## 2020-10-14 ENCOUNTER — Other Ambulatory Visit: Payer: Self-pay | Admitting: Psychiatry

## 2020-10-14 ENCOUNTER — Encounter (HOSPITAL_BASED_OUTPATIENT_CLINIC_OR_DEPARTMENT_OTHER)
Admission: RE | Admit: 2020-10-14 | Discharge: 2020-10-14 | Disposition: A | Discharge status: Home / Self Care | Payer: Medicare Other | Source: Ambulatory Visit | Attending: Psychiatry | Admitting: Psychiatry

## 2020-10-14 ENCOUNTER — Ambulatory Visit: Payer: Self-pay | Admitting: Anesthesiology

## 2020-10-14 DIAGNOSIS — F339 Major depressive disorder, recurrent, unspecified: Secondary | ICD-10-CM | POA: Diagnosis not present

## 2020-10-14 DIAGNOSIS — F332 Major depressive disorder, recurrent severe without psychotic features: Secondary | ICD-10-CM

## 2020-10-14 LAB — GLUCOSE, CAPILLARY: Glucose-Capillary: 134 mg/dL — ABNORMAL HIGH (ref 70–99)

## 2020-10-14 MED ORDER — LABETALOL HCL 5 MG/ML IV SOLN
INTRAVENOUS | Status: AC
  Filled 2020-10-14: qty 4

## 2020-10-14 MED ORDER — KETAMINE HCL 50 MG/ML IJ SOLN
INTRAMUSCULAR | Status: AC
  Filled 2020-10-14: qty 1

## 2020-10-14 MED ORDER — SUCCINYLCHOLINE CHLORIDE 200 MG/10ML IV SOSY
PREFILLED_SYRINGE | INTRAVENOUS | Status: AC
  Filled 2020-10-14: qty 10

## 2020-10-14 MED ORDER — LABETALOL HCL 5 MG/ML IV SOLN
INTRAVENOUS | Status: DC | PRN
  Administered 2020-10-14: 5 mg via INTRAVENOUS

## 2020-10-14 MED ORDER — KETAMINE HCL 10 MG/ML IJ SOLN
INTRAMUSCULAR | Status: DC | PRN
Start: 2020-10-14 — End: 2020-10-14
  Administered 2020-10-14: 80 mg via INTRAVENOUS

## 2020-10-14 MED ORDER — SUCCINYLCHOLINE CHLORIDE 20 MG/ML IJ SOLN
INTRAMUSCULAR | Status: DC | PRN
  Administered 2020-10-14: 80 mg via INTRAVENOUS

## 2020-10-14 MED ORDER — SODIUM CHLORIDE 0.9 % IV SOLN
INTRAVENOUS | Status: DC | PRN
Start: 2020-10-14 — End: 2020-10-14

## 2020-10-14 MED ORDER — SODIUM CHLORIDE 0.9 % IV SOLN
500.0000 mL | Freq: Once | INTRAVENOUS | Status: AC
  Administered 2020-10-14: 500 mL via INTRAVENOUS

## 2020-10-14 NOTE — Transfer of Care (Signed)
Immediate Anesthesia Transfer of Care Note  Patient: Denise Serrano  Procedure(s) Performed: ECT TX  Patient Location: PACU  Anesthesia Type:General  Level of Consciousness: awake  Airway & Oxygen Therapy: Patient Spontanous Breathing and Patient connected to face mask oxygen  Post-op Assessment: Report given to RN and Post -op Vital signs reviewed and stable  Post vital signs: Reviewed  Last Vitals:  Vitals Value Taken Time  BP 184/95 10/14/20 1322  Temp 36.8 C 10/14/20 1316  Pulse 72 10/14/20 1323  Resp 13 10/14/20 1323  SpO2 97 % 10/14/20 1323  Vitals shown include unvalidated device data.  Last Pain:  Vitals:   10/14/20 1316  TempSrc:   PainSc: 0-No pain         Complications: No notable events documented.

## 2020-10-14 NOTE — Procedures (Signed)
ECT SERVICES Physician's Interval Evaluation & Treatment Note  Patient Identification: Denise Serrano MRN:  [id number] Date of Evaluation:  10/14/2020 TX #: 7  MADRS:   MMSE:   P.E. Findings:  No change to physical exam  Psychiatric Interval Note:  Subjectively reports feeling somewhat better.  Smiling more.  More activity this weekend  Subjective:  Patient is a 65 y.o. female seen for evaluation for Electroconvulsive Therapy. Feels mood is improved  Treatment Summary:   '[]'$   Right Unilateral             '[x]'$  Bilateral   % Energy : 1.0 ms 100%   Impedance: 2130 ohms  Seizure Energy Index: 4536 V squared  Postictal Suppression Index: 77%   Seizure Concordance Index: 86%  Medications  Pre Shock: Ketamine 80 mg succinylcholine 80 mg  Post Shock:    Seizure Duration: 26 seconds EMG 32 seconds EEG   Comments: Improved seizure quality and some signs of better response.  Plan is to continue bilateral this week  Lungs:  '[x]'$   Clear to auscultation               '[]'$  Other:   Heart:    '[x]'$   Regular rhythm             '[]'$  irregular rhythm    '[x]'$   Previous H&P reviewed, patient examined and there are NO CHANGES                 '[]'$   Previous H&P reviewed, patient examined and there are changes noted.   Alethia Berthold, MD 7/25/20225:15 PM

## 2020-10-14 NOTE — H&P (Signed)
Denise Serrano is an 65 y.o. female.   Chief Complaint: Patient reported subjectively feeling somewhat better over the weekend HPI: History of prior response to ECT  Past Medical History:  Diagnosis Date   Anxiety    Arthritis of knee, left    Asthma    Bipolar 1 disorder, mixed ([place])    Carpal tunnel syndrome    bilateral   Cataract immature BILATERAL   Chronic kidney disease (CKD), stage III (moderate) ([place]) MONITORED BY DR Moshe Cipro   Diabetes mellitus without complication ([place])    no meds.  diet control   Elevated blood pressure (not hypertension) borderline-  monitored   Hyperlipidemia    Hypertension    Hypothyroidism    Impaired hearing hearing aid- bilateral   Nocturia    OSA on CPAP    Seasonal allergies    Stress incontinence, female    Urgency of urination     Past Surgical History:  Procedure Laterality Date   BLADDER SUSPENSION  05/07/2011   Procedure: SPARC PROCEDURE;  Surgeon: Reece Packer, MD;  Location: [facility];  Service: Urology;  Laterality: N/A;  cysto, sparc sling    DX LAPAROSCOPY FOR INFERTILITY  27 YRS AGO   TUBAL LIGATION  18 YRS AGO    Family History  Problem Relation Age of Onset   Heart attack Mother    Heart attack Father    Social History:  reports that she has never smoked. She has never used smokeless tobacco. She reports previous alcohol use. She reports that she does not use drugs.  Allergies:  Allergies  Allergen Reactions   Amoxicillin Diarrhea   Iodine Other (See Comments)    Betadine soap causes her to want to pass out   Penicillin G Diarrhea    (Not in a hospital admission)   Results for orders placed or performed during the hospital encounter of 10/14/20 (from the past 48 hour(s))  Glucose, capillary     Status: Abnormal   Collection Time: 10/14/20 11:26 AM  Result Value Ref Range   Glucose-Capillary 134 (H) 70 - 99 mg/dL    Comment: Glucose reference range applies only to samples taken  after fasting for at least 8 hours.   No results found.  Review of Systems  Constitutional: Negative.   HENT: Negative.    Eyes: Negative.   Respiratory: Negative.    Cardiovascular: Negative.   Gastrointestinal: Negative.   Musculoskeletal: Negative.   Skin: Negative.   Neurological: Negative.   Psychiatric/Behavioral:  Positive for dysphoric mood.    Blood pressure (!) 150/80, pulse 68, temperature 98.2 F (36.8 C), temperature source Oral, resp. rate 14, height '5\' 3"'$  (1.6 m), weight 86.2 kg, SpO2 (!) 89 %. Physical Exam Vitals and nursing note reviewed.  Constitutional:      Appearance: She is well-developed.  HENT:     Head: Normocephalic and atraumatic.  Eyes:     Conjunctiva/sclera: Conjunctivae normal.     Pupils: Pupils are equal, round, and reactive to light.  Cardiovascular:     Heart sounds: Normal heart sounds.  Pulmonary:     Effort: Pulmonary effort is normal.  Abdominal:     Palpations: Abdomen is soft.  Musculoskeletal:        General: Normal range of motion.     Cervical back: Normal range of motion.  Skin:    General: Skin is warm and dry.  Neurological:     General: No focal deficit present.  Mental Status: She is alert.  Psychiatric:        Attention and Perception: She is inattentive.        Mood and Affect: Affect is blunt.        Speech: Speech is delayed.        Behavior: Behavior is cooperative.        Thought Content: Thought content normal.        Cognition and Memory: Cognition normal.        Judgment: Judgment normal.     Assessment/Plan We have now done bilateral treatment on Friday and today with good tolerance and some early signs of improvement.  Plan to continue bilateral treatment this week  Alethia Berthold, MD 10/14/2020, 5:14 PM

## 2020-10-14 NOTE — Anesthesia Preprocedure Evaluation (Signed)
Anesthesia Evaluation  Patient identified by MRN, date of birth, ID band Patient awake    Reviewed: Allergy & Precautions, NPO status , Patient's Chart, lab work & pertinent test results  History of Anesthesia Complications Negative for: history of anesthetic complications  Airway Mallampati: III  TM Distance: >3 FB Neck ROM: Full    Dental no notable dental hx. (+) Teeth Intact   Pulmonary asthma (stable) , sleep apnea ,    Pulmonary exam normal breath sounds clear to auscultation       Cardiovascular Exercise Tolerance: Good METS: 3 - Mets hypertension, negative cardio ROS Normal cardiovascular exam Rhythm:Regular Rate:Normal     Neuro/Psych PSYCHIATRIC DISORDERS Depression Bipolar Disorder  Neuromuscular disease (CTS)    GI/Hepatic Neg liver ROS, GERD  Controlled,  Endo/Other  diabetesHypothyroidism   Renal/GU Renal disease  negative genitourinary   Musculoskeletal  (+) Arthritis ,   Abdominal   Peds  Hematology negative hematology ROS (+)   Anesthesia Other Findings   Reproductive/Obstetrics negative OB ROS                             Anesthesia Physical  Anesthesia Plan  ASA: 2  Anesthesia Plan: General   Post-op Pain Management:    Induction: Intravenous  PONV Risk Score and Plan: Ondansetron  Airway Management Planned: Mask  Additional Equipment:   Intra-op Plan:   Post-operative Plan: Extubation in OR  Informed Consent: I have reviewed the patients History and Physical, chart, labs and discussed the procedure including the risks, benefits and alternatives for the proposed anesthesia with the patient or authorized representative who has indicated his/her understanding and acceptance.     Dental advisory given  Plan Discussed with: CRNA  Anesthesia Plan Comments: (Benefits and risk discussed with patient to include death, MI and CVA.  Pt wishes to proceed.)         Anesthesia Quick Evaluation

## 2020-10-14 NOTE — Anesthesia Postprocedure Evaluation (Signed)
Anesthesia Post Note  Patient: [name], applications  Procedure(s) Performed: ECT TX  Patient location during evaluation: PACU Anesthesia Type: General Level of consciousness: awake and alert Pain management: pain level controlled Vital Signs Assessment: post-procedure vital signs reviewed and stable Respiratory status: spontaneous breathing, nonlabored ventilation, respiratory function stable and patient connected to nasal cannula oxygen Cardiovascular status: blood pressure returned to baseline and stable Postop Assessment: no apparent nausea or vomiting Anesthetic complications: no   No notable events documented.   Last Vitals:  Vitals:   10/14/20 1350 10/14/20 1355  BP: (!) 168/86   Pulse: 70 70  Resp: 15 12  Temp: 36.8 C   SpO2: 93% (!) 89%    Last Pain:  Vitals:   10/14/20 1350  TempSrc:   PainSc: 0-No pain                 Martha Clan

## 2020-10-15 ENCOUNTER — Other Ambulatory Visit: Payer: Self-pay | Admitting: Psychiatry

## 2020-10-16 ENCOUNTER — Other Ambulatory Visit: Payer: Self-pay

## 2020-10-16 ENCOUNTER — Encounter: Payer: Self-pay | Admitting: Certified Registered Nurse Anesthetist

## 2020-10-16 ENCOUNTER — Encounter (HOSPITAL_BASED_OUTPATIENT_CLINIC_OR_DEPARTMENT_OTHER)
Admission: RE | Admit: 2020-10-16 | Discharge: 2020-10-16 | Disposition: A | Discharge status: Home / Self Care | Payer: Medicare Other | Source: Ambulatory Visit | Attending: Psychiatry | Admitting: Psychiatry

## 2020-10-16 DIAGNOSIS — F339 Major depressive disorder, recurrent, unspecified: Secondary | ICD-10-CM | POA: Diagnosis not present

## 2020-10-16 DIAGNOSIS — F332 Major depressive disorder, recurrent severe without psychotic features: Secondary | ICD-10-CM

## 2020-10-16 LAB — GLUCOSE, CAPILLARY: Glucose-Capillary: 138 mg/dL — ABNORMAL HIGH (ref 70–99)

## 2020-10-16 MED ORDER — SUCCINYLCHOLINE CHLORIDE 200 MG/10ML IV SOSY
PREFILLED_SYRINGE | INTRAVENOUS | Status: DC | PRN
Start: 2020-10-16 — End: 2020-10-16
  Administered 2020-10-16: 120 mg via INTRAVENOUS

## 2020-10-16 MED ORDER — SUCCINYLCHOLINE CHLORIDE 200 MG/10ML IV SOSY
PREFILLED_SYRINGE | INTRAVENOUS | Status: AC
  Filled 2020-10-16: qty 10

## 2020-10-16 MED ORDER — SODIUM CHLORIDE 0.9 % IV SOLN: 500.0000 mL | Freq: Once | INTRAVENOUS | Status: AC

## 2020-10-16 MED ORDER — MIDAZOLAM HCL 2 MG/2ML IJ SOLN
INTRAMUSCULAR | Status: DC | PRN
  Administered 2020-10-16: 2 mg via INTRAVENOUS

## 2020-10-16 MED ORDER — MIDAZOLAM HCL 2 MG/2ML IJ SOLN
INTRAMUSCULAR | Status: AC
  Filled 2020-10-16: qty 2

## 2020-10-16 MED ORDER — LABETALOL HCL 5 MG/ML IV SOLN
INTRAVENOUS | Status: DC | PRN
  Administered 2020-10-16 (×2): 5 mg via INTRAVENOUS

## 2020-10-16 MED ORDER — KETAMINE HCL 10 MG/ML IJ SOLN
INTRAMUSCULAR | Status: DC | PRN
  Administered 2020-10-16: 80 mg via INTRAVENOUS

## 2020-10-16 MED ORDER — KETAMINE HCL 50 MG/ML IJ SOLN
INTRAMUSCULAR | Status: AC
  Filled 2020-10-16: qty 1

## 2020-10-16 NOTE — Procedures (Signed)
ECT SERVICES Physician's Interval Evaluation & Treatment Note  Patient Identification: Denise Serrano MRN:  [id number] Date of Evaluation:  10/16/2020 TX #: #[id number]  MADRS:   MMSE:   P.E. Findings:  No change to physical exam.  Neatly groomed.  Psychiatric Interval Note:  Patient subjectively does not feel any better right now.  She does mention some confusion at times after her last treatment.  Subjective:  Patient is a 65 y.o. female seen for evaluation for Electroconvulsive Therapy. Continues with mild depression  Treatment Summary:   '[]'$   Right Unilateral             '[x]'$  Bilateral   % Energy : 1.0 ms 100%   Impedance: 2250 ohms  Seizure Energy Index: No reading  Postictal Suppression Index: No radiating  Seizure Concordance Index: 90%  Medications  Pre Shock: Ketamine 80 mg succinylcholine 80 mg  Post Shock: None  Seizure Duration: 29 seconds EMG 41 seconds EEG   Comments: Good quality seizure again.  This is her second bilateral treatment.  Afterwards she was very confused and seemed to be feeling worse than usual.  We are strongly going to consider discontinuing at this time if she remains confused tomorrow  Lungs:  '[x]'$   Clear to auscultation               '[]'$  Other:   Heart:    '[x]'$   Regular rhythm             '[]'$  irregular rhythm    '[x]'$   Previous H&P reviewed, patient examined and there are NO CHANGES                 '[]'$   Previous H&P reviewed, patient examined and there are changes noted.   Alethia Berthold, MD 7/27/20224:38 PM

## 2020-10-16 NOTE — Transfer of Care (Signed)
Immediate Anesthesia Transfer of Care Note  Patient: Denise Serrano  Procedure(s) Performed: ECT TX  Patient Location: PACU  Anesthesia Type:General  Level of Consciousness: awake and alert   Airway & Oxygen Therapy: Patient Spontanous Breathing and Patient connected to face mask oxygen  Post-op Assessment: Report given to RN and Post -op Vital signs reviewed and stable  Post vital signs: Reviewed and stable  Last Vitals:  Vitals Value Taken Time  BP    Temp    Pulse 73 10/16/20 1342  Resp 14 10/16/20 1342  SpO2 92 % 10/16/20 1342  Vitals shown include unvalidated device data.  Last Pain:  Vitals:   10/16/20 1203  TempSrc:   PainSc: 0-No pain         Complications: No notable events documented.

## 2020-10-16 NOTE — Anesthesia Preprocedure Evaluation (Signed)
Anesthesia Evaluation  Patient identified by MRN, date of birth, ID band Patient awake    Reviewed: Allergy & Precautions, NPO status , Patient's Chart, lab work & pertinent test results  Airway Mallampati: III  TM Distance: >3 FB Neck ROM: full    Dental   Pulmonary neg pulmonary ROS,    Pulmonary exam normal        Cardiovascular hypertension, negative cardio ROS Normal cardiovascular exam     Neuro/Psych PSYCHIATRIC DISORDERS Anxiety Bipolar Disorder  Neuromuscular disease    GI/Hepatic Neg liver ROS, GERD  ,  Endo/Other  diabetesHypothyroidism   Renal/GU Renal disease  negative genitourinary   Musculoskeletal   Abdominal   Peds  Hematology  (+) Blood dyscrasia, anemia ,   Anesthesia Other Findings Past Medical History: No date: Anxiety No date: Arthritis of knee, left No date: Asthma No date: Bipolar 1 disorder, mixed ([facility]) No date: Carpal tunnel syndrome     Comment:  bilateral BILATERAL: Cataract immature MONITORED BY DR GOLDSBOROUGH: Chronic kidney disease (CKD), stage III  (moderate) ([facility]) No date: Diabetes mellitus without complication ([facility])     Comment:  no meds.  diet control borderline-  monitored: Elevated blood pressure (not hypertension) No date: Hyperlipidemia No date: Hypertension No date: Hypothyroidism hearing aid- bilateral: Impaired hearing No date: Nocturia No date: OSA on CPAP No date: Seasonal allergies No date: Stress incontinence, female No date: Urgency of urination  Past Surgical History: 05/07/2011: BLADDER SUSPENSION     Comment:  Procedure: [place] PROCEDURE;  Surgeon: Reece Packer, MD;  Location: [facility];                Service: Urology;  Laterality: N/A;  cysto, sparc sling  27 YRS AGO: DX LAPAROSCOPY FOR INFERTILITY 20 YRS AGO: TUBAL LIGATION     Reproductive/Obstetrics negative OB ROS                              Anesthesia Physical Anesthesia Plan  ASA: 2  Anesthesia Plan: General   Post-op Pain Management:    Induction: Intravenous  PONV Risk Score and Plan: Propofol infusion and TIVA  Airway Management Planned: Natural Airway and Nasal Cannula  Additional Equipment:   Intra-op Plan:   Post-operative Plan:   Informed Consent: I have reviewed the patients History and Physical, chart, labs and discussed the procedure including the risks, benefits and alternatives for the proposed anesthesia with the patient or authorized representative who has indicated his/her understanding and acceptance.     Dental Advisory Given  Plan Discussed with: Anesthesiologist, CRNA and Surgeon  Anesthesia Plan Comments: (Patient consented for risks of anesthesia including but not limited to:  - adverse reactions to medications - risk of airway placement if required - damage to eyes, teeth, lips or other oral mucosa - nerve damage due to positioning  - sore throat or hoarseness - Damage to heart, brain, nerves, lungs, other parts of body or loss of life  Patient voiced understanding.)        Anesthesia Quick Evaluation

## 2020-10-16 NOTE — H&P (Signed)
Denise Serrano is an 65 y.o. female.   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  diet control   Elevated blood pressure (not hypertension) borderline-  monitored   Hyperlipidemia    Hypertension    Hypothyroidism    Impaired hearing hearing aid- bilateral   Nocturia    OSA on CPAP    Seasonal allergies    Stress incontinence, female    Urgency of urination     Past Surgical History:  Procedure Laterality Date   BLADDER SUSPENSION  05/07/2011   Procedure: SPARC PROCEDURE;  Surgeon: Reece Packer, MD;  Location: [facility];  Service: Urology;  Laterality: N/A;  cysto, sparc sling    DX LAPAROSCOPY FOR INFERTILITY  27 YRS AGO   TUBAL LIGATION  51 YRS AGO    Family History  Problem Relation Age of Onset   Heart attack Mother    Heart attack Father    Social History:  reports that she has never smoked. She has never used smokeless tobacco. She reports previous alcohol use. She reports that she does not use drugs.  Allergies:  Allergies  Allergen Reactions   Amoxicillin Diarrhea   Iodine Other (See Comments)    Betadine soap causes her to want to pass out   Penicillin G Diarrhea    (Not in a hospital admission)   Results for orders placed or performed during the hospital encounter of 10/16/20 (from the past 48 hour(s))  Glucose, capillary     Status: Abnormal   Collection Time: 10/16/20 11:44 AM  Result Value Ref  Range   Glucose-Capillary 138 (H) 70 - 99 mg/dL    Comment: Glucose reference range applies only to samples taken after fasting for at least 8 hours.   No results found.  Review of Systems  Constitutional: Negative.   HENT: Negative.    Eyes: Negative.   Respiratory: Negative.    Cardiovascular: Negative.   Gastrointestinal: Negative.   Musculoskeletal: Negative.   Skin: Negative.   Neurological: Negative.   Psychiatric/Behavioral:  Positive for confusion, decreased concentration and dysphoric mood. Negative for agitation, behavioral problems, self-injury and suicidal ideas.    Blood pressure 124/75, pulse 60, temperature 98.2 F (36.8 C), temperature source Oral, resp. rate 16, height 5' (1.524 m), weight 86.2 kg, SpO2 95 %. Physical Exam Vitals and nursing note reviewed.  Constitutional:      Appearance: She is well-developed.  HENT:     Head: Normocephalic and atraumatic.  Eyes:     Conjunctiva/sclera: Conjunctivae normal.     Pupils: Pupils are equal, round, and reactive to light.  Cardiovascular:     Heart sounds: Normal heart sounds.  Pulmonary:     Effort: Pulmonary effort is normal.  Abdominal:     Palpations: Abdomen is soft.  Musculoskeletal:        General: Normal range of motion.     Cervical back: Normal  range of motion.  Skin:    General: Skin is warm and dry.  Neurological:     General: No focal deficit present.     Mental Status: She is alert.  Psychiatric:        Attention and Perception: Attention normal.        Mood and Affect: Mood is depressed.        Speech: Speech is delayed.        Behavior: Behavior is slowed.        Thought Content: Thought content does not include homicidal or suicidal ideation.        Cognition and Memory: Memory is impaired.     Assessment/Plan Continue today on Friday and then reassess whether we are seeing improvement with bilateral treatment.  Alethia Berthold, MD 10/16/2020, 12:17 PM

## 2020-10-16 NOTE — Anesthesia Procedure Notes (Signed)
Procedure Name: General with mask airway Date/Time: 10/16/2020 1:21 PM Performed by: Tollie Eth, CRNA Pre-anesthesia Checklist: Patient identified, Emergency Drugs available, Suction available and Patient being monitored Patient Re-evaluated:Patient Re-evaluated prior to induction Oxygen Delivery Method: Circle system utilized Preoxygenation: Pre-oxygenation with 100% oxygen Induction Type: IV induction Ventilation: Mask ventilation without difficulty and Mask ventilation throughout procedure Airway Equipment and Method: Bite block Placement Confirmation: positive ETCO2 Dental Injury: Teeth and Oropharynx as per pre-operative assessment

## 2020-10-16 NOTE — Anesthesia Postprocedure Evaluation (Signed)
Anesthesia Post Note  Patient: [name], applications  Procedure(s) Performed: ECT TX  Patient location during evaluation: PACU Anesthesia Type: General Level of consciousness: awake and alert Pain management: pain level controlled Vital Signs Assessment: post-procedure vital signs reviewed and stable Respiratory status: spontaneous breathing, nonlabored ventilation, respiratory function stable and patient connected to nasal cannula oxygen Cardiovascular status: blood pressure returned to baseline and stable Postop Assessment: no apparent nausea or vomiting Anesthetic complications: no   No notable events documented.   Last Vitals:  Vitals:   10/16/20 1201  BP: 124/75  Pulse: 60  Resp: 16  Temp: 36.8 C  SpO2: 95%    Last Pain:  Vitals:   10/16/20 1203  TempSrc:   PainSc: 0-No pain                 [name]

## 2020-10-18 ENCOUNTER — Encounter: Payer: Self-pay | Admitting: Certified Registered Nurse Anesthetist

## 2020-10-18 ENCOUNTER — Other Ambulatory Visit: Payer: Self-pay

## 2020-10-18 ENCOUNTER — Encounter (HOSPITAL_BASED_OUTPATIENT_CLINIC_OR_DEPARTMENT_OTHER)
Admission: RE | Admit: 2020-10-18 | Discharge: 2020-10-18 | Disposition: A | Discharge status: Home / Self Care | Payer: Medicare Other | Source: Ambulatory Visit | Attending: Psychiatry | Admitting: Psychiatry

## 2020-10-18 ENCOUNTER — Other Ambulatory Visit: Payer: Self-pay | Admitting: Psychiatry

## 2020-10-18 DIAGNOSIS — F332 Major depressive disorder, recurrent severe without psychotic features: Secondary | ICD-10-CM | POA: Diagnosis not present

## 2020-10-18 DIAGNOSIS — F339 Major depressive disorder, recurrent, unspecified: Secondary | ICD-10-CM | POA: Diagnosis not present

## 2020-10-18 LAB — GLUCOSE, CAPILLARY: Glucose-Capillary: 131 mg/dL — ABNORMAL HIGH (ref 70–99)

## 2020-10-18 MED ORDER — KETAMINE HCL 10 MG/ML IJ SOLN
INTRAMUSCULAR | Status: DC | PRN
  Administered 2020-10-18: 80 mg via INTRAVENOUS

## 2020-10-18 MED ORDER — KETAMINE HCL 50 MG/ML IJ SOLN
INTRAMUSCULAR | Status: AC
  Filled 2020-10-18: qty 1

## 2020-10-18 MED ORDER — GLYCOPYRROLATE 0.2 MG/ML IJ SOLN
0.1000 mg | Freq: Once | INTRAMUSCULAR | Status: AC
  Administered 2020-10-18: 0.1 mg via INTRAVENOUS

## 2020-10-18 MED ORDER — MIDAZOLAM HCL 2 MG/2ML IJ SOLN
INTRAMUSCULAR | Status: AC
  Filled 2020-10-18: qty 2

## 2020-10-18 MED ORDER — SODIUM CHLORIDE 0.9 % IV SOLN: INTRAVENOUS | Status: DC | PRN

## 2020-10-18 MED ORDER — SUCCINYLCHOLINE CHLORIDE 200 MG/10ML IV SOSY
PREFILLED_SYRINGE | INTRAVENOUS | Status: DC | PRN
  Administered 2020-10-18: 120 mg via INTRAVENOUS

## 2020-10-18 MED ORDER — SODIUM CHLORIDE 0.9 % IV SOLN
500.0000 mL | Freq: Once | INTRAVENOUS | Status: AC
Start: 2020-10-18 — End: 2020-10-18
  Administered 2020-10-18: 500 mL via INTRAVENOUS

## 2020-10-18 MED ORDER — GLYCOPYRROLATE 0.2 MG/ML IJ SOLN
INTRAMUSCULAR | Status: AC
  Filled 2020-10-18: qty 1

## 2020-10-18 MED ORDER — LABETALOL HCL 5 MG/ML IV SOLN
INTRAVENOUS | Status: DC | PRN
  Administered 2020-10-18: 10 mg via INTRAVENOUS

## 2020-10-18 MED ORDER — SUCCINYLCHOLINE CHLORIDE 200 MG/10ML IV SOSY
PREFILLED_SYRINGE | INTRAVENOUS | Status: AC
  Filled 2020-10-18: qty 10

## 2020-10-18 NOTE — Anesthesia Preprocedure Evaluation (Signed)
Anesthesia Evaluation  Patient identified by MRN, date of birth, ID band Patient awake    Reviewed: Allergy & Precautions, NPO status , Patient's Chart, lab work & pertinent test results  History of Anesthesia Complications Negative for: history of anesthetic complications  Airway Mallampati: III  TM Distance: >3 FB Neck ROM: full    Dental   Pulmonary asthma , sleep apnea ,    Pulmonary exam normal        Cardiovascular hypertension, negative cardio ROS Normal cardiovascular exam     Neuro/Psych PSYCHIATRIC DISORDERS Anxiety Bipolar Disorder  Neuromuscular disease    GI/Hepatic Neg liver ROS, GERD  ,  Endo/Other  diabetesHypothyroidism   Renal/GU Renal disease  negative genitourinary   Musculoskeletal   Abdominal   Peds  Hematology  (+) Blood dyscrasia, anemia ,   Anesthesia Other Findings Past Medical History: No date: Anxiety No date: Arthritis of knee, left No date: Asthma No date: Bipolar 1 disorder, mixed ([facility]) No date: Carpal tunnel syndrome     Comment:  bilateral BILATERAL: Cataract immature MONITORED BY DR GOLDSBOROUGH: Chronic kidney disease (CKD), stage III  (moderate) ([facility]) No date: Diabetes mellitus without complication ([facility])     Comment:  no meds.  diet control borderline-  monitored: Elevated blood pressure (not hypertension) No date: Hyperlipidemia No date: Hypertension No date: Hypothyroidism hearing aid- bilateral: Impaired hearing No date: Nocturia No date: OSA on CPAP No date: Seasonal allergies No date: Stress incontinence, female No date: Urgency of urination  Past Surgical History: 05/07/2011: BLADDER SUSPENSION     Comment:  Procedure: [place] PROCEDURE;  Surgeon: Reece Packer, MD;  Location: [facility];                Service: Urology;  Laterality: N/A;  cysto, sparc sling  27 YRS AGO: DX LAPAROSCOPY FOR INFERTILITY 20 YRS AGO: TUBAL  LIGATION     Reproductive/Obstetrics negative OB ROS                             Anesthesia Physical  Anesthesia Plan  ASA: 2  Anesthesia Plan: General   Post-op Pain Management:    Induction: Intravenous  PONV Risk Score and Plan: Propofol infusion and TIVA  Airway Management Planned: Natural Airway and Nasal Cannula  Additional Equipment:   Intra-op Plan:   Post-operative Plan:   Informed Consent: I have reviewed the patients History and Physical, chart, labs and discussed the procedure including the risks, benefits and alternatives for the proposed anesthesia with the patient or authorized representative who has indicated his/her understanding and acceptance.     Dental Advisory Given  Plan Discussed with: Anesthesiologist, CRNA and Surgeon  Anesthesia Plan Comments: (Patient consented for risks of anesthesia including but not limited to:  - adverse reactions to medications - risk of airway placement if required - damage to eyes, teeth, lips or other oral mucosa - nerve damage due to positioning  - sore throat or hoarseness - Damage to heart, brain, nerves, lungs, other parts of body or loss of life  Patient voiced understanding.)        Anesthesia Quick Evaluation

## 2020-10-18 NOTE — Anesthesia Procedure Notes (Signed)
Procedure Name: General with mask airway Date/Time: 10/18/2020 1:08 PM Performed by: Johnna Acosta, CRNA Pre-anesthesia Checklist: Patient identified, Emergency Drugs available, Suction available, Patient being monitored and Timeout performed Patient Re-evaluated:Patient Re-evaluated prior to induction Oxygen Delivery Method: Circle system utilized Preoxygenation: Pre-oxygenation with 100% oxygen Induction Type: IV induction

## 2020-10-18 NOTE — H&P (Signed)
Denise Serrano is an 65 y.o. female.   Chief Complaint: Mood better. Confused after treatment but better yesterday HPI: recurrent depressio  Past Medical History:  Diagnosis Date   Anxiety    Arthritis of knee, left    Asthma    Bipolar 1 disorder, mixed ([place])    Carpal tunnel syndrome    bilateral   Cataract immature BILATERAL   Chronic kidney disease (CKD), stage III (moderate) ([place]) MONITORED BY DR Moshe Cipro   Diabetes mellitus without complication ([place])    no meds.  diet control   Elevated blood pressure (not hypertension) borderline-  monitored   Hyperlipidemia    Hypertension    Hypothyroidism    Impaired hearing hearing aid- bilateral   Nocturia    OSA on CPAP    Seasonal allergies    Stress incontinence, female    Urgency of urination     Past Surgical History:  Procedure Laterality Date   BLADDER SUSPENSION  05/07/2011   Procedure: SPARC PROCEDURE;  Surgeon: Reece Packer, MD;  Location: [place];  Service: Urology;  Laterality: N/A;  cysto, sparc sling    DX LAPAROSCOPY FOR INFERTILITY  27 YRS AGO   TUBAL LIGATION  51 YRS AGO    Family History  Problem Relation Age of Onset   Heart attack Mother    Heart attack Father    Social History:  reports that she has never smoked. She has never used smokeless tobacco. She reports previous alcohol use. She reports that she does not use drugs.  Allergies:  Allergies  Allergen Reactions   Amoxicillin Diarrhea   Iodine Other (See Comments)    Betadine soap causes her to want to pass out   Penicillin G Diarrhea    (Not in a hospital admission)   No results found for this or any previous visit (from the past 48 hour(s)). No results found.  Review of Systems  Constitutional: Negative.   HENT: Negative.    Eyes: Negative.   Respiratory: Negative.    Cardiovascular: Negative.   Gastrointestinal: Negative.   Musculoskeletal: Negative.   Skin: Negative.   Neurological: Negative.    Psychiatric/Behavioral:  Positive for confusion and decreased concentration. Negative for agitation, behavioral problems, dysphoric mood, hallucinations, self-injury, sleep disturbance and suicidal ideas. The patient is not nervous/anxious and is not hyperactive.    Blood pressure 111/66, pulse (!) 58, temperature 98.3 F (36.8 C), temperature source Oral, resp. rate 18, height 5' (1.524 m), weight 86.2 kg, SpO2 98 %. Physical Exam Vitals and nursing note reviewed.  Constitutional:      Appearance: She is well-developed.  HENT:     Head: Normocephalic and atraumatic.  Eyes:     Conjunctiva/sclera: Conjunctivae normal.     Pupils: Pupils are equal, round, and reactive to light.  Cardiovascular:     Heart sounds: Normal heart sounds.  Pulmonary:     Effort: Pulmonary effort is normal.  Abdominal:     Palpations: Abdomen is soft.  Musculoskeletal:        General: Normal range of motion.     Cervical back: Normal range of motion.  Skin:    General: Skin is warm and dry.  Neurological:     General: No focal deficit present.     Mental Status: She is alert.  Psychiatric:        Attention and Perception: Attention normal.        Mood and Affect: Mood normal.  Speech: Speech normal.        Behavior: Behavior is withdrawn.        Thought Content: Thought content is not paranoid. Thought content does not include homicidal or suicidal ideation.        Cognition and Memory: Memory is impaired.        Judgment: Judgment normal.     Assessment/Plan Patient wants to do today and then stop index to return in about 2 weeks  Alethia Berthold, MD 10/18/2020, 12:19 PM

## 2020-10-18 NOTE — Procedures (Signed)
ECT SERVICES Physician's Interval Evaluation & Treatment Note  Patient Identification: Denise Serrano MRN:  [id number] Date of Evaluation:  10/18/2020 TX #: 9  MADRS:   MMSE:   P.E. Findings:  No change to physical exam  Psychiatric Interval Note:  Patient tells me she thinks her mood is much better.  We discussed how much confusion she had after her last treatment.  Patient still thinks it is worthwhile to continue today.  Said she felt better yesterday.  Subjective:  Patient is a 65 y.o. female seen for evaluation for Electroconvulsive Therapy. Neatly dressed and groomed.  A little confused and slow but affect upbeat  Treatment Summary:   '[]'$   Right Unilateral             '[x]'$  Bilateral   % Energy : 1.0 ms 100%   Impedance: 1650 ohms  Seizure Energy Index: 43,825 V squared  Postictal Suppression Index: No reading  Seizure Concordance Index: 73%  Medications  Pre Shock: 80 mg ketamine 80 mg succinylcholine  Post Shock:    Seizure Duration: 19 seconds EMG about 40 seconds EEG   Comments: We continue to have a lot of trouble getting good computer tracings on this patient probably because of her skin.  Nevertheless it is clear that she had at least a 40 seizure today.  I talked with her and she prefers that this be the last of her index course which I think is very appropriate.  She does agree to come back in about 2 weeks for maintenance evaluation  Lungs:  '[x]'$   Clear to auscultation               '[]'$  Other:   Heart:    '[x]'$   Regular rhythm             '[]'$  irregular rhythm    '[x]'$   Previous H&P reviewed, patient examined and there are NO CHANGES                 '[]'$   Previous H&P reviewed, patient examined and there are changes noted.   Alethia Berthold, MD 7/29/20221:37 PM

## 2020-10-18 NOTE — Transfer of Care (Signed)
Immediate Anesthesia Transfer of Care Note  Patient: Gretchen Neal  Procedure(s) Performed: ECT TX  Patient Location: PACU  Anesthesia Type:General  Level of Consciousness: sedated  Airway & Oxygen Therapy: Patient Spontanous Breathing and Patient connected to face mask oxygen  Post-op Assessment: Report given to RN and Post -op Vital signs reviewed and stable  Post vital signs: Reviewed and stable  Last Vitals:  Vitals Value Taken Time  BP 187/90 10/18/20 1324  Temp 36.2 C 10/18/20 1322  Pulse 78 10/18/20 1324  Resp 16 10/18/20 1324  SpO2 98 % 10/18/20 1324  Vitals shown include unvalidated device data.  Last Pain:  Vitals:   10/18/20 1142  TempSrc:   PainSc: 0-No pain         Complications: No notable events documented.

## 2020-10-19 NOTE — Anesthesia Postprocedure Evaluation (Signed)
Anesthesia Post Note  Patient: [name], applications  Procedure(s) Performed: ECT TX  Patient location during evaluation: PACU Anesthesia Type: General Level of consciousness: awake and alert Pain management: pain level controlled Vital Signs Assessment: post-procedure vital signs reviewed and stable Respiratory status: spontaneous breathing, nonlabored ventilation, respiratory function stable and patient connected to nasal cannula oxygen Cardiovascular status: blood pressure returned to baseline and stable Postop Assessment: no apparent nausea or vomiting Anesthetic complications: no   No notable events documented.   Last Vitals:  Vitals:   10/18/20 1400 10/18/20 1433  BP:  (!) 145/80  Pulse: 67 65  Resp: (!) 28 (!) 25  Temp:  36.7 C  SpO2: 94%     Last Pain:  Vitals:   10/18/20 1433  TempSrc: Oral  PainSc: 0-No pain                 Martha Clan

## 2020-10-28 ENCOUNTER — Encounter: Payer: Self-pay | Admitting: Certified Registered Nurse Anesthetist

## 2020-10-28 ENCOUNTER — Other Ambulatory Visit: Payer: Self-pay | Admitting: Psychiatry

## 2020-10-28 ENCOUNTER — Encounter
Admission: RE | Admit: 2020-10-28 | Discharge: 2020-10-28 | Disposition: A | Discharge status: Home / Self Care | Payer: Medicare Other | Source: Ambulatory Visit | Attending: Psychiatry | Admitting: Psychiatry

## 2020-10-28 ENCOUNTER — Other Ambulatory Visit: Payer: Self-pay

## 2020-10-28 DIAGNOSIS — F332 Major depressive disorder, recurrent severe without psychotic features: Secondary | ICD-10-CM | POA: Diagnosis not present

## 2020-10-28 DIAGNOSIS — R410 Disorientation, unspecified: Secondary | ICD-10-CM | POA: Insufficient documentation

## 2020-10-28 DIAGNOSIS — Z880 Allergy status to penicillin: Secondary | ICD-10-CM | POA: Insufficient documentation

## 2020-10-28 DIAGNOSIS — F339 Major depressive disorder, recurrent, unspecified: Secondary | ICD-10-CM | POA: Insufficient documentation

## 2020-10-28 DIAGNOSIS — E119 Type 2 diabetes mellitus without complications: Secondary | ICD-10-CM | POA: Insufficient documentation

## 2020-10-28 DIAGNOSIS — E1122 Type 2 diabetes mellitus with diabetic chronic kidney disease: Secondary | ICD-10-CM | POA: Diagnosis not present

## 2020-10-28 DIAGNOSIS — Z888 Allergy status to other drugs, medicaments and biological substances status: Secondary | ICD-10-CM | POA: Diagnosis not present

## 2020-10-28 LAB — GLUCOSE, CAPILLARY
Glucose-Capillary: 143 mg/dL — ABNORMAL HIGH (ref 70–99)
Glucose-Capillary: 165 mg/dL — ABNORMAL HIGH (ref 70–99)

## 2020-10-28 MED ORDER — KETAMINE HCL 10 MG/ML IJ SOLN
INTRAMUSCULAR | Status: DC | PRN
  Administered 2020-10-28: 80 mg via INTRAVENOUS

## 2020-10-28 MED ORDER — LABETALOL HCL 5 MG/ML IV SOLN
INTRAVENOUS | Status: AC
  Filled 2020-10-28: qty 4

## 2020-10-28 MED ORDER — LABETALOL HCL 5 MG/ML IV SOLN
INTRAVENOUS | Status: DC | PRN
  Administered 2020-10-28: 10 mg via INTRAVENOUS

## 2020-10-28 MED ORDER — SUCCINYLCHOLINE CHLORIDE 200 MG/10ML IV SOSY
PREFILLED_SYRINGE | INTRAVENOUS | Status: AC
  Filled 2020-10-28: qty 10

## 2020-10-28 MED ORDER — SUCCINYLCHOLINE CHLORIDE 200 MG/10ML IV SOSY
PREFILLED_SYRINGE | INTRAVENOUS | Status: DC | PRN
  Administered 2020-10-28: 120 mg via INTRAVENOUS

## 2020-10-28 MED ORDER — SODIUM CHLORIDE 0.9 % IV SOLN
INTRAVENOUS | Status: DC | PRN
Start: 2020-10-28 — End: 2020-10-28

## 2020-10-28 MED ORDER — KETAMINE HCL 50 MG/5ML IJ SOSY
PREFILLED_SYRINGE | INTRAMUSCULAR | Status: AC
  Filled 2020-10-28: qty 10

## 2020-10-28 MED ORDER — SODIUM CHLORIDE 0.9 % IV SOLN
500.0000 mL | Freq: Once | INTRAVENOUS | Status: AC
  Administered 2020-10-28: 500 mL via INTRAVENOUS

## 2020-10-28 NOTE — Procedures (Signed)
ECT SERVICES Physician's Interval Evaluation & Treatment Note  Patient Identification: Denise Serrano MRN:  [id number] Date of Evaluation:  10/28/2020 TX #: 10  MADRS:   MMSE:   P.E. Findings:  No change to physical exam  Psychiatric Interval Note:  Patient is ambivalent about any improvement.  Last time we saw her she thought she was doing better now she says she does not see any change.  Also she is still complaining of memory problems  Subjective:  Patient is a 65 y.o. female seen for evaluation for Electroconvulsive Therapy. Continues to complain of memory problems.  Ambivalent about mood  Treatment Summary:   '[]'$   Right Unilateral             '[x]'$  Bilateral   % Energy : 1.0 ms 100%   Impedance: 2130 ohms  Seizure Energy Index: 32,044 V squared  Postictal Suppression Index: Less than 10%  Seizure Concordance Index: 30%  Medications  Pre Shock: 80 mg of ketamine 80 mg succinylcholine  Post Shock:    Seizure Duration: 27 seconds EMG 28 seconds EEG   Comments: Patient wants to stop after this treatment and give [place] a call after she has had some time to think about it and seeing how her mood and her memory recover.  Lungs:  '[x]'$   Clear to auscultation               '[]'$  Other:   Heart:    '[x]'$   Regular rhythm             '[]'$  irregular rhythm    '[x]'$   Previous H&P reviewed, patient examined and there are NO CHANGES                 '[]'$   Previous H&P reviewed, patient examined and there are changes noted.   Alethia Berthold, MD 8/8/20225:29 PM

## 2020-10-28 NOTE — Transfer of Care (Signed)
Immediate Anesthesia Transfer of Care Note  Patient: Denise Serrano  Procedure(s) Performed: ECT TX  Patient Location: PACU  Anesthesia Type:General  Level of Consciousness: awake and drowsy  Airway & Oxygen Therapy: Patient Spontanous Breathing and Patient connected to face mask oxygen  Post-op Assessment: Report given VSS   Post vital signs: Reviewed and stable  Last Vitals:  Vitals Value Taken Time  BP 208/106 10/28/20 1321  Temp    Pulse 81 10/28/20 1321  Resp 13 10/28/20 1321  SpO2 100 % 10/28/20 1321    Last Pain:  Vitals:   10/28/20 1119  TempSrc: Oral  PainSc: 0-No pain         Complications: No notable events documented.

## 2020-10-28 NOTE — H&P (Signed)
Denise Serrano is an 65 y.o. female.   Chief Complaint: mood still down HPI: recurrent depression  Past Medical History:  Diagnosis Date   Anxiety    Arthritis of knee, left    Asthma    Bipolar 1 disorder, mixed ([place])    Carpal tunnel syndrome    bilateral   Cataract immature BILATERAL   Chronic kidney disease (CKD), stage III (moderate) ([place]) MONITORED BY DR Moshe Cipro   Diabetes mellitus without complication ([place])    no meds.  diet control   Elevated blood pressure (not hypertension) borderline-  monitored   Hyperlipidemia    Hypertension    Hypothyroidism    Impaired hearing hearing aid- bilateral   Nocturia    OSA on CPAP    Seasonal allergies    Stress incontinence, female    Urgency of urination     Past Surgical History:  Procedure Laterality Date   BLADDER SUSPENSION  05/07/2011   Procedure: SPARC PROCEDURE;  Surgeon: Reece Packer, MD;  Location: [place];  Service: Urology;  Laterality: N/A;  cysto, sparc sling    DX LAPAROSCOPY FOR INFERTILITY  27 YRS AGO   TUBAL LIGATION  23 YRS AGO    Family History  Problem Relation Age of Onset   Heart attack Mother    Heart attack Father    Social History:  reports that she has never smoked. She has never used smokeless tobacco. She reports previous alcohol use. She reports that she does not use drugs.  Allergies:  Allergies  Allergen Reactions   Amoxicillin Diarrhea   Iodine Other (See Comments)    Betadine soap causes her to want to pass out   Penicillin G Diarrhea    (Not in a hospital admission)   Results for orders placed or performed during the hospital encounter of 10/28/20 (from the past 48 hour(s))  Glucose, capillary     Status: Abnormal   Collection Time: 10/28/20 10:58 AM  Result Value Ref Range   Glucose-Capillary 143 (H) 70 - 99 mg/dL    Comment: Glucose reference range applies only to samples taken after fasting for at least 8 hours.   No results found.  Review of  Systems  Constitutional: Negative.   HENT: Negative.    Eyes: Negative.   Respiratory: Negative.    Cardiovascular: Negative.   Gastrointestinal: Negative.   Musculoskeletal: Negative.   Skin: Negative.   Neurological: Negative.   Psychiatric/Behavioral: Negative.    All other systems reviewed and are negative.  Blood pressure (!) 164/80, pulse 62, temperature 98.1 F (36.7 C), temperature source Oral, resp. rate 18, height 5' (1.524 m), weight 86.2 kg, SpO2 98 %. Physical Exam Vitals and nursing note reviewed.  Constitutional:      Appearance: She is well-developed.  HENT:     Head: Normocephalic and atraumatic.  Eyes:     Conjunctiva/sclera: Conjunctivae normal.     Pupils: Pupils are equal, round, and reactive to light.  Cardiovascular:     Heart sounds: Normal heart sounds.  Pulmonary:     Effort: Pulmonary effort is normal.  Abdominal:     Palpations: Abdomen is soft.  Musculoskeletal:        General: Normal range of motion.     Cervical back: Normal range of motion.  Skin:    General: Skin is warm and dry.  Neurological:     General: No focal deficit present.     Mental Status: She is alert.  Psychiatric:  Mood and Affect: Mood normal.     Assessment/Plan Stop after today due to limited improvement  Alethia Berthold, MD 10/28/2020, 12:22 PM

## 2020-10-28 NOTE — Anesthesia Postprocedure Evaluation (Signed)
Anesthesia Post Note  Patient: [name], applications  Procedure(s) Performed: ECT TX  Patient location during evaluation: PACU Anesthesia Type: General Level of consciousness: awake and alert Pain management: pain level controlled Vital Signs Assessment: post-procedure vital signs reviewed and stable Respiratory status: spontaneous breathing, nonlabored ventilation, respiratory function stable and patient connected to nasal cannula oxygen Cardiovascular status: blood pressure returned to baseline and stable Postop Assessment: no apparent nausea or vomiting Anesthetic complications: no   No notable events documented.   Last Vitals:  Vitals:   10/28/20 1349 10/28/20 1358  BP: (!) 160/81 (!) 159/80  Pulse: 79 75  Resp: 12 12  Temp: 36.9 C 36.9 C  SpO2: 95%     Last Pain:  Vitals:   10/28/20 1358  TempSrc: Oral  PainSc: 0-No pain                 Molli Barrows

## 2020-10-28 NOTE — Anesthesia Procedure Notes (Signed)
Date/Time: 10/28/2020 1:00 PM Performed by: Johnna Acosta, CRNA Pre-anesthesia Checklist: Patient identified, Emergency Drugs available, Suction available, Patient being monitored and Timeout performed Patient Re-evaluated:Patient Re-evaluated prior to induction Oxygen Delivery Method: Simple face mask Preoxygenation: Pre-oxygenation with 100% oxygen Induction Type: IV induction

## 2020-10-28 NOTE — Anesthesia Preprocedure Evaluation (Signed)
Anesthesia Evaluation  Patient identified by MRN, date of birth, ID band Patient awake    Reviewed: Allergy & Precautions, H&P , NPO status , Patient's Chart, lab work & pertinent test results, reviewed documented beta blocker date and time   Airway Mallampati: II   Neck ROM: full    Dental  (+) Poor Dentition   Pulmonary asthma , sleep apnea ,    Pulmonary exam normal        Cardiovascular Exercise Tolerance: Poor hypertension, On Medications negative cardio ROS Normal cardiovascular exam Rhythm:regular Rate:Normal     Neuro/Psych PSYCHIATRIC DISORDERS Anxiety Bipolar Disorder  Neuromuscular disease    GI/Hepatic Neg liver ROS, GERD  ,  Endo/Other  diabetesHypothyroidism   Renal/GU Renal disease  negative genitourinary   Musculoskeletal   Abdominal   Peds  Hematology  (+) Blood dyscrasia, anemia ,   Anesthesia Other Findings Past Medical History: No date: Anxiety No date: Arthritis of knee, left No date: Asthma No date: Bipolar 1 disorder, mixed ([facility]) No date: Carpal tunnel syndrome     Comment:  bilateral BILATERAL: Cataract immature MONITORED BY DR GOL[place]BOROUGH: Chronic kidney disease (CKD), stage III  (moderate) ([facility]) No date: Diabetes mellitus without complication ([facility])     Comment:  no meds.  diet control borderline-  monitored: Elevated blood pressure (not hypertension) No date: Hyperlipidemia No date: Hypertension No date: Hypothyroidism hearing aid- bilateral: Impaired hearing No date: Nocturia No date: OSA on CPAP No date: Seasonal allergies No date: Stress incontinence, female No date: Urgency of urination Past Surgical History: 05/07/2011: BLADDER SUSPENSION     Comment:  Procedure: [place] PROCEDURE;  Surgeon: Reece Packer, MD;  Location: [place];                Service: Urology;  Laterality: N/A;  cysto, sparc sling  27 YRS AGO: DX LAPAROSCOPY FOR  INFERTILITY 20 YRS AGO: TUBAL LIGATION BMI    Body Mass Index: 37.11 kg/m     Reproductive/Obstetrics negative OB ROS                             Anesthesia Physical Anesthesia Plan  ASA: 3  Anesthesia Plan: General   Post-op Pain Management:    Induction:   PONV Risk Score and Plan:   Airway Management Planned:   Additional Equipment:   Intra-op Plan:   Post-operative Plan:   Informed Consent: I have reviewed the patients History and Physical, chart, labs and discussed the procedure including the risks, benefits and alternatives for the proposed anesthesia with the patient or authorized representative who has indicated his/her understanding and acceptance.     Dental Advisory Given  Plan Discussed with: CRNA  Anesthesia Plan Comments:         Anesthesia Quick Evaluation

## 2020-11-05 ENCOUNTER — Inpatient Hospital Stay (HOSPITAL_COMMUNITY)
Admission: EM | Admit: 2020-11-05 | Discharge: 2020-11-12 | DRG: 551 | Disposition: A | Discharge status: Home Health | Payer: Medicare Other | Attending: Internal Medicine | Admitting: Internal Medicine

## 2020-11-05 ENCOUNTER — Other Ambulatory Visit: Payer: Self-pay

## 2020-11-05 ENCOUNTER — Inpatient Hospital Stay (HOSPITAL_COMMUNITY): Payer: Medicare Other

## 2020-11-05 ENCOUNTER — Emergency Department (HOSPITAL_COMMUNITY): Payer: Medicare Other

## 2020-11-05 ENCOUNTER — Encounter (HOSPITAL_COMMUNITY): Payer: Self-pay

## 2020-11-05 DIAGNOSIS — Z880 Allergy status to penicillin: Secondary | ICD-10-CM

## 2020-11-05 DIAGNOSIS — F319 Bipolar disorder, unspecified: Secondary | ICD-10-CM | POA: Diagnosis present

## 2020-11-05 DIAGNOSIS — I10 Essential (primary) hypertension: Secondary | ICD-10-CM | POA: Diagnosis not present

## 2020-11-05 DIAGNOSIS — E1122 Type 2 diabetes mellitus with diabetic chronic kidney disease: Secondary | ICD-10-CM | POA: Diagnosis present

## 2020-11-05 DIAGNOSIS — R4182 Altered mental status, unspecified: Secondary | ICD-10-CM | POA: Diagnosis not present

## 2020-11-05 DIAGNOSIS — N179 Acute kidney failure, unspecified: Secondary | ICD-10-CM | POA: Diagnosis present

## 2020-11-05 DIAGNOSIS — Z7951 Long term (current) use of inhaled steroids: Secondary | ICD-10-CM | POA: Diagnosis not present

## 2020-11-05 DIAGNOSIS — H9190 Unspecified hearing loss, unspecified ear: Secondary | ICD-10-CM | POA: Diagnosis present

## 2020-11-05 DIAGNOSIS — Z79899 Other long term (current) drug therapy: Secondary | ICD-10-CM | POA: Diagnosis not present

## 2020-11-05 DIAGNOSIS — E785 Hyperlipidemia, unspecified: Secondary | ICD-10-CM | POA: Diagnosis present

## 2020-11-05 DIAGNOSIS — J45901 Unspecified asthma with (acute) exacerbation: Secondary | ICD-10-CM | POA: Diagnosis present

## 2020-11-05 DIAGNOSIS — I129 Hypertensive chronic kidney disease with stage 1 through stage 4 chronic kidney disease, or unspecified chronic kidney disease: Secondary | ICD-10-CM | POA: Diagnosis present

## 2020-11-05 DIAGNOSIS — Z7982 Long term (current) use of aspirin: Secondary | ICD-10-CM | POA: Diagnosis not present

## 2020-11-05 DIAGNOSIS — M2578 Osteophyte, vertebrae: Secondary | ICD-10-CM | POA: Diagnosis present

## 2020-11-05 DIAGNOSIS — F419 Anxiety disorder, unspecified: Secondary | ICD-10-CM | POA: Diagnosis present

## 2020-11-05 DIAGNOSIS — G4733 Obstructive sleep apnea (adult) (pediatric): Secondary | ICD-10-CM | POA: Diagnosis present

## 2020-11-05 DIAGNOSIS — M6282 Rhabdomyolysis: Secondary | ICD-10-CM | POA: Diagnosis present

## 2020-11-05 DIAGNOSIS — G9340 Encephalopathy, unspecified: Secondary | ICD-10-CM | POA: Diagnosis not present

## 2020-11-05 DIAGNOSIS — Z8249 Family history of ischemic heart disease and other diseases of the circulatory system: Secondary | ICD-10-CM

## 2020-11-05 DIAGNOSIS — N184 Chronic kidney disease, stage 4 (severe): Secondary | ICD-10-CM | POA: Diagnosis present

## 2020-11-05 DIAGNOSIS — F3175 Bipolar disorder, in partial remission, most recent episode depressed: Secondary | ICD-10-CM | POA: Diagnosis not present

## 2020-11-05 DIAGNOSIS — W06XXXA Fall from bed, initial encounter: Secondary | ICD-10-CM | POA: Diagnosis present

## 2020-11-05 DIAGNOSIS — S12500A Unspecified displaced fracture of sixth cervical vertebra, initial encounter for closed fracture: Secondary | ICD-10-CM

## 2020-11-05 DIAGNOSIS — M1712 Unilateral primary osteoarthritis, left knee: Secondary | ICD-10-CM | POA: Diagnosis present

## 2020-11-05 DIAGNOSIS — S12591A Other nondisplaced fracture of sixth cervical vertebra, initial encounter for closed fracture: Secondary | ICD-10-CM | POA: Diagnosis present

## 2020-11-05 DIAGNOSIS — U071 COVID-19: Secondary | ICD-10-CM | POA: Diagnosis present

## 2020-11-05 DIAGNOSIS — W19XXXA Unspecified fall, initial encounter: Secondary | ICD-10-CM

## 2020-11-05 DIAGNOSIS — Y92003 Bedroom of unspecified non-institutional (private) residence as the place of occurrence of the external cause: Secondary | ICD-10-CM

## 2020-11-05 DIAGNOSIS — F39 Unspecified mood [affective] disorder: Secondary | ICD-10-CM | POA: Diagnosis present

## 2020-11-05 DIAGNOSIS — R296 Repeated falls: Secondary | ICD-10-CM | POA: Diagnosis present

## 2020-11-05 DIAGNOSIS — N393 Stress incontinence (female) (male): Secondary | ICD-10-CM | POA: Diagnosis present

## 2020-11-05 DIAGNOSIS — M810 Age-related osteoporosis without current pathological fracture: Secondary | ICD-10-CM | POA: Diagnosis present

## 2020-11-05 DIAGNOSIS — S12501A Unspecified nondisplaced fracture of sixth cervical vertebra, initial encounter for closed fracture: Secondary | ICD-10-CM | POA: Diagnosis not present

## 2020-11-05 DIAGNOSIS — Z79890 Hormone replacement therapy (postmenopausal): Secondary | ICD-10-CM

## 2020-11-05 DIAGNOSIS — Z91041 Radiographic dye allergy status: Secondary | ICD-10-CM

## 2020-11-05 DIAGNOSIS — R3915 Urgency of urination: Secondary | ICD-10-CM | POA: Diagnosis present

## 2020-11-05 DIAGNOSIS — E039 Hypothyroidism, unspecified: Secondary | ICD-10-CM | POA: Diagnosis present

## 2020-11-05 LAB — CBC
HCT: 44.8 % (ref 36.0–46.0)
Hemoglobin: 14.8 g/dL (ref 12.0–15.0)
MCH: 33.3 pg (ref 26.0–34.0)
MCHC: 33 g/dL (ref 30.0–36.0)
MCV: 100.7 fL — ABNORMAL HIGH (ref 80.0–100.0)
Platelets: 167 10*3/uL (ref 150–400)
RBC: 4.45 MIL/uL (ref 3.87–5.11)
RDW: 11.9 % (ref 11.5–15.5)
WBC: 11.6 10*3/uL — ABNORMAL HIGH (ref 4.0–10.5)
nRBC: 0 % (ref 0.0–0.2)

## 2020-11-05 LAB — COMPREHENSIVE METABOLIC PANEL
ALT: 32 U/L (ref 0–44)
AST: 76 U/L — ABNORMAL HIGH (ref 15–41)
Albumin: 3.6 g/dL (ref 3.5–5.0)
Alkaline Phosphatase: 77 U/L (ref 38–126)
Anion gap: 12 (ref 5–15)
BUN: 34 mg/dL — ABNORMAL HIGH (ref 8–23)
CO2: 28 mmol/L (ref 22–32)
Calcium: 10.3 mg/dL (ref 8.9–10.3)
Chloride: 101 mmol/L (ref 98–111)
Creatinine, Ser: 3.03 mg/dL — ABNORMAL HIGH (ref 0.44–1.00)
GFR, Estimated: 17 mL/min — ABNORMAL LOW (ref >60)
Glucose, Bld: 86 mg/dL (ref 70–99)
Potassium: 3.8 mmol/L (ref 3.5–5.1)
Sodium: 141 mmol/L (ref 135–145)
Total Bilirubin: 1.2 mg/dL (ref 0.3–1.2)
Total Protein: 7 g/dL (ref 6.5–8.1)

## 2020-11-05 LAB — RAPID URINE DRUG SCREEN, HOSP PERFORMED
Amphetamines: NOT DETECTED
Barbiturates: NOT DETECTED
Benzodiazepines: POSITIVE — AB
Cocaine: NOT DETECTED
Opiates: NOT DETECTED
Tetrahydrocannabinol: NOT DETECTED

## 2020-11-05 LAB — URINALYSIS, ROUTINE W REFLEX MICROSCOPIC
Bilirubin Urine: NEGATIVE
Glucose, UA: NEGATIVE mg/dL
Ketones, ur: 5 mg/dL — AB
Leukocytes,Ua: NEGATIVE
Nitrite: NEGATIVE
Protein, ur: 30 mg/dL — AB
Specific Gravity, Urine: 1.013 (ref 1.005–1.030)
pH: 5 (ref 5.0–8.0)

## 2020-11-05 LAB — RESP PANEL BY RT-PCR (FLU A&B, COVID) ARPGX2
Influenza A by PCR: NEGATIVE
Influenza B by PCR: NEGATIVE
SARS Coronavirus 2 by RT PCR: POSITIVE — AB

## 2020-11-05 LAB — SODIUM, URINE, RANDOM: Sodium, Ur: 26 mmol/L

## 2020-11-05 LAB — CREATININE, URINE, RANDOM: Creatinine, Urine: 133.93 mg/dL

## 2020-11-05 LAB — CK: Total CK: 4636 U/L — ABNORMAL HIGH (ref 38–234)

## 2020-11-05 MED ORDER — GABAPENTIN 300 MG PO CAPS
300.0000 mg | ORAL_CAPSULE | Freq: Every day | ORAL | Status: DC
  Administered 2020-11-05 – 2020-11-12 (×8): 300 mg via ORAL
  Filled 2020-11-05 (×8): qty 1

## 2020-11-05 MED ORDER — ALPRAZOLAM 0.5 MG PO TABS
0.5000 mg | ORAL_TABLET | Freq: Every evening | ORAL | Status: DC | PRN
  Administered 2020-11-05 – 2020-11-11 (×8): 0.5 mg via ORAL
  Filled 2020-11-05 (×5): qty 1
  Filled 2020-11-05 (×2): qty 2
  Filled 2020-11-05 (×2): qty 1

## 2020-11-05 MED ORDER — CARVEDILOL 12.5 MG PO TABS
12.5000 mg | ORAL_TABLET | Freq: Two times a day (BID) | ORAL | Status: DC
  Administered 2020-11-05 – 2020-11-08 (×5): 12.5 mg via ORAL
  Filled 2020-11-05 (×5): qty 1
  Filled 2020-11-05: qty 2

## 2020-11-05 MED ORDER — LUMATEPERONE TOSYLATE 42 MG PO CAPS: 42.0000 mg | ORAL_CAPSULE | Freq: Every day | ORAL | Status: DC

## 2020-11-05 MED ORDER — ATORVASTATIN CALCIUM 10 MG PO TABS
20.0000 mg | ORAL_TABLET | Freq: Every day | ORAL | Status: DC
  Administered 2020-11-05 – 2020-11-06 (×2): 20 mg via ORAL
  Filled 2020-11-05 (×2): qty 2

## 2020-11-05 MED ORDER — ASPIRIN EC 81 MG PO TBEC
81.0000 mg | DELAYED_RELEASE_TABLET | Freq: Every day | ORAL | Status: DC
  Administered 2020-11-05 – 2020-11-12 (×8): 81 mg via ORAL
  Filled 2020-11-05 (×8): qty 1

## 2020-11-05 MED ORDER — ENOXAPARIN SODIUM 30 MG/0.3ML IJ SOSY
30.0000 mg | PREFILLED_SYRINGE | Freq: Every day | INTRAMUSCULAR | Status: DC
  Administered 2020-11-05 – 2020-11-11 (×7): 30 mg via SUBCUTANEOUS
  Filled 2020-11-05 (×7): qty 0.3

## 2020-11-05 MED ORDER — TRAZODONE HCL 50 MG PO TABS: 50.0000 mg | ORAL_TABLET | Freq: Every evening | ORAL | Status: DC | PRN

## 2020-11-05 MED ORDER — FLUTICASONE FUROATE-VILANTEROL 100-25 MCG/INH IN AEPB
1.0000 | INHALATION_SPRAY | Freq: Every day | RESPIRATORY_TRACT | Status: DC
  Administered 2020-11-06 – 2020-11-10 (×2): 1 via RESPIRATORY_TRACT
  Filled 2020-11-05: qty 28

## 2020-11-05 MED ORDER — DEUTETRABENAZINE 12 MG PO TABS: 28.0000 mg | ORAL_TABLET | Freq: Two times a day (BID) | ORAL | Status: DC

## 2020-11-05 MED ORDER — BUPROPION HCL ER (XL) 150 MG PO TB24
300.0000 mg | ORAL_TABLET | Freq: Every day | ORAL | Status: DC
  Administered 2020-11-05 – 2020-11-12 (×8): 300 mg via ORAL
  Filled 2020-11-05 (×6): qty 2
  Filled 2020-11-05 (×2): qty 1

## 2020-11-05 MED ORDER — LEVOTHYROXINE SODIUM 50 MCG PO TABS
50.0000 ug | ORAL_TABLET | Freq: Every day | ORAL | Status: DC
  Administered 2020-11-06 – 2020-11-12 (×7): 50 ug via ORAL
  Filled 2020-11-05 (×5): qty 1
  Filled 2020-11-05 (×2): qty 2
  Filled 2020-11-05: qty 1

## 2020-11-05 MED ORDER — LACTATED RINGERS IV SOLN: INTRAVENOUS | Status: AC

## 2020-11-05 MED ORDER — SODIUM CHLORIDE 0.9% FLUSH
3.0000 mL | Freq: Two times a day (BID) | INTRAVENOUS | Status: DC
  Administered 2020-11-06 – 2020-11-12 (×12): 3 mL via INTRAVENOUS

## 2020-11-05 MED ORDER — CARIPRAZINE HCL 1.5 MG PO CAPS
3.0000 mg | ORAL_CAPSULE | Freq: Every day | ORAL | Status: DC
  Administered 2020-11-05 – 2020-11-12 (×8): 3 mg via ORAL
  Filled 2020-11-05 (×6): qty 2
  Filled 2020-11-05: qty 1
  Filled 2020-11-05: qty 2
  Filled 2020-11-05: qty 1
  Filled 2020-11-05: qty 2

## 2020-11-05 MED ORDER — LACTATED RINGERS IV BOLUS
1000.0000 mL | Freq: Once | INTRAVENOUS | Status: AC
  Administered 2020-11-05: 1000 mL via INTRAVENOUS

## 2020-11-05 MED ORDER — INSULIN ASPART 100 UNIT/ML IJ SOLN
0.0000 [IU] | Freq: Three times a day (TID) | INTRAMUSCULAR | Status: DC
  Administered 2020-11-07: 2 [IU] via SUBCUTANEOUS
  Administered 2020-11-08: 5 [IU] via SUBCUTANEOUS

## 2020-11-05 MED ORDER — HYDROMORPHONE HCL 1 MG/ML IJ SOLN
0.5000 mg | INTRAMUSCULAR | Status: DC | PRN
Start: 2020-11-05 — End: 2020-11-12
  Administered 2020-11-05 – 2020-11-10 (×8): 0.5 mg via INTRAVENOUS
  Filled 2020-11-05: qty 0.5
  Filled 2020-11-05: qty 1
  Filled 2020-11-05 (×5): qty 0.5
  Filled 2020-11-05: qty 1
  Filled 2020-11-05: qty 0.5

## 2020-11-05 MED ORDER — AMLODIPINE BESYLATE 5 MG PO TABS
5.0000 mg | ORAL_TABLET | Freq: Every day | ORAL | Status: DC
  Administered 2020-11-05 – 2020-11-12 (×8): 5 mg via ORAL
  Filled 2020-11-05 (×8): qty 1

## 2020-11-05 MED ORDER — ALBUTEROL SULFATE (2.5 MG/3ML) 0.083% IN NEBU: 2.5000 mg | INHALATION_SOLUTION | RESPIRATORY_TRACT | Status: DC | PRN

## 2020-11-05 NOTE — ED Triage Notes (Signed)
Pt bib ems from home; unwitnessed fall this am; found on floor by husband by couch; denies being on floor for prolonged period of time; doesn't remember how she got to the floor; but endorses dizziness prior to fall; not on thinners; c/o upper back pain, lower back pain, and neck pain. Worse with palpation and movement; c collar in place; no obvious injury, deformity noted by ems; also c/o L shin pain, some bruising noted ; pt a and o but presenting w/ flat affect, hx bipolar husband endorses shock therapy x 1 weeks ago; endorses sore throat and cough x 1-2 weeks; warm to touch, temp 100.79fw/ ems; 12 lead unremarkable; denies N/V/D

## 2020-11-05 NOTE — Progress Notes (Signed)
Called in regards to this patients Ct cervical spine results. She has a nondisplaced left superior C6 facet fracture and an anterior osteophyte fracture. No neurosurgical intervention is warranted. I would recommend an aspen collar and follow up in the office in two weeks.

## 2020-11-05 NOTE — ED Notes (Signed)
Admitting physician notified about needed diet order and pain meds. Pharmacy notified about needed home med verification.

## 2020-11-05 NOTE — ED Provider Notes (Signed)
[facility] EMERGENCY DEPARTMENT Provider Note   CSN: [id number] Arrival date & time: 11/05/20  [id number]     History Chief Complaint  Patient presents with   Denise Serrano is a 65 y.o. female.  HPI 65 year old female history of bipolar disorder, status post ECT, CKD, diabetes, hypertension, presents today from home with report of fall.  She was found on the floor with unknown amount of time.  She states that she fell out of bed.  However she seems somewhat confused and states that her is not quite clear.  She is complaining of pain in her back, her wrist, and her left hip.  She endorses some sore throat and cough.  Called husband and Discussed with him.  He states that she fell out of bed this morning and crawled around on the floor until she wedged herself cream furniture.  He states he was finally able to get her up and she went out to the room and fell again.  He reports that she takes lots of medications and is often altered from these.  He is not entirely clear which medicine she is taking or not.  He states that Xanax is listed and she is told that she is not taking it, but he is concerned that she might be taking it has she has not been totally honest about her medications before.  She also appears to be on Neurontin.  He states that he thinks that she has some type of infection since she had her ECT at [facility].      Past Medical History:  Diagnosis Date   Anxiety    Arthritis of knee, left    Asthma    Bipolar 1 disorder, mixed ([place])    Carpal tunnel syndrome    bilateral   Cataract immature BILATERAL   Chronic kidney disease (CKD), stage III (moderate) ([place]) MONITORED BY DR Moshe Cipro   Diabetes mellitus without complication ([place])    no meds.  diet control   Elevated blood pressure (not hypertension) borderline-  monitored   Hyperlipidemia    Hypertension    Hypothyroidism    Impaired hearing hearing aid- bilateral   Nocturia    OSA on  CPAP    Seasonal allergies    Stress incontinence, female    Urgency of urination     Patient Active Problem List   Diagnosis Date Noted   Anemia, iron deficiency 0000000   Metabolic acidosis [id number]   Anemia of renal disease 07/01/2014   Acute renal failure ([place]) 06/28/2014   Asthma exacerbation 06/28/2014   Dizziness 06/28/2014   GERD (gastroesophageal reflux disease) 06/28/2014   AKI (acute kidney injury) ([place]) 05/03/2014   Bipolar disorder ([place]) 05/03/2014   Essential hypertension 05/03/2014   Diarrhea of presumed infectious origin 05/03/2014   Hypothyroidism 05/03/2014   Hyperlipidemia 05/03/2014   Gait difficulty 11/15/2013   Type 2 diabetes mellitus with diabetic chronic kidney disease ([employer]) 11/14/2013   Diabetic polyneuropathy associated with type 2 diabetes mellitus ([place]) 11/14/2013   Ingrown left big toenail 10/22/2010    Past Surgical History:  Procedure Laterality Date   BLADDER SUSPENSION  05/07/2011   Procedure: SPARC PROCEDURE;  Surgeon: Reece Packer, MD;  Location: [place];  Service: Urology;  Laterality: N/A;  cysto, sparc sling    DX LAPAROSCOPY FOR INFERTILITY  27 YRS AGO   TUBAL LIGATION  20 YRS AGO     OB History   No obstetric history  on file.     Family History  Problem Relation Age of Onset   Heart attack Mother    Heart attack Father     Social History   Tobacco Use   Smoking status: Never   Smokeless tobacco: Never  Vaping Use   Vaping Use: Never used  Substance Use Topics   Alcohol use: Not Currently   Drug use: No    Home Medications Prior to Admission medications   Medication Sig Start Date End Date Taking? Authorizing Provider  adapalene (DIFFERIN) 0.1 % gel Apply 1 application topically at bedtime.     [provider]  albuterol (PROVENTIL) (2.5 MG/3ML) 0.083% nebulizer solution Take 3 mLs (2.5 mg total) by nebulization every 4 (four) hours as needed for wheezing or shortness of breath.  07/01/14   Janece Canterbury, MD  ALPRAZolam Duanne Moron) 0.5 MG tablet Take 0.5 mg by mouth 3 (three) times daily as needed for anxiety (anxiety). For anxiety.    [provider]  amLODipine (NORVASC) 2.5 MG tablet Take 5 mg by mouth daily. pm 04/27/16   [provider]  aspirin EC 81 MG tablet Take 81 mg by mouth daily.    [provider]  atorvastatin (LIPITOR) 20 MG tablet Take 20 mg by mouth daily. 03/25/16   [provider]  budesonide-formoterol (SYMBICORT) 80-4.5 MCG/ACT inhaler Inhale 2 puffs into the lungs 2 (two) times daily.    [provider]  buPROPion (WELLBUTRIN XL) 300 MG 24 hr tablet Take 300 mg by mouth daily.    [provider]  cariprazine (VRAYLAR) 3 MG capsule Take by mouth daily. PM    [provider]  carvedilol (COREG) 12.5 MG tablet Take 12.5 mg by mouth 2 (two) times daily with a meal.    [provider]  cetirizine (ZYRTEC) 10 MG tablet Take 10 mg by mouth daily.    [provider]  Deutetrabenazine (AUSTEDO) 12 MG TABS Take 28 mg by mouth 2 (two) times daily.    [provider]  docusate sodium (COLACE) 100 MG capsule Take 100 mg by mouth daily. am    [provider]  fesoterodine (TOVIAZ) 4 MG TB24 tablet Take 4 mg by mouth daily.    [provider]  gabapentin (NEURONTIN) 300 MG capsule Take 300 mg by mouth 2 (two) times daily. 03/25/16   [provider]  levothyroxine (SYNTHROID, LEVOTHROID) 50 MCG tablet Take 50 mcg by mouth daily. 03/25/16   [provider]  metroNIDAZOLE (METROGEL) 1 % gel Apply 1 application topically 2 (two) times daily.     [provider]  mirabegron ER (MYRBETRIQ) 25 MG TB24 tablet Take 25 mg by mouth daily. am    [provider]  montelukast (SINGULAIR) 5 MG chewable tablet Chew 5 mg by mouth daily. am    [provider]  Multiple Vitamin (MULTIVITAMIN WITH MINERALS) TABS tablet Take 1 tablet by mouth 2  (two) times daily.    [provider]  ranitidine (ZANTAC) 300 MG tablet Take 300 mg by mouth daily. 04/24/16   [provider]  traZODone (DESYREL) 50 MG tablet Take 50 mg by mouth at bedtime as needed for sleep. 1-3 as needed for sleep    [provider]  valACYclovir (VALTREX) 500 MG tablet Take 500 mg by mouth 2 (two) times daily. As needed    [provider]    Allergies    Amoxicillin, Iodine, and Penicillin g  Review of Systems  Review of Systems  All other systems reviewed and are negative.  Physical Exam Updated Vital Signs BP 109/72   Pulse 69   Temp 99.4 F (37.4 C) (Rectal)   Resp 13   Ht 1.524 m (5')   Wt 72.6 kg   SpO2 94%   BMI 31.25 kg/m   Physical Exam Vitals and nursing note reviewed.  Constitutional:      General: She is not in acute distress.    Appearance: She is ill-appearing.  HENT:     Head: Normocephalic and atraumatic.     Right Ear: External ear normal.     Left Ear: External ear normal.     Nose: Nose normal.     Mouth/Throat:     Mouth: Mucous membranes are dry.     Pharynx: Oropharynx is clear.  Eyes:     Extraocular Movements: Extraocular movements intact.     Pupils: Pupils are equal, round, and reactive to light.  Neck:     Comments: Cervical collar in place and diffuse tenderness palpation Cardiovascular:     Rate and Rhythm: Normal rate and regular rhythm.  Pulmonary:     Effort: Pulmonary effort is normal.     Breath sounds: Normal breath sounds.     Comments: Sats 93 to 95% Abdominal:     General: Abdomen is flat.     Palpations: Abdomen is soft.  Musculoskeletal:     Comments: Diffuse tenderness palpation over thoracic and lumbar spine Back examined with no obvious external signs of trauma Some tenderness palpation of her left hip and right wrist but full active range of motion of all extremities with no obvious external signs of trauma, no deformity  Skin:    General: Skin is warm and  dry.     Capillary Refill: Capillary refill takes less than 2 seconds.  Neurological:     General: No focal deficit present.     Mental Status: She is alert.  Psychiatric:        Attention and Perception: She is inattentive.        Mood and Affect: Affect is blunt.        Speech: She is noncommunicative.        Behavior: Behavior is slowed.        Thought Content: Thought content does not include homicidal or suicidal ideation.        Cognition and Memory: Cognition is impaired.    ED Results / Procedures / Treatments   Labs (all labs ordered are listed, but only abnormal results are displayed) Labs Reviewed  RESP PANEL BY RT-PCR (FLU A&B, COVID) ARPGX2 - Abnormal; Notable for the following components:      Result Value   SARS Coronavirus 2 by RT PCR POSITIVE (*)    All other components within normal limits  CBC - Abnormal; Notable for the following components:   WBC 11.6 (*)    MCV 100.7 (*)    All other components within normal limits  COMPREHENSIVE METABOLIC PANEL - Abnormal; Notable for the following components:   BUN 34 (*)    Creatinine, Ser 3.03 (*)    AST 76 (*)    GFR, Estimated 17 (*)    All other components within normal limits  CULTURE, BLOOD (ROUTINE X 2)  CULTURE, BLOOD (ROUTINE X 2)  URINALYSIS, ROUTINE W REFLEX MICROSCOPIC  CK  RAPID URINE DRUG SCREEN, HOSP PERFORMED    EKG EKG Interpretation  Date/Time:  Tuesday November 05 2020 09:57:46  EDT Ventricular Rate:  72 PR Interval:  163 QRS Duration: 87 QT Interval:  425 QTC Calculation: 466 R Axis:   94 Text Interpretation: Sinus rhythm Consider left atrial enlargement Right axis deviation Low voltage, precordial leads Confirmed by Pattricia Boss [phone]) on 11/05/2020 1:20:28 PM  Radiology DG Thoracic Spine 2 View  Result Date: 11/05/2020 CLINICAL DATA:  Back pain after fall. EXAM: THORACIC SPINE 2 VIEWS COMPARISON:  Chest x-Indigo Chaddock dated November 17, 2019. FINDINGS: There is no evidence of thoracic spine  fracture. Alignment is normal. No other significant bone abnormalities are identified. IMPRESSION: Negative. Electronically Signed   By: Titus Dubin M.D.   On: 11/05/2020 11:14   DG Lumbar Spine Complete  Result Date: 11/05/2020 CLINICAL DATA:  Status post fall.  Pain. EXAM: LUMBAR SPINE - COMPLETE 4+ VIEW COMPARISON:  None. FINDINGS: Normal alignment. The vertebral body heights and disc spaces are well preserved. The facet joints appear well aligned. No fracture or dislocation. Atherosclerotic calcifications noted involving the aorta. IMPRESSION: 1. No acute findings. 2. Atherosclerosis. Electronically Signed   By: Kerby Moors M.D.   On: 11/05/2020 11:10   DG Wrist Complete Right  Result Date: 11/05/2020 CLINICAL DATA:  Pain after fall. EXAM: RIGHT WRIST - COMPLETE 3+ VIEW COMPARISON:  None. FINDINGS: There is no evidence of fracture or dislocation. There is no evidence of arthropathy or other focal bone abnormality. Soft tissues are unremarkable. IMPRESSION: Negative. Electronically Signed   By: Kerby Moors M.D.   On: 11/05/2020 11:06   CT HEAD WO CONTRAST (5MM)  Result Date: 11/05/2020 CLINICAL DATA:  65 year old female with head trauma EXAM: CT HEAD WITHOUT CONTRAST CT CERVICAL SPINE WITHOUT CONTRAST TECHNIQUE: Multidetector CT imaging of the head and cervical spine was performed following the standard protocol without intravenous contrast. Multiplanar CT image reconstructions of the cervical spine were also generated. COMPARISON:  None. FINDINGS: CT HEAD FINDINGS Brain: No acute intracranial hemorrhage. No midline shift or mass effect. Gray-white differentiation maintained. Unremarkable appearance of the ventricular system. Vascular: Calcifications of the intracranial anterior circulation. Skull: No acute fracture.  No aggressive bone lesion identified. Sinuses/Orbits: Partial opacification of the left-sided mastoid air cells. Right-sided mastoid clear. Paranasal sinuses are clear. Trace  mucosal disease of the frontal sinuses. Other: None CT CERVICAL SPINE FINDINGS Alignment: Craniocervical junction aligned. Anatomic alignment of the cervical elements. No subluxation. Skull base and vertebrae: Craniocervical junction unremarkable. Acute nondisplaced fracture of the left sixth superior facet, seen on the parasagittal images. Associated small chip fracture of C6 anterior left osteophyte. Soft tissues and spinal canal: Unremarkable cervical soft tissues. No lymphadenopathy. Disc levels: C2-C3, right facet hypertrophy with no canal narrowing or foraminal narrowing. C3-C4: No significant canal narrowing or foraminal narrowing C4-C5: No significant canal narrowing or foraminal narrowing C5-C6: Disc space narrowing with posterior disc osteophyte complex and uncovertebral joint disease. No significant canal narrowing. Mild bilateral foraminal narrowing C6-C7: No canal narrowing.  No significant foraminal narrowing Upper chest: Unremarkable appearance of the lung apices. Other: No bony canal narrowing. IMPRESSION: Head CT: No acute intracranial abnormality. Cervical CT: Acute nondisplaced fracture of the left C6 superior facet, as well C6 anterior osteophyte fracture. Electronically Signed   By: Corrie Mckusick D.O.   On: 11/05/2020 11:29   CT Cervical Spine Wo Contrast  Result Date: 11/05/2020 CLINICAL DATA:  65 year old female with head trauma EXAM: CT HEAD WITHOUT CONTRAST CT CERVICAL SPINE WITHOUT CONTRAST TECHNIQUE: Multidetector CT imaging of the head and cervical spine was performed following the standard protocol  without intravenous contrast. Multiplanar CT image reconstructions of the cervical spine were also generated. COMPARISON:  None. FINDINGS: CT HEAD FINDINGS Brain: No acute intracranial hemorrhage. No midline shift or mass effect. Gray-white differentiation maintained. Unremarkable appearance of the ventricular system. Vascular: Calcifications of the intracranial anterior circulation.  Skull: No acute fracture.  No aggressive bone lesion identified. Sinuses/Orbits: Partial opacification of the left-sided mastoid air cells. Right-sided mastoid clear. Paranasal sinuses are clear. Trace mucosal disease of the frontal sinuses. Other: None CT CERVICAL SPINE FINDINGS Alignment: Craniocervical junction aligned. Anatomic alignment of the cervical elements. No subluxation. Skull base and vertebrae: Craniocervical junction unremarkable. Acute nondisplaced fracture of the left sixth superior facet, seen on the parasagittal images. Associated small chip fracture of C6 anterior left osteophyte. Soft tissues and spinal canal: Unremarkable cervical soft tissues. No lymphadenopathy. Disc levels: C2-C3, right facet hypertrophy with no canal narrowing or foraminal narrowing. C3-C4: No significant canal narrowing or foraminal narrowing C4-C5: No significant canal narrowing or foraminal narrowing C5-C6: Disc space narrowing with posterior disc osteophyte complex and uncovertebral joint disease. No significant canal narrowing. Mild bilateral foraminal narrowing C6-C7: No canal narrowing.  No significant foraminal narrowing Upper chest: Unremarkable appearance of the lung apices. Other: No bony canal narrowing. IMPRESSION: Head CT: No acute intracranial abnormality. Cervical CT: Acute nondisplaced fracture of the left C6 superior facet, as well C6 anterior osteophyte fracture. Electronically Signed   By: Corrie Mckusick D.O.   On: 11/05/2020 11:29   DG Hip Unilat W or Wo Pelvis 2-3 Views Left  Result Date: 11/05/2020 CLINICAL DATA:  Left hip pain after fall. EXAM: DG HIP (WITH OR WITHOUT PELVIS) 2-3V LEFT COMPARISON:  CT abdomen pelvis dated May 04, 2014. FINDINGS: There is no evidence of hip fracture or dislocation. There is no evidence of arthropathy or other focal bone abnormality. Dystrophic soft tissue calcification overlying the left hip. IMPRESSION: Negative. Electronically Signed   By: Titus Dubin  M.D.   On: 11/05/2020 11:11    Procedures .Critical Care  Date/Time: 11/05/2020 3:42 PM Performed by: Pattricia Boss, MD Authorized by: Pattricia Boss, MD   Critical care provider statement:    Critical care time (minutes):  45   Critical care was necessary to treat or prevent imminent or life-threatening deterioration of the following conditions:  CNS failure or compromise   Critical care was time spent personally by me on the following activities:  Discussions with consultants, evaluation of patient's response to treatment, examination of patient, ordering and performing treatments and interventions, ordering and review of laboratory studies, ordering and review of radiographic studies, pulse oximetry, re-evaluation of patient's condition, obtaining history from patient or surrogate and review of old charts   Medications Ordered in ED Medications  enoxaparin (LOVENOX) injection 30 mg (has no administration in time range)  sodium chloride flush (NS) 0.9 % injection 3 mL (has no administration in time range)  lactated ringers bolus 1,000 mL (1,000 mLs Intravenous New Bag/Given 11/05/20 1512)    ED Course  I have reviewed the triage vital signs and the nursing notes.  Pertinent labs & imaging results that were available during my care of the patient were reviewed by me and considered in my medical decision making (see chart for details). Discussed C6 fracture with Dr. Ronnald Ramp who advises continuing cervical immobilization.  They will see in consult Clinical Course as of 11/05/20 1542  Tue Nov 05, 2020  1453 Creatinine 3.03 [DR]  1453 Creatinine(!): 3.03 Last creatinine 1.87 [DR]  1453 Plan fluid  bolus [DR]    Clinical Course User Index [DR] Pattricia Boss, MD  3:42 PM Patient continues slow to respond but is arousable and will follow commands. MDM Rules/Calculators/A&P                          Patient presents today with altered mental status.  She had several falls were preceded  this.  Her vital signs have been stable here.  She does have some cough and mildly low sats and is COVID-positive. Falls could be from metabolic encephalopathy with her COVID infection versus multiple medications which could be contributing to this occluding Xanax and Neurontin.  She also has a history of bipolar disorder.  She has a history of diabetes-here blood sugars have been 100-200. CT obtained of head and neck with 6 facet fracture Patient complaining of lumbar and thoracic pain but no evidence of fracture noted on plain x-rays Patient complaining of hip pain no evidence of fracture noted on x-Jacari Kirsten and she has full active range of motion Right wrist pain normal x-Meshach Perry Patient is followed at [facility] and urinalysis. Plan admission for further evaluation and treatment Discussed with Dr. Marva Panda, who will see for IM teaching service. Final Clinical Impression(s) / ED Diagnoses Final diagnoses:  Fall, initial encounter  COVID    Rx / DC Orders ED Discharge Orders     None        Pattricia Boss, MD 11/05/20 1542

## 2020-11-05 NOTE — H&P (Addendum)
Date: 11/05/2020               Patient Name:  Denise Serrano MRN: [id number]  DOB: 26-Mar-1955 Age / Sex: 65 y.o., female   PCP: Katherina Mires, MD              Medical Service: Internal Medicine Teaching Service              Attending Physician: Dr. Sid Falcon, MD    First Contact: [place], [place] 4 Pager: [phone]  Second Contact: Dr. Marva Panda Pager: [phone]            After Hours (After 5p/  First Contact Pager: [phone]  weekends / holidays): Second Contact Pager: [phone]   Chief Complaint: Fall and AMS  History of Present Illness:  Mrs. Femia is a 65 year old person with a pmhx of Bipolar I Disorder s/p ECT on 7/25, CKD G4, diet controlled T2DM, HTN, HLD, and hypothyroidism h/w AMS after a recent fall.   She reports that she was sleeping last night when she was awaken by her fall from her bed. She states that she was able to "wobble" from her bed room to the kitchen where she sat on the floor and waited for her husband to wake up and call 911 in the morning.   She sleeps in a hospital bed, which she says has some height. She also found herself in a pool of fluid which she thinks is from her CPAP machine . She does not think she urinated herself. She was not confused or altered after her fall. She has not been eating and drinking as normal given her recent COVID illness, initially tested positive on 08/03, however endorses no weakness or dizziness with standing. She has no other history of falls from the bed in the past. No recent changes to her medications. She did share that she took some Zanax last night to help her sleep.  Otherwise no recent abdominal pain, urinary urgency or frequency.   In the ED, CT found a non-displaced C6 facet and anterior osteophyte fracture. Stabilized with neck brace.   Collateral: Patient fell from bed and SO [name]) found her around 8am. He is unclear how long she was down for before he found her. He says that she fell head first and  complained of neck pain. He did not think that she was more confused immediately after the fall. He is unclear about how much of her medications she is taking, however they do come pre-packaged. He reports that she sleeps for about 20 hours in a day and is unable to complete her ADLs. She is confused at baseline per [name] however maybe slightly more confused since her COVID infection. She does not eat a lot at baseline and does not drink alcohol. She follow with Dr. Toy Care for her psychiatric care. He is concerned about her quality of life moving forward and would like to consider placement in a nursing facility as he himself is 65 years old and having difficulty caring both for himself and her.   Meds:  Albuterol q4h prn Alprazolam 0.5 mg tid * Bupropion 300 mg daily  Carvedilol 12.5 mg bid Cetirizine 10 mg daily Deutetrabenazine 12 mg bid Colace 100 mg daily Fesoterodine 4 mg daily Gabapentin 300 mg daily * Mirabegron ER 25 mg daily Montelukast 5 mg daily Multi vitamins Ranitidine 300 mg daily Valacyclovir 500 mg bid prn Lumateperone tosylate 42 mg daily  Asprin 81 mg daily  Atorvastatin 20 mg  daily Symbicort 2 puffs bid Levothyroxine 50 mcg daily Trazodone 50 mg daily prn for sleep *  Amlodipine 2.5 mg daily-not taking   Allergies: Amoxicillin- diarrhea  Iodine- Betadine soap causes her to want to pass out Penicilin G- diarrhea   Past Medical History: -Mood disorder on home Alprazolam 0.5 mg tid, Bupropion 300 mg daily and Gabapentin 300 mg daily -Left knee arthritis  -Asthma on home Albuterol q4h prn, Symbicort 2 puffs bid and Montelukast 5 mg daily -Bipolar 1 disorder on home Lumateperone tosylate 42 mg daily  -Carpal tunnel syndrome -CKD G4 -Diet controlled DM -HTN on home Carvedilol 12.5 mg bid (not taking amlodipine)  -HLD on home Atorvastatin 20 mg daily -Hypothyroidism on home Levothyroxine 50 mcg daily -Hearing impairment  -OSA on nightly CPAP -Stress  incontinence and urinary urgency on home Mirabegron ER 25 mg daily and Fesoterodine 4 mg daily -Disordered sleep on home Trazodone 50 mg daily   Family History: Patient states that non of her family is living and per chart both mom and dad had a hx of heart attack.   Social History: She lives in [place] with her significant other [name] and their dogs. She does not endorse a smoking history and no significant alcohol history. Sounds like she drinks socially. No non-prescription drug use hx.   Review of Systems: A complete ROS was negative except as per HPI.   Physical Exam: Blood pressure 109/72, pulse 69, temperature 99.4 F (37.4 C), temperature source Rectal, resp. rate 13, height 5' (1.524 m), weight 72.6 kg, SpO2 94 %. General: Well-developed person lying awake in bed. In some discomfort.  HEENT: Neck in cervical collar.  CV: RRR, no murmurs appreciated.  Pulm: No increased work of breathing. Difficult to auscultate lung sounds. Abdomen: Diffusely tender to palpation without guarding. Soft and non-distended abdomen with active bowel sounds.  Extremities: No LEE. Mild bruising to the left dorsal feet. Good sensation through out lower extremities. Upper extremities with weakness form soreness.  Neuro: Alert and oriented to person and situation.  Psych: Anxious affect, appropriate for situation.   CBC Latest Ref Rng & Units 11/05/2020 11/17/2019 04/06/2019  WBC 4.0 - 10.5 K/uL 11.6(H) 7.9 8.3  Hemoglobin 12.0 - 15.0 g/dL 14.8 14.3 14.4  Hematocrit 36.0 - 46.0 % 44.8 43.3 45.5  Platelets 150 - 400 K/uL 167 147(L) 180   CMP Latest Ref Rng & Units 11/05/2020 11/17/2019 04/06/2019  Glucose 70 - 99 mg/dL 86 104(H) 112(H)  BUN 8 - 23 mg/dL 34(H) 27(H) 33(H)  Creatinine 0.44 - 1.00 mg/dL 3.03(H) 1.87(H) 2.11(H)  Sodium 135 - 145 mmol/L 141 140 141  Potassium 3.5 - 5.1 mmol/L 3.8 4.3 3.9  Chloride 98 - 111 mmol/L 101 102 103  CO2 22 - 32 mmol/L '28 30 26  '$ Calcium 8.9 - 10.3 mg/dL 10.3 9.5  9.4  Total Protein 6.5 - 8.1 g/dL 7.0 - 7.1  Total Bilirubin 0.3 - 1.2 mg/dL 1.2 - 0.8  Alkaline Phos 38 - 126 U/L 77 - 91  AST 15 - 41 U/L 76(H) - 66(H)  ALT 0 - 44 U/L 32 - 94(H)   CK-pending UA- pending  HIV- pending TSH-pending A1c- pending   Bcx- pending   EKG: personally reviewed my interpretation is sinus rhythm.   X-ray lumbar and thoracic spine: w/o fractures X-ray wrist: w/o fractures  X-ray hip: no hip fractures   CT head w/o contrast: no acute changes.  CT cervical spine: Acute nondisplaced fracture of the left C6 superior  facet, as well C6 anterior osteophyte fracture.  Assessment & Plan by Problem: Principal Problem:   C6 cervical fracture (HCC) Active Problems:   Type 2 diabetes mellitus with diabetic chronic kidney disease ([place])   AKI (acute kidney injury) ([place])   Bipolar disorder ([place])   Essential hypertension   Hypothyroidism   Hyperlipidemia   Asthma exacerbation   CKD (chronic kidney disease) stage 4, GFR 15-29 ml/min (HCC)   Accidental fall from bed  Non-displaced fracture of sub-axial cervical spine  Fall Polypharmacy  Pt with recent fall from bed, found to have a nondisplaced C6 facet and anterior osteophyte fracture on CT. Neurosurgery recommends Aspen collar and outpatient follow up in 2 weeks. On top of her acute COVID illness and decreased oral intake, pt currently on home Trazodone 50 mg daily prn that could have contributed to her fall. Given recent worsening in confusion, pt could be encephalopathic from [place]. She is afebrile however has a wbc at 11.4 and diffuse abdominal tenderness concerning for UTI. She also could benefit from future osteoporosis work up. PDMP reviewed, she has been taking Alprazolam 0.5 mg tid for a long period and it is less likely to have influenced this fall.  - hold home trazodone - f/u bcx - pain: dilaudid 0.5 q4 prn for severe pain - PT/OT eval  - Outpatient: f/u with neurosurg in 2 wks and DEXA scan   Acute  on CKD G4 Pt presenting with elevated creatinine of 3.03 (bl~1-2) and BUN of 34. Suspect a pre-renal etiology given recent decreased PO intake.FeNa 0.6%, consistent with pre-renal etiology. Renal US without any obstructive uropathy. Patient received 1L LR in the ED. Given concern for possible extended down time, will check for rhabdomyolysis.  - f/u CK  - f/u UA - LR 100cc/hr x12 hr  Recent COVID Infection Tested positive on 8/3; noted decreased appetite following but no current symptoms in ED. Outside the quarantine period.   Diet controlled with T2DM Last A1c in 2016 at 7.0. Not on home medications. - f/u a1c - SSI  Essential HTN SBP ranging in 109-148 mmHg. Not taking home amlodipine.   HLD On home atorvastatin with last LDL of 34 and HDL 29 in 2016. - f/u lipid panel  - Continue home Atorvastatin 20 mg daily  Hypothyroidism Last TSH normal at 3.257 and T4 at 0.88. On home levothyroxine.  - f/u TSH - Continue Levothyroxine 50 mcg daily  Bipolar I disorder s/p ECT ECT on 10/16/2020.  - continue Cariprazine 3 mg daily and Lumateperone tosylate 42 mg daily  - consider psych consult in am  Mood disorder (depression/anxiety) On home Alprazolam 0.5 mg tid, Bupropion 300 mg daily and Gabapentin 300 mg daily.  - continue home gabapentin and bupropion   Diet: HH VTE: enoxaparin  Code: full   Dispo: Admit patient to Observation with expected length of stay less than 2 midnights.  Signed: Domingo Dimes, Medical Student 11/05/2020, 5:27 PM    Attestation for Student Documentation:  I personally was present and performed or re-performed the history, physical exam and medical decision-making activities of this service and have verified that the service and findings are accurately documented in the student's note.  Harvie Heck, MD IMTS PGY-3 11/05/2020, 8:01 PM Pager: [phone]

## 2020-11-06 ENCOUNTER — Inpatient Hospital Stay (HOSPITAL_COMMUNITY): Payer: Medicare Other

## 2020-11-06 DIAGNOSIS — I10 Essential (primary) hypertension: Secondary | ICD-10-CM

## 2020-11-06 DIAGNOSIS — W06XXXA Fall from bed, initial encounter: Secondary | ICD-10-CM

## 2020-11-06 DIAGNOSIS — R4182 Altered mental status, unspecified: Secondary | ICD-10-CM

## 2020-11-06 DIAGNOSIS — N179 Acute kidney failure, unspecified: Secondary | ICD-10-CM

## 2020-11-06 DIAGNOSIS — S12501A Unspecified nondisplaced fracture of sixth cervical vertebra, initial encounter for closed fracture: Secondary | ICD-10-CM

## 2020-11-06 LAB — BLOOD CULTURE ID PANEL (REFLEXED) - BCID2

## 2020-11-06 LAB — COMPREHENSIVE METABOLIC PANEL
ALT: 35 U/L (ref 0–44)
AST: 100 U/L — ABNORMAL HIGH (ref 15–41)
Albumin: 3.1 g/dL — ABNORMAL LOW (ref 3.5–5.0)
Alkaline Phosphatase: 62 U/L (ref 38–126)
Anion gap: 10 (ref 5–15)
BUN: 30 mg/dL — ABNORMAL HIGH (ref 8–23)
CO2: 25 mmol/L (ref 22–32)
Calcium: 9.3 mg/dL (ref 8.9–10.3)
Chloride: 104 mmol/L (ref 98–111)
Creatinine, Ser: 2.35 mg/dL — ABNORMAL HIGH (ref 0.44–1.00)
GFR, Estimated: 22 mL/min — ABNORMAL LOW (ref >60)
Glucose, Bld: 102 mg/dL — ABNORMAL HIGH (ref 70–99)
Potassium: 3.5 mmol/L (ref 3.5–5.1)
Sodium: 139 mmol/L (ref 135–145)
Total Bilirubin: 1 mg/dL (ref 0.3–1.2)
Total Protein: 6.1 g/dL — ABNORMAL LOW (ref 6.5–8.1)

## 2020-11-06 LAB — LIPID PANEL
Cholesterol: 130 mg/dL (ref 0–200)
HDL: 35 mg/dL — ABNORMAL LOW (ref >40)
LDL Cholesterol: 72 mg/dL (ref 0–99)
Total CHOL/HDL Ratio: 3.7 RATIO
Triglycerides: 117 mg/dL (ref <150)
VLDL: 23 mg/dL (ref 0–40)

## 2020-11-06 LAB — HIV ANTIBODY (ROUTINE TESTING W REFLEX): HIV Screen 4th Generation wRfx: NONREACTIVE

## 2020-11-06 LAB — GLUCOSE, CAPILLARY
Glucose-Capillary: 111 mg/dL — ABNORMAL HIGH (ref 70–99)
Glucose-Capillary: 110 mg/dL — ABNORMAL HIGH (ref 70–99)

## 2020-11-06 LAB — CBC
HCT: 40.3 % (ref 36.0–46.0)
Hemoglobin: 13.3 g/dL (ref 12.0–15.0)
MCH: 33.1 pg (ref 26.0–34.0)
MCHC: 33 g/dL (ref 30.0–36.0)
MCV: 100.2 fL — ABNORMAL HIGH (ref 80.0–100.0)
Platelets: 148 10*3/uL — ABNORMAL LOW (ref 150–400)
RBC: 4.02 MIL/uL (ref 3.87–5.11)
RDW: 12 % (ref 11.5–15.5)
WBC: 10.6 10*3/uL — ABNORMAL HIGH (ref 4.0–10.5)
nRBC: 0 % (ref 0.0–0.2)

## 2020-11-06 LAB — CBG MONITORING, ED
Glucose-Capillary: 85 mg/dL (ref 70–99)
Glucose-Capillary: 92 mg/dL (ref 70–99)

## 2020-11-06 LAB — CK
Total CK: 5152 U/L — ABNORMAL HIGH (ref 38–234)
Total CK: 4835 U/L — ABNORMAL HIGH (ref 38–234)

## 2020-11-06 LAB — HEMOGLOBIN A1C
Hgb A1c MFr Bld: 6 % — ABNORMAL HIGH (ref 4.8–5.6)
Mean Plasma Glucose: 125.5 mg/dL

## 2020-11-06 LAB — TSH: TSH: 1.245 u[IU]/mL (ref 0.350–4.500)

## 2020-11-06 MED ORDER — LACTATED RINGERS IV BOLUS
1000.0000 mL | Freq: Once | INTRAVENOUS | Status: AC
  Administered 2020-11-06: 1000 mL via INTRAVENOUS

## 2020-11-06 MED ORDER — DEUTETRABENAZINE 12 MG PO TABS
24.0000 mg | ORAL_TABLET | Freq: Two times a day (BID) | ORAL | Status: DC
  Administered 2020-11-06 – 2020-11-12 (×12): 24 mg via ORAL
  Filled 2020-11-06 (×13): qty 2

## 2020-11-06 MED ORDER — LACTATED RINGERS IV SOLN: INTRAVENOUS | Status: AC

## 2020-11-06 NOTE — ED Notes (Signed)
Assisted NT Jane with rolling pt and pulling up in bed after using bed pan

## 2020-11-06 NOTE — ED Notes (Signed)
NT Denise Serrano and this RN assisted pt to [facility]. Pt unable to use the bathroom at this time.    Attempted a 2nd IV d/t LAC IV continues to beep when pt bends her ar. Unsuccessful.

## 2020-11-06 NOTE — Procedures (Signed)
Patient Name: Getrude Cansler  MRN: [id number]  Epilepsy Attending: Lora Havens  Referring Physician/Provider: Dr Virl Axe Date: 11/06/2020  Duration: 25.23 mins  Patient history: 65 year old female with head trauma and altered mental status.  EEG to evaluate for seizures.  Level of alertness: Awake, asleep  AEDs during EEG study: GBP  Technical aspects: This EEG study was done with scalp electrodes positioned according to the 10-20 International system of electrode placement. Electrical activity was acquired at a sampling rate of '500Hz'$  and reviewed with a high frequency filter of '70Hz'$  and a low frequency filter of '1Hz'$ . EEG data were recorded continuously and digitally stored.   Description: The posterior dominant rhythm consists of 7.'5Hz'$  activity of moderate voltage (25-35 uV) seen predominantly in posterior head regions, symmetric and reactive to eye opening and eye closing. Sleep was characterized by vertex waves, sleep spindles (12 to 14 Hz), maximal frontocentral region.  EEG showed continuous generalized 5 to 7 Hz theta slowing. Hyperventilation and photic stimulation were not performed.     ABNORMALITY - Continuous slow, generalized  IMPRESSION: This study is suggestive of mild diffuse encephalopathy, nonspecific etiology. No seizures or epileptiform discharges were seen throughout the recording.  Arch Methot Barbra Sarks

## 2020-11-06 NOTE — Progress Notes (Signed)
Patient admitted to 5W30. VS stable, skin intact with small scattered bruises and blanchable redness on bilateral buttocks. Patient wearing C-collar and complaining of pain 3/10 in neck.  All questions answered and call bell placed within reach.

## 2020-11-06 NOTE — Progress Notes (Signed)
Per ED nursing report, patient has not urinated since the beginning of shift today (0700). I bladder scanned the patient and the result was 875m.  I have notified the MD and have been given verbal orders for an in/out cath now, then a bladder scan following to check residual.  We will then bladder scan Q8 and await orders for a foley if the patient still has not voided 8h post-in/out.

## 2020-11-06 NOTE — Progress Notes (Addendum)
Subjective: Patient reports feeling better than yesterday this am. She is still endorsing soreness and neck pain. She is otherwise not reporting any abdominal pain. In regards to her fall, she did not have any jerking movements or bladder incontinence after the event. However, pt seems to be a poor historian, so will investigate further. Pt updated for EEG plan today.   Objective: Physical exam: General: Person lying in bed in some discomfort.  CV: RRR with no murmurs appreciated.  Pulm: Regular work of breathing.  Abdomen: Soft and non-tender to palpation. No guarding. Extremities: Tenderness to palpation of lower extremities. No LEE.  Neuro: Alert and oriented. Good strength in upper extremities. CN 12 intact.   Vital signs in last 24 hours: Vitals:   11/06/20 0530 11/06/20 0600 11/06/20 0630 11/06/20 0739  BP: 120/67 112/64 121/74 (!) 142/116  Pulse: (!) 52 (!) 52 (!) 53 61  Resp: '10 11 11 14  '$ Temp:    (!) 97.4 F (36.3 C)  TempSrc:    Tympanic  SpO2: 98% 99% 99% 99%  Weight:      Height:       Weight change:   Intake/Output Summary (Last 24 hours) at 11/06/2020 0820 Last data filed at 11/06/2020 0758 Gross per 24 hour  Intake 2937.15 ml  Output --  Net 2937.15 ml   CBC Latest Ref Rng & Units 11/06/2020 11/05/2020 11/17/2019  WBC 4.0 - 10.5 K/uL 10.6(H) 11.6(H) 7.9  Hemoglobin 12.0 - 15.0 g/dL 13.3 14.8 14.3  Hematocrit 36.0 - 46.0 % 40.3 44.8 43.3  Platelets 150 - 400 K/uL 148(L) 167 147(L)   CMP Latest Ref Rng & Units 11/06/2020 11/05/2020 11/17/2019  Glucose 70 - 99 mg/dL 102(H) 86 104(H)  BUN 8 - 23 mg/dL 30(H) 34(H) 27(H)  Creatinine 0.44 - 1.00 mg/dL 2.35(H) 3.03(H) 1.87(H)  Sodium 135 - 145 mmol/L 139 141 140  Potassium 3.5 - 5.1 mmol/L 3.5 3.8 4.3  Chloride 98 - 111 mmol/L 104 101 102  CO2 22 - 32 mmol/L '25 28 30  '$ Calcium 8.9 - 10.3 mg/dL 9.3 10.3 9.5  Total Protein 6.5 - 8.1 g/dL 6.1(L) 7.0 -  Total Bilirubin 0.3 - 1.2 mg/dL 1.0 1.2 -  Alkaline Phos 38 - 126  U/L 62 77 -  AST 15 - 41 U/L 100(H) 76(H) -  ALT 0 - 44 U/L 35 32 -   Component     Latest Ref Rng & Units 11/06/2020  Cholesterol     0 - 200 mg/dL 130  Triglycerides     <150 mg/dL 117  HDL Cholesterol     >40 mg/dL 35 (L)  Total CHOL/HDL Ratio     RATIO 3.7  VLDL     0 - 40 mg/dL 23  LDL (calc)     0 - 99 mg/dL 72   Component     Latest Ref Rng & Units 11/06/2020  Hemoglobin A1C     4.8 - 5.6 % 6.0 (H)   Component     Latest Ref Rng & Units 04/06/2019 11/06/2020  TSH     0.350 - 4.500 uIU/mL 3.257 1.245   Component     Latest Ref Rng & Units 11/05/2020 11/06/2020  CK Total     38 - 234 U/L 4,636 (H) 5,152 (H)   BCX: growing MRSE likely contaminant   EEG 8/17: This study is suggestive of mild diffuse encephalopathy, nonspecific etiology. No seizures or epileptiform discharges were seen throughout the recording.  Assessment/Plan:  Principal Problem:   C6 cervical fracture (HCC) Active Problems:   Type 2 diabetes mellitus with diabetic chronic kidney disease ([place])   AKI (acute kidney injury) ([place])   Bipolar disorder ([place])   Essential hypertension   Hypothyroidism   Hyperlipidemia   Asthma exacerbation   CKD (chronic kidney disease) stage 4, GFR 15-29 ml/min (HCC)   Accidental fall from bed   Non-displaced fracture of sub-axial cervical spine  Fall Polypharmacy  Pt with recent fall from bed, found to have a nondisplaced C6 facet and anterior osteophyte fracture on CT. Neurosurgery recommends Aspen collar and outpatient follow up in 2 weeks. Given rising CK, concerned for a seizure. EEG on 8/17 showed nonspecific findings of mild diffuse encephalopathy, but unable to rule out isolated seizure. Psych consulted this am for concern of polypharmacy however they do not recommend further adjustment of her medications at the time. Will resume her PRN trazodone today. Of note, patient does have DEXA scan in 2017 showing osteoporosis (T score < -2.5 at spine and hip) so this  may be fragility fracture. - Trend CK - pain: dilaudid 0.5 q4 prn for severe pain - PT/OT consulted awaiting recs  - Outpatient: f/u with neurosurg in 2 wks  Acute on CKD G4  Rhabdomyolysis  Pt with elevated creatinine of 3.03->2.35 (bl~1-2) and BUN of 30. UA with myoglobinuria and CK elevated at 5,152, suggestive of rhabdo. No electrolyte derangement at the moment. She is s/p 2L LR bolus and on maintenance LR fluids. - daily CMP - trend CK - continue LR 100 ml/hr x 12 hrs  Recent COVID Infection Tested positive on 8/3; noted decreased appetite following but no current symptoms in ED. Outside the quarantine period.    Diet controlled with T2DM Last A1c in 2016 at 7.0. Recent A1c of 6. Not on home medications. - SSI   Essential HTN SBP ranging in 109-148 mmHg. Not taking home amlodipine.    HLD On home atorvastatin with last LDL of 34 and HDL 29 in 2016. Recent lipid panel with LDL of 72 and HDL of 35.  - Continue home Atorvastatin 20 mg daily   Hypothyroidism Last TSH normal at 3.257 and T4 at 0.88. Recent TSH wnl at 1.245. Will resume home levothyroxine.  - Continue Levothyroxine 50 mcg daily   Bipolar I disorder s/p ECT ECT on 10/16/2020. Psych consulted and they recommend continuing home medications while inpatient.  - continue Cariprazine 3 mg daily and Lumateperone tosylate 42 mg daily    Mood disorder (depression/anxiety) On home Alprazolam 0.5 mg tid, Bupropion 300 mg daily and Gabapentin 300 mg daily.  - continue home gabapentin and bupropion    Diet: HH VTE: enoxaparin  Code: full    Dispo: Admit patient to Observation with expected length of stay less than 2 midnights.    LOS: 1 day   Gilford Silvius T, Medical Student 11/06/2020, 8:20 AM  Attestation for Student Documentation:  I personally was present and performed or re-performed the history, physical exam and medical decision-making activities of this service and have verified that the service and  findings are accurately documented in the student's note.  Virl Axe, MD 11/06/2020, 2:36 PM

## 2020-11-06 NOTE — Progress Notes (Signed)
EEG complete - results pending 

## 2020-11-06 NOTE — Evaluation (Signed)
Physical Therapy Evaluation Patient Details Name: Denise Serrano MRN: [id number] DOB: 1955-06-10 Today's Date: 11/06/2020   History of Present Illness  65 yo female with onset of fall OOB to floor and subsequent trip to hosp was found to have C6 fracture, nondisplaced and AMS, AKI.  Has significant other who cannot care for her and is asking about LTC.  PMHx:  recent Covid, CKD4, bipolar disorder, DM, HTN, HLD, hypothyroidism, CPAP, asthma, AKI, ECT,  Clinical Impression  Pt is evaluated and has limited tolerance for mobility.  Had O2 sat of 97% in sitting , light headed but not orthostatic per BP of  114/70.  Pt is fearful to move and actively resisting at times.  Recommend therapy to continue to move her, and get her to a chair as tolerated, resume gait as she is willing to try.  Premedication for therapy recommended.    Follow Up Recommendations SNF    Equipment Recommendations  None recommended by PT    Recommendations for Other Services       Precautions / Restrictions Precautions Precautions: Fall Precaution Comments: dizzy with sitting Required Braces or Orthoses: Cervical Brace Cervical Brace: Hard collar;At all times Restrictions Weight Bearing Restrictions: No Other Position/Activity Restrictions: spinal body mechanics      Mobility  Bed Mobility Overal bed mobility: Needs Assistance Bed Mobility: Supine to Sit;Sit to Supine     Supine to sit: Max assist Sit to supine: Max assist   General bed mobility comments: pt is afraid to move, tends to extend legs and trunk to avoid being assisted up    Transfers                 General transfer comment: pt is not safe to stand, has UE shaking and reporting light headed feelings  Ambulation/Gait             General Gait Details: unable to try safely  Stairs            Wheelchair Mobility    Modified Rankin (Stroke Patients Only)       Balance Overall balance assessment: History of  Falls;Needs assistance Sitting-balance support: Feet supported;Bilateral upper extremity supported Sitting balance-Leahy Scale: Poor Sitting balance - Comments: requires verbal and tactile cues                                     Pertinent Vitals/Pain Pain Assessment: Faces Faces Pain Scale: Hurts even more Pain Location: neck, shoulders, upper L arm Pain Descriptors / Indicators: Aching;Grimacing;Guarding;Tender Pain Intervention(s): Limited activity within patient's tolerance;Monitored during session;Repositioned;Premedicated before session;Patient requesting pain meds-RN notified    Home Living Family/patient expects to be discharged to:: Skilled nursing facility Living Arrangements: Spouse/significant other Available Help at Discharge: Family;Available 24 hours/day Type of Home: House         Home Equipment: Walker - 2 wheels;Hospital bed Additional Comments: pt is overwhelmed and limited history obtained    Prior Function Level of Independence: Needs assistance   Gait / Transfers Assistance Needed: was ambulatory with no AD  ADL's / Homemaking Assistance Needed: unclear how much her husband helped if at all        Hand Dominance   Dominant Hand: Right    Extremity/Trunk Assessment   Upper Extremity Assessment Upper Extremity Assessment: Generalized weakness    Lower Extremity Assessment Lower Extremity Assessment: Generalized weakness    Cervical / Trunk Assessment Cervical / Trunk  Assessment: Other exceptions (C6 undisplaced fracture) Cervical / Trunk Exceptions: C6 non surgical fracture  Communication   Communication: No difficulties  Cognition Arousal/Alertness: Lethargic Behavior During Therapy: Anxious Overall Cognitive Status: No family/caregiver present to determine baseline cognitive functioning                                 General Comments: unclear if this is baseline      General Comments General comments  (skin integrity, edema, etc.): pt is able to sit wiht help but is not safe to stand due to her pain complaints, tendency to have UE shaking and structure of the bed in ED    Exercises     Assessment/Plan    PT Assessment Patient needs continued PT services  PT Problem List Decreased strength;Decreased activity tolerance;Decreased balance;Decreased mobility;Decreased coordination;Decreased cognition;Decreased safety awareness;Decreased skin integrity;Pain       PT Treatment Interventions DME instruction;Gait training;Functional mobility training;Therapeutic activities;Therapeutic exercise;Balance training;Neuromuscular re-education;Patient/family education    PT Goals (Current goals can be found in the Care Plan section)  Acute Rehab PT Goals Patient Stated Goal: none stated PT Goal Formulation: Patient unable to participate in goal setting Time For Goal Achievement: 11/20/20 Potential to Achieve Goals: Fair    Frequency Min 2X/week   Barriers to discharge Other (comment) her significant other cannot care for her any longer    Co-evaluation               AM-PAC PT "6 Clicks" Mobility  Outcome Measure Help needed turning from your back to your side while in a flat bed without using bedrails?: A Lot Help needed moving from lying on your back to sitting on the side of a flat bed without using bedrails?: A Lot Help needed moving to and from a bed to a chair (including a wheelchair)?: Total Help needed standing up from a chair using your arms (e.g., wheelchair or bedside chair)?: Total Help needed to walk in hospital room?: Total Help needed climbing 3-5 steps with a railing? : Total 6 Click Score: 8    End of Session Equipment Utilized During Treatment: Oxygen Activity Tolerance: Patient limited by pain Patient left: in bed;with call bell/phone within reach Nurse Communication: Mobility status;Other (comment) (discharge plans) PT Visit Diagnosis: Muscle weakness  (generalized) (M62.81);Other abnormalities of gait and mobility (R26.89);Pain Pain - Right/Left:  (BUE's, neck and B shoulders) Pain - part of body: Shoulder;Arm (neck)    Time: [id number] PT Time Calculation (min) (ACUTE ONLY): 20 min   Charges:   PT Evaluation $PT Eval Moderate Complexity: 1 Mod         Ramond Dial 11/06/2020, 2:59 PM  Mee Hives, PT MS Acute Rehab Dept. Number: [place] and [place]

## 2020-11-06 NOTE — Progress Notes (Signed)
  Date: 11/06/2020  Patient name: Denise Serrano  Medical record number: [id number]  Date of birth: 06-16-1955   I have seen and evaluated Denise Serrano and discussed their care with the Residency Team. Briefly, Denise Serrano is a 65 year old woman who presented after a fall out of bed.  Was found to have a C6 fracture.  She noted getting up and going to wait on her significant other to help her.  She has had decreased PO Intake since her COVID infection.  She take an anxiolytic the night before the fall to help her sleep. She is in a C collar at this time.  It is unclear how long she was down.  Her significant other found her around 8am.  She was noted to have an elevated CK At 4600, she had AKI with BUN/Cr ratio < 20.  UA showed signs of myoglobinuria.  She had an AST of 100 with normal ALT and ALP this AM   Vitals:   11/06/20 0739 11/06/20 0800  BP: (!) 142/116 117/66  Pulse: 61 (!) 54  Resp: 14 (!) 9  Temp: (!) 97.4 F (36.3 C)   SpO2: 99% 96%   Gen: Tremulous, tearful, otherwise in NAD Eyes: + conjunctival injection bilaterally, no icterus HENT:  C collar in place CV: Bradycardic, regular, no pedal edema Pulm: Breathing comfortably on 3LNC, no wheezing Abd: Soft, +BS MSK: She has TTP over the muscle bodies of the lower legs and the upper arms.  She has slowed ROM, but it was normal.  Neuro: She is 4+/5 in the upper extremities and with shrug.  She is 0/5 with plantarflexion, but she noted that this hurt her.  She is 5/5 with dorsiflexion.  Psych: Tearful, alert, oriented to situation  Assessment and Plan: I have seen and evaluated the patient as outlined above. I agree with the formulated Assessment and Plan as detailed in the residents' note, with the following changes:   C6 fracture, fall, polypharmacy - Fall out of bed, given findings and elevated CK, will check EEG for seizures - Continue C collar - Follow NSG recommendations - PT/OT consulted - Psych consulted to consider  change in medications  AKI on CKD Rhabdomyolysis  - LR IVF - Trend CK - Trend renal function - Would not d/c fluids until CK steadily trending down  Other issues per resident team daily note.  Sid Falcon, MD 8/17/202210:36 AM

## 2020-11-06 NOTE — Progress Notes (Signed)
In and out cath performed. Approx. 819m clear yellow urine returned. Post-cath bladder scan revealed 019mresidual.   Will pass along to night shift RN that if patient has not voided by 0015 tonight, another bladder scan should be performed.

## 2020-11-06 NOTE — ED Notes (Signed)
Pt requesting to put her rings back on. One silver colored ring was on Left ring finger. This RN found another silver colored ring in a urine cup in her pt's belonging bag, pt placed that said ring on her Right ring finger

## 2020-11-06 NOTE — Consult Note (Signed)
Patient presents from home after being found down at home, unknown downtime on the floor.  psych consult was placed for assistance with authorization of medications(history of bipolar disease, depression, anxiety).  Patient is currently receiving psychiatric services at [facility] behavioral health outpatient in [place] with Dr. Georgiann Mohs.  She is also undergoing therapy for ECT, under the service of Dr. Alethia Berthold.  Chart review shows patient his home medications have been resumed to include Wellbutrin XL 300 mg, Vraylar 3 mg, Duetrabenzine 28 p.o. twice daily.  There seems to be some confusion regarding her use and compliance alprazolam 0.5 mg, last prescription was received October 28, 2020.  It remains unclear if her fall was related to possible benzodiazepine use or withdrawal, her urine drug screen on admission was positive for benzodiazepines. Her patient's current medication regimen is appropriate for her underlying bipolar disorder.  As noted she is currently receiving services via outpatient psychiatry, and will not adjust at this time.  Patient is seen and assessed by this nurse practitioner, patient denies any psychiatric symptoms at this time. She is more focused on her pain relief and her neck fracture. She is observed lying flat in the bed with a C_collar on, and has been cleared by neurosurgery to follow-up outpatient.    Bipolar disorder: Patient is currently being managed by outpatient psychiatry, per chart review has been visiting weekly.  She last received ECT treatment 1 week ago on August 8, in which this session was discontinued due to no improvement in her depressive symptoms.  We will continue home medications, as patient is denying any psychiatric symptoms at this time.  -Will recommend avoiding any additional antipsychotics, as patient has elevated CK of 4636 on admission.  As previously noted this could also be due to her fall, unknown downtime on the floor.  -

## 2020-11-06 NOTE — ED Notes (Signed)
Spouse brought Home meds pharmacy downstairs. Medications verified by Otto Herb and this RN. reminder medication will be in ED pharmacy until pt is transferred upstairs. Please ask pharmacy for next dose

## 2020-11-06 NOTE — Progress Notes (Signed)
PHARMACY - PHYSICIAN COMMUNICATION CRITICAL VALUE ALERT - BLOOD CULTURE IDENTIFICATION (BCID)  Denise Serrano is an 65 y.o. female who presented to [facility] on 11/05/2020 with a chief complaint of fall/neck pain  Assessment:  1/2 blood cultures growing MR-Staph epidermidis.  WBC 10.6, Afebrile, likely contaminant  Name of physician (or Provider) Contacted:  Dr. Marva Panda  Current antibiotics: None  Changes to prescribed antibiotics recommended:  No changes at this time   Results for orders placed or performed during the hospital encounter of 11/05/20  Blood Culture ID Panel (Reflexed) (Collected: 11/05/2020  1:40 PM)  Result Value Ref Range   Enterococcus faecalis NOT DETECTED NOT DETECTED   Enterococcus Faecium NOT DETECTED NOT DETECTED   Listeria monocytogenes NOT DETECTED NOT DETECTED   Staphylococcus species DETECTED (A) NOT DETECTED   Staphylococcus aureus (BCID) NOT DETECTED NOT DETECTED   Staphylococcus epidermidis DETECTED (A) NOT DETECTED   Staphylococcus lugdunensis NOT DETECTED NOT DETECTED   Streptococcus species NOT DETECTED NOT DETECTED   Streptococcus agalactiae NOT DETECTED NOT DETECTED   Streptococcus pneumoniae NOT DETECTED NOT DETECTED   Streptococcus pyogenes NOT DETECTED NOT DETECTED   A.calcoaceticus-baumannii NOT DETECTED NOT DETECTED   Bacteroides fragilis NOT DETECTED NOT DETECTED   Enterobacterales NOT DETECTED NOT DETECTED   Enterobacter cloacae complex NOT DETECTED NOT DETECTED   Escherichia coli NOT DETECTED NOT DETECTED   Klebsiella aerogenes NOT DETECTED NOT DETECTED   Klebsiella oxytoca NOT DETECTED NOT DETECTED   Klebsiella pneumoniae NOT DETECTED NOT DETECTED   Proteus species NOT DETECTED NOT DETECTED   Salmonella species NOT DETECTED NOT DETECTED   Serratia marcescens NOT DETECTED NOT DETECTED   Haemophilus influenzae NOT DETECTED NOT DETECTED   Neisseria meningitidis NOT DETECTED NOT DETECTED   Pseudomonas aeruginosa NOT DETECTED NOT  DETECTED   Stenotrophomonas maltophilia NOT DETECTED NOT DETECTED   Candida albicans NOT DETECTED NOT DETECTED   Candida auris NOT DETECTED NOT DETECTED   Candida glabrata NOT DETECTED NOT DETECTED   Candida krusei NOT DETECTED NOT DETECTED   Candida parapsilosis NOT DETECTED NOT DETECTED   Candida tropicalis NOT DETECTED NOT DETECTED   Cryptococcus neoformans/gattii NOT DETECTED NOT DETECTED   Methicillin resistance mecA/C DETECTED (A) NOT DETECTED    Caryl Pina 11/06/2020  7:20 AM

## 2020-11-06 NOTE — ED Notes (Addendum)
Attempted report 

## 2020-11-07 LAB — COMPREHENSIVE METABOLIC PANEL
ALT: 36 U/L (ref 0–44)
AST: 88 U/L — ABNORMAL HIGH (ref 15–41)
Albumin: 2.7 g/dL — ABNORMAL LOW (ref 3.5–5.0)
Alkaline Phosphatase: 54 U/L (ref 38–126)
Anion gap: 6 (ref 5–15)
BUN: 24 mg/dL — ABNORMAL HIGH (ref 8–23)
CO2: 27 mmol/L (ref 22–32)
Calcium: 8.8 mg/dL — ABNORMAL LOW (ref 8.9–10.3)
Chloride: 107 mmol/L (ref 98–111)
Creatinine, Ser: 1.82 mg/dL — ABNORMAL HIGH (ref 0.44–1.00)
GFR, Estimated: 30 mL/min — ABNORMAL LOW (ref >60)
Glucose, Bld: 96 mg/dL (ref 70–99)
Potassium: 3.8 mmol/L (ref 3.5–5.1)
Sodium: 140 mmol/L (ref 135–145)
Total Bilirubin: 0.8 mg/dL (ref 0.3–1.2)
Total Protein: 5.4 g/dL — ABNORMAL LOW (ref 6.5–8.1)

## 2020-11-07 LAB — CBC
HCT: 39.4 % (ref 36.0–46.0)
Hemoglobin: 13.6 g/dL (ref 12.0–15.0)
MCH: 33.7 pg (ref 26.0–34.0)
MCHC: 34.5 g/dL (ref 30.0–36.0)
MCV: 97.5 fL (ref 80.0–100.0)
Platelets: 157 10*3/uL (ref 150–400)
RBC: 4.04 MIL/uL (ref 3.87–5.11)
RDW: 11.9 % (ref 11.5–15.5)
WBC: 9.2 10*3/uL (ref 4.0–10.5)
nRBC: 0 % (ref 0.0–0.2)

## 2020-11-07 LAB — GLUCOSE, CAPILLARY
Glucose-Capillary: 100 mg/dL — ABNORMAL HIGH (ref 70–99)
Glucose-Capillary: 90 mg/dL (ref 70–99)
Glucose-Capillary: 136 mg/dL — ABNORMAL HIGH (ref 70–99)
Glucose-Capillary: 90 mg/dL (ref 70–99)

## 2020-11-07 LAB — CK: Total CK: 4029 U/L — ABNORMAL HIGH (ref 38–234)

## 2020-11-07 MED ORDER — ENSURE ENLIVE PO LIQD
237.0000 mL | Freq: Three times a day (TID) | ORAL | Status: DC
  Administered 2020-11-09 – 2020-11-12 (×5): 237 mL via ORAL

## 2020-11-07 MED ORDER — LACTATED RINGERS IV BOLUS
1000.0000 mL | Freq: Once | INTRAVENOUS | Status: AC
  Administered 2020-11-07: 1000 mL via INTRAVENOUS

## 2020-11-07 MED ORDER — ADULT MULTIVITAMIN W/MINERALS CH
1.0000 | ORAL_TABLET | Freq: Every day | ORAL | Status: DC
  Administered 2020-11-07 – 2020-11-12 (×6): 1 via ORAL
  Filled 2020-11-07 (×6): qty 1

## 2020-11-07 NOTE — Progress Notes (Addendum)
Subjective: Patient reports that she is having some pain in her neck and leg this morning. She thinks her pain is not getting worse and she suspects that the neck pain is from the C-collar. She other wise reporting no other symptoms. She is agreeable with the SNF plan and thinks that should could benefit from rehab. Significant other, Denise Serrano, updated on Denise Serrano today. He is agreeable with our plan for discharge to SNF. He also shared that Denise Serrano never tested positive for COVID prior to her admission here at [facility].   Objective: Physical exam: General: Person lying in bed with some discomfort  CV: RRR S1S2 Abdomen: Soft and non-distended Extremities: LE tender to palpation but good sensation to temperature bilaterally. No pitting edema.  Neuro: Alert and oriented. Left grip strength lower than right. Able to raise shoulders equally. UE strength equal.   Vital signs in last 24 hours: Vitals:   11/06/20 1955 11/07/20 0000 11/07/20 0428 11/07/20 0726  BP: 108/72 (!) 104/59 140/61 134/90  Pulse: 65 (!) 55 (!) 58 70  Resp: '15 15 14 18  '$ Temp: 98.8 F (37.1 C) 98.8 F (37.1 C) 97.8 F (36.6 C) 97.6 F (36.4 C)  TempSrc: Oral Oral Oral Oral  SpO2: 92% 91% 92% 96%  Weight:      Height:       Weight change:   Intake/Output Summary (Last 24 hours) at 11/07/2020 1024 Last data filed at 11/07/2020 0300 Gross per 24 hour  Intake --  Output 1250 ml  Net -1250 ml   CBC Latest Ref Rng & Units 11/07/2020 11/06/2020 11/05/2020  WBC 4.0 - 10.5 K/uL 9.2 10.6(H) 11.6(H)  Hemoglobin 12.0 - 15.0 g/dL 13.6 13.3 14.8  Hematocrit 36.0 - 46.0 % 39.4 40.3 44.8  Platelets 150 - 400 K/uL 157 148(L) 167   CMP Latest Ref Rng & Units 11/07/2020 11/06/2020 11/05/2020  Glucose 70 - 99 mg/dL 96 102(H) 86  BUN 8 - 23 mg/dL 24(H) 30(H) 34(H)  Creatinine 0.44 - 1.00 mg/dL 1.82(H) 2.35(H) 3.03(H)  Sodium 135 - 145 mmol/L 140 139 141  Potassium 3.5 - 5.1 mmol/L 3.8 3.5 3.8  Chloride 98 -  111 mmol/L 107 104 101  CO2 22 - 32 mmol/L '27 25 28  '$ Calcium 8.9 - 10.3 mg/dL 8.8(L) 9.3 10.3  Total Protein 6.5 - 8.1 g/dL 5.4(L) 6.1(L) 7.0  Total Bilirubin 0.3 - 1.2 mg/dL 0.8 1.0 1.2  Alkaline Phos 38 - 126 U/L 54 62 77  AST 15 - 41 U/L 88(H) 100(H) 76(H)  ALT 0 - 44 U/L 36 35 32    Component     Latest Ref Rng & Units 11/06/2020 11/06/2020 11/07/2020         2:50 AM  8:38 PM   CK Total     38 - 234 U/L 5,152 (H) 4,835 (H) 4,029 (H)   BCX: GPC- growing MRSE likely contaminant   EEG 8/17: This study is suggestive of mild diffuse encephalopathy, nonspecific etiology. No seizures or epileptiform discharges were seen throughout the recording.  Assessment/Plan:  Principal Problem:   C6 cervical fracture (HCC) Active Problems:   Type 2 diabetes mellitus with diabetic chronic kidney disease ([place])   AKI (acute kidney injury) ([place])   Bipolar disorder ([place])   Essential hypertension   Hypothyroidism   Hyperlipidemia   Asthma exacerbation   CKD (chronic kidney disease) stage 4, GFR 15-29 ml/min (HCC)   Accidental fall from bed   Non-displaced fracture of sub-axial  cervical spine  Fall Polypharmacy  Pt with recent fall from bed, found to have a nondisplaced C6 facet and anterior osteophyte fracture on CT. Neurosurgery recommends Aspen collar and outpatient follow up in 2 weeks. Given rising CK, concerned for a seizure. EEG on 8/17 showed nonspecific findings of mild diffuse encephalopathy, but unable to rule out isolated seizure. Psych consulted on 8/17 for concern of polypharmacy however they do not recommend further adjustment of her medications at the time. Of note, patient does have DEXA scan in 2017 showing osteoporosis (T score < -2.5 at spine and hip) so this may be fragility fracture. Working on SNF placement for improved strength and balance. Further, can consider treatment with Vitamin D and bisphosphate therapy in the outpatient setting.  - CK on 8/19 am - pain: dilaudid 0.5  q4 prn for severe pain - PT/OT  recommending SNF: TOC consulted awaiting recs  - Outpatient: f/u with neurosurg in 2 wks and can consider bisphosphonate therapy   Acute on CKD G4  Rhabdomyolysis  Pt with elevated creatinine of 3.03->2.35->1.82 (bl~1-2) and BUN of 30. UA with myoglobinuria and CK elevated at 5,152, suggestive of rhabdo. No electrolyte derangement at the moment. She is s/p 3L LR bolus and on maintenance LR fluids. Last CK down-trending but still elevated at 4029 - daily CMP - trend CK - continue LR 100 ml/hr x 12 hrs  Recent COVID Infection Tested positive during admission on 8/16. No URI symptoms.    Diet controlled with T2DM Last A1c in 2016 at 7.0. Recent A1c of 6. Not on home medications. - SSI   Essential HTN SBP ranging in 109-148 mmHg. Not taking home amlodipine.    HLD On home atorvastatin with last LDL of 34 and HDL 29 in 2016. Recent lipid panel with LDL of 72 and HDL of 35. Holding home Atorvastatin 20 mg daily in setting of her elevated CK, concerning for rhabdomyolysis.    Hypothyroidism Last TSH normal at 3.257 and T4 at 0.88. Recent TSH wnl at 1.245. Will resume home levothyroxine.  - Continue Levothyroxine 50 mcg daily   Bipolar I disorder s/p ECT ECT on 10/16/2020. Psych consulted and they recommend continuing home medications while inpatient.  - continue Cariprazine 3 mg daily and Lumateperone tosylate 42 mg daily    Mood disorder (depression/anxiety) On home Alprazolam 0.5 mg tid, Bupropion 300 mg daily and Gabapentin 300 mg daily.  - continue home gabapentin and bupropion    Diet: HH VTE: enoxaparin  Code: full    Dispo: Admit patient to Observation with expected stay until the 25th.     LOS: 2 days   Gilford Silvius T, Medical Student 11/07/2020, 10:25 AM  Attestation for Student Documentation:  I personally was present and performed or re-performed the history, physical exam and medical decision-making activities of this service  and have verified that the service and findings are accurately documented in the student's note.  Virl Axe, MD 11/07/2020, 2:46 PM

## 2020-11-07 NOTE — Progress Notes (Signed)
Patient on isolation for COVID.  CPAP not placed on patient at this time.

## 2020-11-07 NOTE — Plan of Care (Signed)
  Problem: Education: Goal: Knowledge of General Education information will improve Description Including pain rating scale, medication(s)/side effects and non-pharmacologic comfort measures Outcome: Progressing   

## 2020-11-07 NOTE — TOC Initial Note (Signed)
Transition of Care [facility]) - Initial/Assessment Note    Patient Details  Name: Denise Serrano MRN: [id number] Date of Birth: 1955-11-04  Transition of Care [facility] CM/SW Contact:    Benard Halsted, LCSW Phone Number: 11/07/2020, 4:44 PM  Clinical Narrative:                 CSW received consult for possible SNF placement at time of discharge. CSW spoke with patient's significant other. He reported he is currently unable to care for patient at their home given patient's current physical needs and fall risk and he does not want to get sick. Patient expressed understanding of PT recommendation and is agreeable to SNF placement at time of discharge. CSW discussed insurance authorization process. The current barrier is that no SNF beds are available to accept a COVID + patient. CSW will continue to follow until Ron[facility] Lion has a bed available or until patient is out of her isolation window.   Skilled Nursing Rehab Facilities-   [email or web address] *Ratings updated quarterly   Ratings out of 5 possible   Name Address  Phone # [place] Inspection Overall  [facility] [address] [place] '5  4 4  '$ [facility]  [address] [place], [place] [phone] '4 2 4 4  '$ [facility] [place], [place] '2 3 1 1  '$ [facility], [place] '3 3 4 4  '$ [facility] [address] [place] '3  2 2  '$ [facility] [address] [place] [phone] '2 2 4 4  '$ [facility] [address] [place] '5 2 2 3  '$ [facility] [address] [place] '4 2 2 2  '$ [facility] [place] '5 2 2 3  '$ [facility] [phone], [place] [phone] '5 1 2 2  '$ [facility] [address] [facility] [phone] '5 2 2 3  '$ [place]  [address] [place] [id number] '3 1 1 1     '$ Expected Discharge Plan: [place] Barriers to Discharge: SNF Covid, Continued Medical Work up, SNF Pending bed offer   Patient Goals and CMS Choice Patient states their goals for this hospitalization and ongoing recovery are:: Rehab CMS Medicare.gov Compare Post Acute Care list provided to:: Patient Choice offered to / list presented to : Patient, Spouse  Expected Discharge Plan and Services Expected Discharge Plan: [place] In-house Referral: Clinical Social Work   Post Acute Care Choice: [place] Living arrangements for the past 2 months: [place]                                      Prior Living Arrangements/Services Living arrangements for the past 2 months: Single Family Home Lives with:: Significant Other Patient language and need for interpreter reviewed:: Yes Do you feel safe going back to the place where you live?: Yes      Need for Family Participation in Patient Care: Yes (Comment) Care giver support system in place?: Yes (comment)   Criminal Activity/Legal Involvement Pertinent to Current Situation/Hospitalization: No - Comment as needed  Activities of Daily Living   ADL Screening (condition at time of admission) Patient's cognitive ability adequate to safely complete daily activities?: Yes Is the patient deaf or have difficulty hearing?: Yes Does the  patient have difficulty seeing, even when wearing glasses/contacts?: No Does the patient have difficulty concentrating, remembering, or making decisions?: No Patient able to express need for assistance with ADLs?: Yes Does the patient have difficulty dressing or bathing?: Yes Independently performs ADLs?: Yes (appropriate for developmental age) Does the patient have difficulty walking or climbing stairs?: Yes Weakness of Legs: Both Weakness of Arms/Hands: Both  Permission  Sought/Granted Permission sought to share information with : Facility Sport and exercise psychologist, Family Supports Permission granted to share information with : Yes, Verbal Permission Granted  Share Information with NAME: [name]  Permission granted to share info w AGENCY: SNFs  Permission granted to share info w Relationship: Significant other  Permission granted to share info w Contact Information: [phone]  Emotional Assessment   Attitude/Demeanor/Rapport: Engaged   Orientation: : Oriented to Self, Oriented to Place, Oriented to  Time, Oriented to Situation Alcohol / Substance Use: Not Applicable Psych Involvement: No (comment)  Admission diagnosis:  AKI (acute kidney injury) ([place]) [N17.9] Fall, initial encounter [W19.XXXA] COVID [U07.1] Patient Active Problem List   Diagnosis Date Noted   CKD (chronic kidney disease) stage 4, GFR 15-29 ml/min ([place]) 11/05/2020   C6 cervical fracture ([place]) 11/05/2020   Accidental fall from bed 11/05/2020   Anemia, iron deficiency 0000000   Metabolic acidosis 123456   Anemia of renal disease 07/01/2014   Acute renal failure ([place]) 06/28/2014   Asthma exacerbation 06/28/2014   Dizziness 06/28/2014   GERD (gastroesophageal reflux disease) 06/28/2014   AKI (acute kidney injury) ([place]) 05/03/2014   Bipolar disorder ([place]) 05/03/2014   Essential hypertension 05/03/2014   Diarrhea of presumed infectious origin 05/03/2014   Hypothyroidism 05/03/2014   Hyperlipidemia 05/03/2014   Gait difficulty 11/15/2013   Type 2 diabetes mellitus with diabetic chronic kidney disease ([place]) 11/14/2013   Diabetic polyneuropathy associated with type 2 diabetes mellitus ([place]) 11/14/2013   Ingrown left big toenail 10/22/2010   PCP:  Katherina Mires, MD Pharmacy:   [id number]: [phone]: [phone]    Social Determinants of Health (SDOH)  Interventions    Readmission Risk Interventions No flowsheet data found.

## 2020-11-07 NOTE — Evaluation (Signed)
Occupational Therapy Evaluation Patient Details Name: Denise Serrano MRN: [id number] DOB: November 01, 1955 Today's Date: 11/07/2020    History of Present Illness 65 yo female with onset of fall OOB to floor and subsequent trip to hosp was found to have C6 fracture, nondisplaced and AMS, AKI.  Has significant other who cannot care for her and is asking about LTC.  PMHx:  recent Covid, CKD4, bipolar disorder, DM, HTN, HLD, hypothyroidism, CPAP, asthma, AKI, ECT,   Clinical Impression   Pt was independent prior to admission. She resides with her husband, but his ability to care for her is questionable. Pt presents with cervical pain and anxiety. She requires supervision to mod assist for bed mobility, min to min guard assist for OOB with RW and up to min assist for ADL. Recommending ST rehab in SNF prior to return home.     Follow Up Recommendations  SNF;Supervision/Assistance - 24 hour    Equipment Recommendations  3 in 1 bedside commode    Recommendations for Other Services       Precautions / Restrictions Precautions Precautions: Fall Required Braces or Orthoses: Cervical Brace Cervical Brace: Hard collar;At all times      Mobility Bed Mobility Overal bed mobility: Needs Assistance Bed Mobility: Rolling;Sidelying to Sit;Sit to Sidelying Rolling: Supervision Sidelying to sit: Supervision     Sit to sidelying: Mod assist General bed mobility comments: cues for log roll technique, assist for LEs back into bed    Transfers Overall transfer level: Needs assistance Equipment used: Rolling walker (2 wheeled) Transfers: Sit to/from Stand Sit to Stand: Min guard;Min assist         General transfer comment: min initially, progressed to min guard, cues for hand placement    Balance Overall balance assessment: History of Falls;Needs assistance Sitting-balance support: Feet supported;Bilateral upper extremity supported Sitting balance-Leahy Scale: Good Sitting balance - Comments:  no LOB donning socks   Standing balance support: Bilateral upper extremity supported Standing balance-Leahy Scale: Fair Standing balance comment: can release walker in static standing                           ADL either performed or assessed with clinical judgement   ADL Overall ADL's : Needs assistance/impaired Eating/Feeding: Independent;Bed level   Grooming: Minimal assistance;Standing;Wash/dry hands   Upper Body Bathing: Minimal assistance;Sitting   Lower Body Bathing: Minimal assistance;Sit to/from stand   Upper Body Dressing : Minimal assistance;Sitting   Lower Body Dressing: Minimal assistance;Sit to/from stand   Toilet Transfer: Min guard;Ambulation;RW   Toileting- Clothing Manipulation and Hygiene: Minimal assistance;Sit to/from stand       Functional mobility during ADLs: Min guard;Rolling walker       Vision Baseline Vision/History: Wears glasses Wears Glasses: At all times Patient Visual Report: No change from baseline       Perception     Praxis      Pertinent Vitals/Pain Pain Assessment: Faces Faces Pain Scale: Hurts little more Pain Location: neck Pain Descriptors / Indicators: Sore;Discomfort Pain Intervention(s): Repositioned;Patient requesting pain meds-RN notified;Monitored during session     Hand Dominance Right   Extremity/Trunk Assessment Upper Extremity Assessment Upper Extremity Assessment: Generalized weakness   Lower Extremity Assessment Lower Extremity Assessment: Defer to PT evaluation   Cervical / Trunk Assessment Cervical / Trunk Assessment: Other exceptions (C6 fx)   Communication Communication Communication: No difficulties   Cognition Arousal/Alertness: Awake/alert Behavior During Therapy: Anxious Overall Cognitive Status: No family/caregiver present to determine baseline cognitive  functioning                                 General Comments: pt oriented, following commands, making her  needs known   General Comments       Exercises     Shoulder Instructions      Home Living Family/patient expects to be discharged to:: Private residence Living Arrangements: Spouse/significant other Available Help at Discharge: Family;Available 24 hours/day Type of Home: House                       Home Equipment: Walker - 2 wheels   Additional Comments: questionable ability for husband to support pt at home      Prior Functioning/Environment Level of Independence: Needs assistance  Gait / Transfers Assistance Needed: was ambulatory with no AD ADL's / Homemaking Assistance Needed: husband did not provide assistance at home            OT Problem List: Decreased activity tolerance;Impaired balance (sitting and/or standing);Decreased strength;Decreased knowledge of use of DME or AE;Pain;Decreased knowledge of precautions      OT Treatment/Interventions: Self-care/ADL training;DME and/or AE instruction;Therapeutic activities;Patient/family education;Balance training    OT Goals(Current goals can be found in the care plan section) Acute Rehab OT Goals Patient Stated Goal: none stated OT Goal Formulation: With patient Time For Goal Achievement: 11/21/20 Potential to Achieve Goals: Good ADL Goals Pt Will Perform Grooming: with supervision;standing Pt Will Perform Lower Body Bathing: with supervision;sit to/from stand Pt Will Perform Lower Body Dressing: with supervision;sit to/from stand Pt Will Transfer to Toilet: with supervision;ambulating;bedside commode Pt Will Perform Toileting - Clothing Manipulation and hygiene: with supervision;sit to/from stand Additional ADL Goal #1: Pt will adhere to cervical precautions during ADL and mobility.  OT Frequency: Min 2X/week   Barriers to D/C:            Co-evaluation              AM-PAC OT "6 Clicks" Daily Activity     Outcome Measure Help from another person eating meals?: None Help from another person  taking care of personal grooming?: A Little Help from another person toileting, which includes using toliet, bedpan, or urinal?: A Little Help from another person bathing (including washing, rinsing, drying)?: A Little Help from another person to put on and taking off regular upper body clothing?: A Little Help from another person to put on and taking off regular lower body clothing?: A Little 6 Click Score: 19   End of Session Equipment Utilized During Treatment: Rolling walker;Gait belt;Cervical collar  Activity Tolerance: Patient tolerated treatment well Patient left: in bed;with call bell/phone within reach;with bed alarm set  OT Visit Diagnosis: Unsteadiness on feet (R26.81);Other abnormalities of gait and mobility (R26.89);Pain;Muscle weakness (generalized) (M62.81)                Time: 1520-1550 OT Time Calculation (min): 30 min Charges:  OT General Charges $OT Visit: 1 Visit OT Evaluation $OT Eval Moderate Complexity: 1 Mod OT Treatments $Self Care/Home Management : 8-22 mins  Nestor Lewandowsky, OTR/L Acute Rehabilitation Services Pager: [phone] Office: [phone]  Malka So 11/07/2020, 4:39 PM

## 2020-11-07 NOTE — Hospital Course (Addendum)
Denise Serrano is a 65 year old person with a pmhx of Bipolar I Disorder s/p ECT on 7/25, CKD G4, diet controlled T2DM, HTN, HLD, and hypothyroidism h/w a nondisplaced C6 facet and anterior osteophyte fracture after a fall on 8/16.   Non-displaced fracture of sub-axial cervical spine from a fall Pt with a fall from bed on 8/16, brought to [facility] via EMS and found to have a nondisplaced C6 facet and anterior osteophyte fracture on CT. Neurosurgery recommends Aspen collar and outpatient follow up in 2 weeks. She had an elevated CK at 4636->5152->4835->4029 which was concerning for a seizure. EEG on 8/17 showed nonspecific findings of mild diffuse encephalopathy, but unable to rule out isolated seizure. Psych consulted on 8/17 for concern of polypharmacy however they did not recommend further adjustment of her medications at the time. Of note, patient did have DEXA scan in 2017 showing osteoporosis (T score < -2.5 at spine and hip) so this may be fragility fracture. She will have home health PT at discharge to help her regain strength and mobility.   Acute on CKD G4 2/2 Rhabdomyolysis  Pt presented with an elevated creatinine and BUN. UA with myoglobinuria and CK elevated at 5,152, suggestive of rhabdo. No electrolyte derangements. She received IV fluids, and renal function was monitored.   Recent COVID Infection Tested positive during admission on 8/16. Remained asymptomatic during hospitalization.  Diet controlled with T2DM Last A1c in 2016 at 7.0. Most recent A1c on 8/17 at 6. Not on home medications. She received SSI during hospitalizations.   Essential HTN She was mostly normotensive during her hospitalization. She did not get any amlodipine as she does not take it at home.   HLD On home atorvastatin with last LDL of 34 and HDL 29 in 2016. Recent lipid panel on 8/17 with LDL of 72 and HDL of 35. She initially received her home Atorvastatin 20 mg daily but it was held on 8/17 in setting of her elevated  CK, concerning for rhabdomyolysis.   Hypothyroidism Last TSH normal at 3.257 and T4 at 0.88. Recently on 8/17, TSH wnl at 1.245. Resumed home levothyroxine 50 mcg daily.   Bipolar I disorder s/p ECT ECT on August 8th. Psych consulted and they recommend continuing home medications while inpatient. So patient was resumed on home Cariprazine 3 mg daily and Lumateperone tosylate 42 mg daily.  Mood disorder (depression/anxiety) Pt has a history of anxiety and depression s/p ECT recently. On home Alprazolam 0.5 mg tid, Bupropion 300 mg daily and Gabapentin 300 mg daily. We continued all home meds.

## 2020-11-07 NOTE — Progress Notes (Signed)
RE: Denise Serrano Date of Birth: 30-May-2055 Date: 11/07/20  Please be advised that the above-named patient will require a short-term nursing home stay - anticipated 30 days or less for rehabilitation and strengthening.  The plan is for return home.

## 2020-11-07 NOTE — Plan of Care (Signed)

## 2020-11-07 NOTE — Progress Notes (Signed)
Initial Nutrition Assessment  DOCUMENTATION CODES:   Obesity unspecified  INTERVENTION:   -Ensure Enlive po TID, each supplement provides 350 kcal and 20 grams of protein  -MVI with minerals daily  NUTRITION DIAGNOSIS:   Inadequate oral intake related to decreased appetite as evidenced by per patient/family report.  GOAL:   Patient will meet greater than or equal to 90% of their needs  MONITOR:   PO intake, Supplement acceptance, Labs, Weight trends, Skin, I & O's  REASON FOR ASSESSMENT:   Malnutrition Screening Tool    ASSESSMENT:   Denise Serrano is a 65 year old person with a pmhx of Bipolar I Disorder s/p ECT on 7/25, CKD G4, diet controlled T2DM, HTN, HLD, and hypothyroidism h/w AMS after a recent fall.  Pt admitted with C6 cervical fracture s/p fall   Reviewed I/O's: -250 ml x 24 hours and +1.7 L since admission  UOP: 1.3 L x 24 hours  Spoke with pt at bedside, who was drowsy and confused at time of visit. Observed lunch tray- she consumed 100% of fruit cup. When encouraged pt to eat more off her meal tray, she replied "No, I won't eat that. I don't want that". Per H&P, pt with decreased oral intake for several days PTA secondary to COVID-19.   Reviewed wt hx. Pt has experienced a 4% wt loss over the past month, which is not significant for time frame.   Pt with poor oral intake and would benefit from nutrient dense supplement. One Ensure Enlive supplement provides 350 kcals, 20 grams protein, and 44-45 grams of carbohydrate vs one Glucerna shake supplement, which provides 220 kcals, 10 grams of protein, and 26 grams of carbohydrate. Given pt's hx of DM, RD will reassess adequacy of PO intake, CBGS, and adjust supplement regimen as appropriate at follow-up.    Lab Results  Component Value Date   HGBA1C 6.0 (H) 11/06/2020   PTA DM medications are none.   Labs reviewed: CBGS: 91-110 (inpatient orders for glycemic control are 0-15 units insulin aspart TID with  meals).    NUTRITION - FOCUSED PHYSICAL EXAM:  Flowsheet Row Most Recent Value  Orbital Region No depletion  Upper Arm Region No depletion  Thoracic and Lumbar Region No depletion  Buccal Region No depletion  Temple Region No depletion  Clavicle Bone Region No depletion  Clavicle and Acromion Bone Region No depletion  Scapular Bone Region No depletion  Dorsal Hand No depletion  Patellar Region No depletion  Anterior Thigh Region No depletion  Posterior Calf Region No depletion  Edema (RD Assessment) Mild  Hair Reviewed  Eyes Reviewed  Mouth Reviewed  Skin Reviewed  Nails Reviewed       Diet Order:   Diet Order             Diet heart healthy/carb modified Room service appropriate? No; Fluid consistency: Thin  Diet effective now                   EDUCATION NEEDS:   Not appropriate for education at this time  Skin:  Skin Assessment: Reviewed RN Assessment  Last BM:  11/07/20  Height:   Ht Readings from Last 1 Encounters:  11/05/20 5' (1.524 m)    Weight:   Wt Readings from Last 1 Encounters:  11/05/20 72.6 kg    Ideal Body Weight:  45.5 kg  BMI:  Body mass index is 31.25 kg/m.  Estimated Nutritional Needs:   Kcal:  1600-1800  Protein:  90-105 grams  Fluid:  > 1.6 L    [name], RD, LDN, [place] Registered Dietitian II Certified Diabetes Care and Education Specialist Please refer to [facility] for RD and/or RD on-call/weekend/after hours pager

## 2020-11-07 NOTE — NC FL2 (Signed)
[place] LEVEL OF CARE SCREENING TOOL     IDENTIFICATION  Patient Name: Denise Serrano Birthdate: [birth year] Sex: female Admission Date (Current Location): 11/05/2020  [facility] and [place] Number:  Herbalist and Address:  [facility] [place]. [facility], [place] [address] [place], [place] [address]      Provider Number: [id number]  Attending Physician Name and Address:  Sid Falcon, MD  Relative Name and Phone Number:       Current Level of Care: Hospital Recommended Level of Care: [place] Prior Approval Number:    Date Approved/Denied:   PASRR Number: pending  Discharge Plan: SNF    Current Diagnoses: Patient Active Problem List   Diagnosis Date Noted   CKD (chronic kidney disease) stage 4, GFR 15-29 ml/min ([place]) 11/05/2020   C6 cervical fracture ([place]) 11/05/2020   Accidental fall from bed 11/05/2020   Anemia, iron deficiency 0000000   Metabolic acidosis 123456   Anemia of renal disease 07/01/2014   Acute renal failure ([place]) 06/28/2014   Asthma exacerbation 06/28/2014   Dizziness 06/28/2014   GERD (gastroesophageal reflux disease) 06/28/2014   AKI (acute kidney injury) ([place]) 05/03/2014   Bipolar disorder ([place]) 05/03/2014   Essential hypertension 05/03/2014   Diarrhea of presumed infectious origin 05/03/2014   Hypothyroidism 05/03/2014   Hyperlipidemia 05/03/2014   Gait difficulty 11/15/2013   Type 2 diabetes mellitus with diabetic chronic kidney disease ([place]) 11/14/2013   Diabetic polyneuropathy associated with type 2 diabetes mellitus ([place]) 11/14/2013   Ingrown left big toenail 10/22/2010    Orientation RESPIRATION BLADDER Height & Weight     Self, Time, Situation, Place  Normal Continent Weight: 160 lb (72.6 kg) Height:  5' (152.4 cm)  BEHAVIORAL SYMPTOMS/MOOD NEUROLOGICAL BOWEL NUTRITION STATUS      Continent Diet (see dc summary)  AMBULATORY STATUS COMMUNICATION OF NEEDS Skin   Limited to  Maximum Assist Verbally Normal                       Personal Care Assistance Level of Assistance  Bathing, Feeding, Dressing Bathing Assistance: Limited assistance Feeding assistance: Limited assistance Dressing Assistance: Limited assistance     Functional Limitations Info  Sight Sight Info: Impaired        SPECIAL CARE FACTORS FREQUENCY  PT (By licensed PT), OT (By licensed OT)     PT Frequency: 5x/week OT Frequency: 5x/week            Contractures Contractures Info: Not present    Additional Factors Info  Code Status, Allergies, Isolation Precautions, Psychotropic, Insulin Sliding Scale Code Status Info: Full Allergies Info: Amoxicillin, Iodine, Penicillin G Psychotropic Info: Wellbutrin Insulin Sliding Scale Info: Insulin Isolation Precautions Info: COVID + 8/16     Current Medications (11/07/2020):  This is the current hospital active medication list Current Facility-Administered Medications  Medication Dose Route Frequency Provider Last Rate Last Admin   albuterol (PROVENTIL) (2.5 MG/3ML) 0.083% nebulizer solution 2.5 mg  2.5 mg Nebulization Q4H PRN Aslam, Sadia, MD       ALPRAZolam Duanne Moron) tablet 0.5 mg  0.5 mg Oral QHS PRN Aslam, Sadia, MD   0.5 mg at 11/06/20 2125   amLODipine (NORVASC) tablet 5 mg  5 mg Oral Daily Aslam, Loralyn Freshwater, MD   5 mg at 11/07/20 0844   aspirin EC tablet 81 mg  81 mg Oral Daily Aslam, Loralyn Freshwater, MD   81 mg at 11/07/20 0843   buPROPion (WELLBUTRIN XL) 24 hr  tablet 300 mg  300 mg Oral Daily Aslam, Sadia, MD   300 mg at 11/07/20 0844   cariprazine (VRAYLAR) capsule 3 mg  3 mg Oral Daily Aslam, Sadia, MD   3 mg at 11/07/20 0843   carvedilol (COREG) tablet 12.5 mg  12.5 mg Oral BID WC Aslam, Sadia, MD   12.5 mg at 11/07/20 1636   deutetrabenzine (Austedo) tablet 24 mg per 2 tablets - patient's own supply  24 mg Oral BID Gilles Chiquito B, MD   24 mg at 11/07/20 0843   enoxaparin (LOVENOX) injection 30 mg  30 mg Subcutaneous QHS Aslam, Sadia, MD    30 mg at 11/06/20 2125   feeding supplement (ENSURE ENLIVE / ENSURE PLUS) liquid 237 mL  237 mL Oral TID BM Gilles Chiquito B, MD       fluticasone furoate-vilanterol (BREO ELLIPTA) 100-25 MCG/INH 1 puff  1 puff Inhalation Daily Aslam, Sadia, MD   1 puff at 11/06/20 1038   gabapentin (NEURONTIN) capsule 300 mg  300 mg Oral Daily Aslam, Sadia, MD   300 mg at 11/07/20 0844   HYDROmorphone (DILAUDID) injection 0.5 mg  0.5 mg Intravenous Q4H PRN Aslam, Loralyn Freshwater, MD   0.5 mg at 11/07/20 1637   insulin aspart (novoLOG) injection 0-15 Units  0-15 Units Subcutaneous TID WC Harvie Heck, MD   2 Units at 11/07/20 1637   levothyroxine (SYNTHROID) tablet 50 mcg  50 mcg Oral Daily Harvie Heck, MD   50 mcg at 11/07/20 0550   multivitamin with minerals tablet 1 tablet  1 tablet Oral Daily Gilles Chiquito B, MD   1 tablet at 11/07/20 1637   sodium chloride flush (NS) 0.9 % injection 3 mL  3 mL Intravenous Q12H Harvie Heck, MD   3 mL at 11/07/20 0845     Discharge Medications: Please see discharge summary for a list of discharge medications.  Relevant Imaging Results:  Relevant Lab Results:   Additional Information SSN: Boyce [place], [place]

## 2020-11-08 DIAGNOSIS — U071 COVID-19: Secondary | ICD-10-CM

## 2020-11-08 DIAGNOSIS — F3175 Bipolar disorder, in partial remission, most recent episode depressed: Secondary | ICD-10-CM

## 2020-11-08 DIAGNOSIS — N184 Chronic kidney disease, stage 4 (severe): Secondary | ICD-10-CM

## 2020-11-08 LAB — COMPREHENSIVE METABOLIC PANEL
ALT: 36 U/L (ref 0–44)
AST: 73 U/L — ABNORMAL HIGH (ref 15–41)
Albumin: 2.6 g/dL — ABNORMAL LOW (ref 3.5–5.0)
Alkaline Phosphatase: 49 U/L (ref 38–126)
Anion gap: 8 (ref 5–15)
BUN: 18 mg/dL (ref 8–23)
CO2: 25 mmol/L (ref 22–32)
Calcium: 8.9 mg/dL (ref 8.9–10.3)
Chloride: 109 mmol/L (ref 98–111)
Creatinine, Ser: 1.87 mg/dL — ABNORMAL HIGH (ref 0.44–1.00)
GFR, Estimated: 29 mL/min — ABNORMAL LOW (ref >60)
Glucose, Bld: 98 mg/dL (ref 70–99)
Potassium: 3.9 mmol/L (ref 3.5–5.1)
Sodium: 142 mmol/L (ref 135–145)
Total Bilirubin: 0.8 mg/dL (ref 0.3–1.2)
Total Protein: 5.3 g/dL — ABNORMAL LOW (ref 6.5–8.1)

## 2020-11-08 LAB — GLUCOSE, CAPILLARY
Glucose-Capillary: 208 mg/dL — ABNORMAL HIGH (ref 70–99)
Glucose-Capillary: 66 mg/dL — ABNORMAL LOW (ref 70–99)
Glucose-Capillary: 127 mg/dL — ABNORMAL HIGH (ref 70–99)
Glucose-Capillary: 123 mg/dL — ABNORMAL HIGH (ref 70–99)
Glucose-Capillary: 115 mg/dL — ABNORMAL HIGH (ref 70–99)

## 2020-11-08 LAB — CULTURE, BLOOD (ROUTINE X 2): Special Requests: ADEQUATE

## 2020-11-08 LAB — CK: Total CK: 2598 U/L — ABNORMAL HIGH (ref 38–234)

## 2020-11-08 MED ORDER — INSULIN ASPART 100 UNIT/ML IJ SOLN
0.0000 [IU] | Freq: Three times a day (TID) | INTRAMUSCULAR | Status: DC
  Administered 2020-11-10 – 2020-11-12 (×5): 1 [IU] via SUBCUTANEOUS

## 2020-11-08 NOTE — Progress Notes (Addendum)
Subjective: No acute overnight events. Patient was seen at bedside during rounds this morning. Pt reports feeling well this AM. Pt denies confusion and SOB, and states that she is not in any noticeable pain. Pt has been working with PT. No other complains or concerns at this time. She is looking forward to going to a SNF on Friday.   Objective:  Vital signs in last 24 hours: Vitals:   11/08/20 0336 11/08/20 0427 11/08/20 0808 11/08/20 1218  BP: (!) 99/45  114/85 (!) 94/47  Pulse: 60  65 (!) 58  Resp: '16  18 18  '$ Temp: 98 F (36.7 C)  97.6 F (36.4 C) 98.7 F (37.1 C)  TempSrc: Oral  Oral Oral  SpO2: 92%  92%   Weight:  75 kg    Height:       Constitutional: alert, well-appearing, in no acute distress HENT: normocephalic, atraumatic, mucous membranes moist, wearing c collar Eyes: conjunctiva non-erythematous, extraocular movements intact Neck: supple Cardiovascular: regular rate and rhythm, no m/r/g Pulmonary/Chest: normal work of breathing on room air, lungs clear to auscultation bilaterally Abdominal: soft, non-tender to palpation, non-distended Neurological: alert & oriented x 3  Skin: warm and dry Psych: normal behavior, normal affect   Assessment/Plan:  Principal Problem:   C6 cervical fracture (HCC) Active Problems:   Type 2 diabetes mellitus with diabetic chronic kidney disease (HCC)   AKI (acute kidney injury) (HCC)   Bipolar disorder (HCC)   Essential hypertension   Hypothyroidism   Hyperlipidemia   Asthma exacerbation   CKD (chronic kidney disease) stage 4, GFR 15-29 ml/min (HCC)   Accidental fall from bed  Denise Serrano is a 65 year old person with a pmhx of Bipolar I Disorder s/p ECT on 7/25, CKD G4, diet controlled T2DM, HTN, HLD, and hypothyroidism admitted for AKI and AMS 2/2 a fall.   Non-displaced fracture of sub-axial cervical spine  Fall Polypharmacy  Pt with nondisplaced C6 facet and anterior osteophyte fracture. Neurosurg recommends Aspen  collar and outpatient follow up in 2 weeks. Ddx includes seizure given initial elevating CK at presentation, EEG showed nonspecific findings of mild diffuse encephalopathy; unable to rule out isolated seizure. Pt's 2017 DEXA scan consistent with osteoporosis, could be possible fragility fracture. Per psych, no medication adjustment required right now. Continue to monitor,  - dilaudid 0.5 q4 prn for severe pain, has been requiring about 2x/day.  - PT/OT recommending SNF: TOC consulted, placement likely after COVID isolation period on 8/26.  - Outpatient: f/u with neurosurg in 2 wks and can consider bisphosphonate therapy and Vit D therapy.    Acute on CKD G4  Rhabdomyolysis Resolved, Cr admission 3.03 -> 1.87 on 8/19 (at baseline). BUN initially 34 -> 18 on 8/19. UA with myoglobinuria and CK down trending. She is s/p 3L LR bolus and on maintenance LR fluids.  - continue avoiding nephrotoxic agents    Recent COVID Infection Tested positive during admission on 8/16. No URI symptoms, including cough, fever, SOB, and malaise.    Diet controlled with T2DM Last A1c in 2016 at 7.0. Recent A1c of 6. Not on home medications. Blood sugar range 115-130s.  - SSI   Essential HTN SBP ranging in 109-148 mmHg. Not taking home amlodipine.  - Held Coreg due to soft BP (90/40s) and mild asymptomatic bradycardia yesterday (50s), will consider restarting today.    HLD Recent lipid panel with LDL of 72 and HDL of 35. Atorvastatin 20 mg qd held in setting of elevated CK, concerning for  rhabdomyolysis.  -Will resume home atorvastatin at discharge.    Hypothyroidism TSH wnl 3.3 and T4 at 0.88. Recent TSH wnl at 1.245. - Continue home Levothyroxine 50 mcg daily   Bipolar I disorder s/p ECT ECT on 10/16/2020. Psych consulted, recommend continuing home medications.  - continue Cariprazine 3 mg qd and Lumateperone tosylate 42 mg qd    Mood disorder (depression/anxiety) On home Alprazolam 0.5 mg tid, Bupropion 300  mg daily and Gabapentin 300 mg daily.  - continue home gabapentin and bupropion    Best Practice: Diet: HH IVF: none VTE: Enoxaparin 30 Code: Full  Signature: Denise Manes, MD  Internal Medicine Resident, PGY-1 [facility]  Pager: [phone] 11/09/20 8:19 AM After 5pm on weekdays and 1pm on weekends: On Call pager [phone]

## 2020-11-08 NOTE — Progress Notes (Signed)
Physical Therapy Treatment Patient Details Name: Dylanie Bandemer MRN: [id number] DOB: 11-19-1955 Today's Date: 11/08/2020    History of Present Illness 65 yo female with onset of fall OOB to floor and subsequent trip to hosp was found to have C6 fracture, nondisplaced and AMS, AKI.  Has significant other who cannot care for her and is asking about LTC.  PMHx:  recent Covid, CKD4, bipolar disorder, DM, HTN, HLD, hypothyroidism, CPAP, asthma, AKI, ECT,    PT Comments    Pt progressing well with PT, ambulating this session for household distances. Pt continues to requrie some physical assistance to manage bed mobility. Pt also demonstrates impaired problem solving abilities, needing PT assistance to move objects out of path as pt is unable to mobilize around them effectively otherwise. Pt will benefit from continued acute PT POC to improve gait quality and reduce falls risk. PT contacts significant other, [name], who states he feels he is unable to assist the patient effectively as she often refuses assistance when at home, resulting in multiple previous falls. PT continues to recommend SNF placement to improve balance.  Follow Up Recommendations  SNF (may need to consider ALF long term)     Equipment Recommendations  None recommended by PT    Recommendations for Other Services       Precautions / Restrictions Precautions Precautions: Fall Required Braces or Orthoses: Cervical Brace Cervical Brace: Hard collar;At all times Restrictions Weight Bearing Restrictions: No    Mobility  Bed Mobility Overal bed mobility: Needs Assistance Bed Mobility: Supine to Sit;Sit to Supine;Rolling Rolling: Supervision   Supine to sit: Min assist Sit to supine: Min assist   General bed mobility comments: assistance for UE to pull into sitting and for LEs when returning to supine    Transfers Overall transfer level: Needs assistance Equipment used: Rolling walker (2 wheeled) Transfers:  Sit to/from Stand Sit to Stand: Supervision            Ambulation/Gait Ambulation/Gait assistance: Min guard Gait Distance (Feet): 40 Feet (additional trial of 25') Assistive device: Rolling walker (2 wheeled) Gait Pattern/deviations: Step-through pattern Gait velocity: reduced Gait velocity interpretation: <1.8 ft/sec, indicate of risk for recurrent falls General Gait Details: pt with slowed step-through gait, assistance required to move obstacles from path with difficulty problem solving alternative methods to negotiate   Stairs             Wheelchair Mobility    Modified Rankin (Stroke Patients Only)       Balance Overall balance assessment: History of Falls;Needs assistance Sitting-balance support: No upper extremity supported;Feet supported Sitting balance-Leahy Scale: Good     Standing balance support: Single extremity supported;Bilateral upper extremity supported Standing balance-Leahy Scale: Poor Standing balance comment: reliant on at least unilateral UE support of RW                            Cognition Arousal/Alertness: Awake/alert Behavior During Therapy: WFL for tasks assessed/performed Overall Cognitive Status: No family/caregiver present to determine baseline cognitive functioning                                 General Comments: pt follows commands well when she is able to hear therapist adequately. Pt has deficits in problem solving, with difficulty navigating around obstacles if unable to continue in a straight path with walker      Exercises  General Comments General comments (skin integrity, edema, etc.): VSS on RA      Pertinent Vitals/Pain Pain Assessment: No/denies pain    Home Living                      Prior Function            PT Goals (current goals can now be found in the care plan section) Acute Rehab PT Goals Patient Stated Goal: none stated Progress towards PT goals:  Progressing toward goals    Frequency    Min 2X/week      PT Plan Current plan remains appropriate    Co-evaluation              AM-PAC PT "6 Clicks" Mobility   Outcome Measure  Help needed turning from your back to your side while in a flat bed without using bedrails?: A Little Help needed moving from lying on your back to sitting on the side of a flat bed without using bedrails?: A Little Help needed moving to and from a bed to a chair (including a wheelchair)?: A Little Help needed standing up from a chair using your arms (e.g., wheelchair or bedside chair)?: A Little Help needed to walk in hospital room?: A Little Help needed climbing 3-5 steps with a railing? : A Lot 6 Click Score: 17    End of Session Equipment Utilized During Treatment: Cervical collar Activity Tolerance: Patient tolerated treatment well Patient left: in bed;with call bell/phone within reach;with bed alarm set Nurse Communication: Mobility status PT Visit Diagnosis: Muscle weakness (generalized) (M62.81);Other abnormalities of gait and mobility (R26.89);Pain     Time: [id number] PT Time Calculation (min) (ACUTE ONLY): 32 min  Charges:  $Gait Training: 8-22 mins                     Zenaida Niece, PT, DPT Acute Rehabilitation Pager: [phone]    Zenaida Niece 11/08/2020, 10:51 AM

## 2020-11-08 NOTE — Plan of Care (Signed)

## 2020-11-08 NOTE — TOC CAGE-AID Note (Signed)
Transition of Care [facility]) - CAGE-AID Screening   Patient Details  Name: Denise Serrano MRN: [id number] Date of Birth: [birth year]  [facility]) CM/SW Contact:    Gaetano Hawthorne Tarpley-Carter, [place] Phone Number: 11/08/2020, 2:18 PM   Clinical Narrative: Pt participated in [name].  Pt stated she does not use substance or ETOH.  Pt was not offered resources, due to no usage of substance or ETOH.     Eyla Tallon Tarpley-Carter, MSW, LCSW-A Pronouns:  She/Her/Hers [facility]itions of [facility] Clinical Social Worker Direct Number:  [phone] [email or web address]   CAGE-AID Screening:    Have You Ever Felt You Ought to Cut Down on Your Drinking or Drug Use?: No Have People Annoyed You By [employer] Your Drinking Or Drug Use?: No Have You Felt Bad Or Guilty About Your Drinking Or Drug Use?: No Have You Ever Had a Drink or Used Drugs First Thing In The Morning to Steady Your Nerves or to Get Rid of a Hangover?: No CAGE-AID Score: 0  Substance Abuse Education Offered: No

## 2020-11-08 NOTE — Progress Notes (Signed)
Inpatient Diabetes Program Recommendations  AACE/ADA: New Consensus Statement on Inpatient Glycemic Control  Target Ranges:  Prepandial:   less than 140 mg/dL      Peak postprandial:   less than 180 mg/dL (1-2 hours)      Critically ill patients:  140 - 180 mg/dL   Results for OSMARY, SCHLEIFER "SHERRY" (MRN [id number]) as of 11/08/2020 13:31  Ref. Range 11/07/2020 07:36 11/07/2020 12:18 11/07/2020 16:25 11/07/2020 20:45 11/08/2020 08:10 11/08/2020 12:24  Glucose-Capillary Latest Ref Range: 70 - 99 mg/dL 100 (H) 90 136 (H) 90 208 (H)  Novolog 5 units 66 (L)  Results for ANAISA, RIESTERER "SHERRY" (MRN [id number]) as of 11/08/2020 13:31  Ref. Range 11/06/2020 02:50  Hemoglobin A1C Latest Ref Range: 4.8 - 5.6 % 6.0 (H)    Review of Glycemic Control  Diabetes history: DM2 Outpatient Diabetes medications: None; diet controlled Current orders for Inpatient glycemic control: Novolog 0-15 units TID with meals  Inpatient Diabetes Program Recommendations:    Insulin: Please consider decreasing Novolog to sensitive correction 0-9 units TID.  Thanks, Barnie Alderman, RN, MSN, CDE Diabetes Coordinator Inpatient Diabetes Program [phone] (Team Pager from 8am to 5pm)

## 2020-11-08 NOTE — Progress Notes (Addendum)
Subjective: Patient reporting that she feels "so-so" this am. No acute over night events. She was able to get up and walk around in her room this morning. She is also working well with PT. She was updated on d/c plan to SNF she is agreeable.   Objective: Physical exam: General: Well-appearing person lying in bed HEENT: Wearing c-collar.  CV: RRR with no murmurs Extremities: No pitting edema. Neuro: Alert and oriented. Moving all limbs spontaneously.   Vital signs in last 24 hours: Vitals:   11/08/20 0305 11/08/20 0336 11/08/20 0427 11/08/20 0808  BP: (!) 141/69 (!) 99/45  114/85  Pulse:  60  65  Resp:  16  18  Temp:  98 F (36.7 C)  97.6 F (36.4 C)  TempSrc:  Oral  Oral  SpO2:  92%  92%  Weight:   75 kg   Height:       Weight change:   Intake/Output Summary (Last 24 hours) at 11/08/2020 0838 Last data filed at 11/08/2020 0305 Gross per 24 hour  Intake --  Output 350 ml  Net -350 ml   CBC Latest Ref Rng & Units 11/07/2020 11/06/2020 11/05/2020  WBC 4.0 - 10.5 K/uL 9.2 10.6(H) 11.6(H)  Hemoglobin 12.0 - 15.0 g/dL 13.6 13.3 14.8  Hematocrit 36.0 - 46.0 % 39.4 40.3 44.8  Platelets 150 - 400 K/uL 157 148(L) 167   CMP Latest Ref Rng & Units 11/08/2020 11/07/2020 11/06/2020  Glucose 70 - 99 mg/dL 98 96 102(H)  BUN 8 - 23 mg/dL 18 24(H) 30(H)  Creatinine 0.44 - 1.00 mg/dL 1.87(H) 1.82(H) 2.35(H)  Sodium 135 - 145 mmol/L 142 140 139  Potassium 3.5 - 5.1 mmol/L 3.9 3.8 3.5  Chloride 98 - 111 mmol/L 109 107 104  CO2 22 - 32 mmol/L '25 27 25  '$ Calcium 8.9 - 10.3 mg/dL 8.9 8.8(L) 9.3  Total Protein 6.5 - 8.1 g/dL 5.3(L) 5.4(L) 6.1(L)  Total Bilirubin 0.3 - 1.2 mg/dL 0.8 0.8 1.0  Alkaline Phos 38 - 126 U/L 49 54 62  AST 15 - 41 U/L 73(H) 88(H) 100(H)  ALT 0 - 44 U/L 36 36 35    Component     Latest Ref Rng & Units 11/06/2020 11/07/2020 11/08/2020         8:38 PM    CK Total     38 - 234 U/L 4,835 (H) 4,029 (H) 2,598 (H)   BCX: GPC- growing Staph epi likely contaminant   EEG  8/17: This study is suggestive of mild diffuse encephalopathy, nonspecific etiology. No seizures or epileptiform discharges were seen throughout the recording.  Assessment/Plan:  Principal Problem:   C6 cervical fracture (HCC) Active Problems:   Type 2 diabetes mellitus with diabetic chronic kidney disease ([place])   AKI (acute kidney injury) ([place])   Bipolar disorder ([place])   Essential hypertension   Hypothyroidism   Hyperlipidemia   Asthma exacerbation   CKD (chronic kidney disease) stage 4, GFR 15-29 ml/min (HCC)   Accidental fall from bed   Non-displaced fracture of sub-axial cervical spine  Fall Polypharmacy  Pt with recent fall from bed, found to have a nondisplaced C6 facet and anterior osteophyte fracture on CT. Neurosurgery recommends Aspen collar and outpatient follow up in 2 weeks. We were concerned for seizure given her rising CK however EEG on 8/17 showed nonspecific findings of mild diffuse encephalopathy, but unable to rule out isolated seizure. Psych consulted on 8/17 for concern of polypharmacy however they do not recommend further adjustment  of her medications at the time. Of note, patient does have DEXA scan in 2017 showing osteoporosis (T score < -2.5 at spine and hip) so this may be fragility fracture. Working on SNF placement for improved strength and balance. Further, can consider treatment with Vitamin D and bisphosphate therapy in the outpatient setting.  - pain: dilaudid 0.5 q4 prn for severe pain - PT/OT recommending SNF: TOC consulted awaiting recs  - Outpatient: f/u with neurosurg in 2 wks and can consider bisphosphonate therapy   Acute on CKD G4  Rhabdomyolysis  Pt initially with an elevated creatinine of 3.03 and today at 1.87 which is about her baseline. BUN initially at 34 and today at 18. UA with myoglobinuria and CK down trending today 2598. No electrolyte derangement at the moment. She is s/p 3L LR bolus and on maintenance LR fluids.  - continue to avoid  nephrotoxic agents   Recent COVID Infection Tested positive during admission on 8/16. No URI symptoms.    Diet controlled with T2DM Last A1c in 2016 at 7.0. Recent A1c of 6. Not on home medications. - SSI   Essential HTN SBP ranging in 109-148 mmHg. Not taking home amlodipine.    HLD On home atorvastatin with last LDL of 34 and HDL 29 in 2016. Recent lipid panel with LDL of 72 and HDL of 35. Holding home Atorvastatin 20 mg daily in setting of her elevated CK, concerning for rhabdomyolysis. Will resume home statin at discharge.    Hypothyroidism Last TSH normal at 3.257 and T4 at 0.88. Recent TSH wnl at 1.245. Will resume home levothyroxine.  - Continue Levothyroxine 50 mcg daily   Bipolar I disorder s/p ECT ECT on 10/16/2020. Psych consulted and they recommend continuing home medications while inpatient.  - continue Cariprazine 3 mg daily and Lumateperone tosylate 42 mg daily    Mood disorder (depression/anxiety) On home Alprazolam 0.5 mg tid, Bupropion 300 mg daily and Gabapentin 300 mg daily.  - continue home gabapentin and bupropion    Diet: HH VTE: Enoxaparin  Code: full   Prior to Admission Living Arrangement: Home Anticipated Discharge Location: SNF Barriers to Discharge: Medical improvement and COVID quarantine   Dispo: Admit patient to Observation with expected stay likely until the 26th.     LOS: 3 days   Gilford Silvius T, Medical Student 11/08/2020, 8:38 AM  Attestation for Student Documentation:  I personally was present and performed or re-performed the history, physical exam and medical decision-making activities of this service and have verified that the service and findings are accurately documented in the student's note.  Virl Axe, MD 11/08/2020, 3:46 PM

## 2020-11-09 LAB — GLUCOSE, CAPILLARY
Glucose-Capillary: 115 mg/dL — ABNORMAL HIGH (ref 70–99)
Glucose-Capillary: 111 mg/dL — ABNORMAL HIGH (ref 70–99)
Glucose-Capillary: 102 mg/dL — ABNORMAL HIGH (ref 70–99)
Glucose-Capillary: 111 mg/dL — ABNORMAL HIGH (ref 70–99)

## 2020-11-09 MED ORDER — CARVEDILOL 12.5 MG PO TABS
12.5000 mg | ORAL_TABLET | Freq: Two times a day (BID) | ORAL | Status: DC
  Administered 2020-11-09 – 2020-11-12 (×6): 12.5 mg via ORAL
  Filled 2020-11-09 (×6): qty 1

## 2020-11-09 NOTE — TOC Progression Note (Signed)
Transition of Care [facility]) - Progression Note    Patient Details  Name: Denise Serrano MRN: [id number] Date of Birth: 07-04-1955  Transition of Care [facility]) CM/SW [place], LCSW Phone Number: 11/09/2020, 10:23 AM  Clinical Narrative:    CSW requested [place] review referral. [place] is still pending (clinicals uploaded).    Expected Discharge Plan: [place] Barriers to Discharge: SNF Covid, Continued Medical Work up, SNF Pending bed offer  Expected Discharge Plan and Services Expected Discharge Plan: [place] In-house Referral: Clinical Social Work   Post Acute Care Choice: [place] Living arrangements for the past 2 months: Single Family Home                                       Social Determinants of Health (SDOH) Interventions    Readmission Risk Interventions No flowsheet data found.

## 2020-11-09 NOTE — Progress Notes (Signed)
Patient on isolation for COVID. CPAP not placed on patient at this time.

## 2020-11-09 NOTE — Progress Notes (Signed)
Subjective: No acute overnight events. Patient was seen at bedside during rounds this morning. Pt reports feeling "so-so" this AM. Pt denies confusion and SOB, and states that she only has discomfort around neck due to brace. Pt looks forward to working with PT. No other complains or concerns at this time. She is hopeful for a SNF placement on Friday.   Objective:  Vital signs in last 24 hours: Vitals:   11/08/20 2007 11/08/20 2343 11/09/20 0357 11/09/20 0755  BP: (!) 152/82 (!) 149/80 100/73 (!) 164/90  Pulse: 68 67 72 69  Resp: '13 14 16 '$ (!) 97  Temp: 97.7 F (36.5 C) 98.7 F (37.1 C) 98.6 F (37 C) 98.3 F (36.8 C)  TempSrc: Oral Oral Oral Oral  SpO2:  95% 96% 97%  Weight:   74.3 kg   Height:       Constitutional: alert, well-appearing, in no acute distress HENT: normocephalic, atraumatic, mucous membranes moist, wearing c collar Eyes: conjunctiva non-erythematous, extraocular movements intact Neck: supple Cardiovascular: regular rate and rhythm, no m/r/g Pulmonary/Chest: normal work of breathing on room air, lungs clear to auscultation bilaterally Abdominal: soft, non-tender to palpation, non-distended Neurological: alert & oriented x 3  Skin: warm and dry Psych: normal behavior, normal affect   Assessment/Plan:  Principal Problem:   C6 cervical fracture (HCC) Active Problems:   Type 2 diabetes mellitus with diabetic chronic kidney disease (HCC)   AKI (acute kidney injury) (HCC)   Bipolar disorder (HCC)   Essential hypertension   Hypothyroidism   Hyperlipidemia   Asthma exacerbation   CKD (chronic kidney disease) stage 4, GFR 15-29 ml/min (HCC)   Accidental fall from bed   COVID  Denise Serrano is a 65 year old person with a pmhx of Bipolar I Disorder s/p ECT on 7/25, CKD G4, diet controlled T2DM, HTN, HLD, and hypothyroidism admitted for AKI and AMS 2/2 a fall.   Non-displaced fracture of sub-axial cervical spine  Fall Polypharmacy  Pt with nondisplaced C6  facet and anterior osteophyte fracture. Aspen collar with outpatient neurosurgery follow up in 2 weeks. Ddx includes seizure given initial elevating CK at presentation, EEG showed nonspecific findings of mild diffuse encephalopathy; unable to rule out isolated seizure. Pt's 2017 DEXA scan consistent with osteoporosis, could be possible fragility fracture. Per psych, no medication adjustment required right now. Continue to monitor. - dilaudid 0.5 q4 prn for severe pain, has been requiring about once/day.  - PT/OT recommending SNF: TOC consulted, placement likely after COVID isolation period on 8/26.  - Outpatient: f/u with neurosurg in 2 weeks, can consider bisphosphonate therapy and Vit D therapy.    Acute on CKD G4  Rhabdomyolysis Cr admission 3.03 -> 1.87 on 8/19 (back at baseline). BUN initially 34 -> 18 on 8/19. UA with myoglobinuria and CK down trending. No electrolyte derangement at the moment. She is s/p 3L LR bolus and on maintenance LR fluids.  - continue avoiding nephrotoxic agents    Recent COVID Infection Tested positive during admission on 8/16. No URI symptoms, including cough, fever, SOB, and malaise.     Diet controlled with T2DM Last A1c in 2016 at 7.0. Recent A1c of 6. Not on home medications. Blood sugar range 115-130s.  - SSI   Essential HTN SBP ranging in 120-140s mmHg. Not taking home amlodipine.  - resumed home coreg 12.5 mg bid   HLD Recent lipid panel with LDL of 72 and HDL of 35. Atorvastatin 20 mg qd held in setting of elevated CK, concerning for  rhabdomyolysis.  -Will resume home atorvastatin at discharge.    Hypothyroidism TSH wnl 3.3 and T4 at 0.88. Recent TSH wnl at 1.245. - Continue home Levothyroxine 50 mcg daily   Bipolar I disorder s/p ECT ECT on 10/16/2020. Psych consulted, recommend continuing home medications.  - continue Cariprazine 3 mg qd and Lumateperone tosylate 42 mg qd    Mood disorder (depression/anxiety) On home Alprazolam 0.5 mg tid,  Bupropion 300 mg daily and Gabapentin 300 mg daily.  - continue home gabapentin and bupropion    Best Practice: Diet: HH IVF: none VTE: Enoxaparin 30 Code: Full  Signature: Lajean Manes, MD  Internal Medicine Resident, PGY-1 [facility]  Pager: [phone] 11/10/20 10:47 AM After 5pm on weekdays and 1pm on weekends: On Call pager [phone]

## 2020-11-10 LAB — GLUCOSE, CAPILLARY
Glucose-Capillary: 118 mg/dL — ABNORMAL HIGH (ref 70–99)
Glucose-Capillary: 141 mg/dL — ABNORMAL HIGH (ref 70–99)
Glucose-Capillary: 134 mg/dL — ABNORMAL HIGH (ref 70–99)
Glucose-Capillary: 135 mg/dL — ABNORMAL HIGH (ref 70–99)

## 2020-11-10 LAB — CULTURE, BLOOD (ROUTINE X 2)
Culture: NO GROWTH
Special Requests: ADEQUATE

## 2020-11-10 NOTE — Progress Notes (Signed)
Subjective: No acute overnight events. Patient was seen at bedside during rounds this morning. Pt reports feeling "well" this AM. Pt denies confusion and SOB, and states that she only has discomfort around neck due to brace. Pt looks forward to working with PT, and possibly going home depending on PT re-eval. No other complains or concerns at this time.   Objective:  Vital signs in last 24 hours: Vitals:   11/10/20 0000 11/10/20 0500 11/10/20 0535 11/10/20 0746  BP: 137/79  117/65 117/65  Pulse: 71  68 70  Resp: '17  14 19  '$ Temp: 98 F (36.7 C)  97.9 F (36.6 C) 98.1 F (36.7 C)  TempSrc: Oral  Oral Oral  SpO2: 98%  99% 98%  Weight:  70.4 kg    Height:       Constitutional: alert, well-appearing, in no acute distress HENT: normocephalic, atraumatic, mucous membranes moist, wearing c collar Eyes: conjunctiva non-erythematous, extraocular movements intact Neck: supple Cardiovascular: regular rate and rhythm, no m/r/g Pulmonary/Chest: normal work of breathing on room air, lungs clear to auscultation bilaterally Abdominal: soft, non-tender to palpation, non-distended Neurological: alert & oriented x 3  Skin: warm and dry Psych: normal behavior, normal affect   Assessment/Plan:  Principal Problem:   C6 cervical fracture (HCC) Active Problems:   Type 2 diabetes mellitus with diabetic chronic kidney disease (HCC)   AKI (acute kidney injury) (HCC)   Bipolar disorder (HCC)   Essential hypertension   Hypothyroidism   Hyperlipidemia   Asthma exacerbation   CKD (chronic kidney disease) stage 4, GFR 15-29 ml/min (HCC)   Accidental fall from bed   COVID  Denise Serrano is a 65 year old person with a pmhx of Bipolar I Disorder s/p ECT on 7/25, CKD G4, diet controlled T2DM, HTN, HLD, and hypothyroidism admitted for AKI and AMS 2/2 a fall.   Non-displaced fracture of sub-axial cervical spine  Fall Polypharmacy  Pt with nondisplaced C6 facet and anterior osteophyte fracture. Aspen  collar with outpatient neurosurgery follow up in 2 weeks. Ddx includes seizure given initial elevating CK at presentation, EEG showed nonspecific findings of mild diffuse encephalopathy; unable to rule out isolated seizure. Pt's 2017 DEXA scan consistent with osteoporosis, could be possible fragility fracture. Per psych, no medication adjustment required right now. Of note, pt had not been taking Caplyta but has been more interactive today compared to past several days, however will start this home medication. Continue to monitor.  - dilaudid 0.5 q4 prn for severe pain, has been requiring about 1-2x/day.  - PT/OT recommending SNF, unlikely to get one before the end of her quarantine as now only 1 SNF is taking COVID pt's currently. Pt is ambulating much more compared to admission, and is able to do many tasks independently, requested PT to re-eval.  - Outpatient: f/u with neurosurg in 2 weeks, can consider bisphosphonate therapy and Vit D therapy.    Acute on CKD G4  Rhabdomyolysis Cr admission 3.03 -> 1.87 on 8/19 (back at baseline). BUN initially 34 -> 18 on 8/19. UA with myoglobinuria and CK down trended. No electrolyte derangement at the moment.  - continue avoiding nephrotoxic agents    Recent COVID Infection Tested positive during admission on 8/16. No URI symptoms, including cough, fever, SOB, and malaise.     Diet controlled with T2DM Last A1c in 2016 at 7.0. Recent A1c of 6. Not on home medications. Blood sugar range 115-130s.  - SSI   Essential HTN SBP ranging in 120-140s mmHg. Not  taking home amlodipine.  - resumed home coreg 12.5 mg bid   HLD Recent lipid panel with LDL of 72 and HDL of 35. Atorvastatin 20 mg qd held in setting of elevated CK, concerning for rhabdomyolysis.  -Will resume home atorvastatin at discharge.    Hypothyroidism TSH wnl 3.3 and T4 at 0.88. Recent TSH wnl at 1.245. - Continue home Levothyroxine 50 mcg daily   Bipolar I disorder s/p ECT ECT on  10/16/2020. Psych consulted, recommend continuing home medications.  - continue Cariprazine 3 mg qd, and Lumateperone tosylate 42 mg qd (placed order to start).    Mood disorder (depression/anxiety) On home Alprazolam 0.5 mg tid, Bupropion 300 mg daily and Gabapentin 300 mg daily.  - continue home gabapentin and bupropion    Best Practice: Diet: HH IVF: none VTE: Enoxaparin 30 Code: Full  Signature: Lajean Manes, MD  Internal Medicine Resident, PGY-1 [facility]  Pager: [phone] 11/11/20 12:59 PM After 5pm on weekdays and 1pm on weekends: On Call pager [phone]

## 2020-11-10 NOTE — Progress Notes (Signed)
RT set up CPAP and placed on patient. Patient tolerating CPAP well at this time. RT will monitor as needed.

## 2020-11-11 LAB — GLUCOSE, CAPILLARY
Glucose-Capillary: 83 mg/dL (ref 70–99)
Glucose-Capillary: 129 mg/dL — ABNORMAL HIGH (ref 70–99)
Glucose-Capillary: 122 mg/dL — ABNORMAL HIGH (ref 70–99)
Glucose-Capillary: 91 mg/dL (ref 70–99)

## 2020-11-11 MED ORDER — LUMATEPERONE TOSYLATE 42 MG PO CAPS
42.0000 mg | ORAL_CAPSULE | Freq: Every day | ORAL | Status: DC
  Administered 2020-11-11: 42 mg via ORAL
  Filled 2020-11-11 (×2): qty 1

## 2020-11-11 NOTE — TOC Progression Note (Addendum)
Transition of Care [facility]) - Progression Note    Patient Details  Name: Denise Serrano MRN: [id number] Date of Birth: September 06, 1955  Transition [facility] CM/SW Contact  Bartholomew Crews, RN Phone Number: [phone] 11/11/2020, 2:47 PM  Clinical Narrative:     Notified by therapy that patient is making good progress and asking about going home. Spoke with patient on her room phone to discuss transition planning. Demographics and PCP verified. Discussed having help at home. Patient consented to CM calling [name] and her daughter to discuss transition planning.   Spoke with [name] on his mobile phone. [name] stated that she is welcome to come home any time. He stated that he has been her caregiver for 20 years.   He stated that he thought the plan was for her to be discharged on Friday to a nursing facility and he thought that she was going to have a complete medication evaluation. He would like to see her "sober up." [name] stated that her medications come in dosing packets, except her xanax which is separate. He expressed concerns that she is taking too much xanax causing her to stumble and sleep about 20 hours a day.   [name] stated that she does have regular mental health appointments with providers for both medications and counseling. [name] takes her to her medical appointments.   Discussed HH - [name] is agreeable stating that she has had this before. Discussed that [facility] had previously worked with patient. Spoke with Alyse Low at [facility] about [facility] RN, PT, OT, SW - referral pending. UPDATE: [facility] declined referral. Referral to [facility] pending [facility] assessing if they can meet pscychiatric needs of this patient? [facility] declined referral, unable to meet patient's needs. Referral pending with [facility] - TOC to follow up in AM.   Patient stated that her current DME in the home includes a RW and raised toilet seat, and this was confirmed by [name].   Attempted to reach her  daughter via telephone. Generic message left with CM contact information.   TOC following for transition needs.   Expected Discharge Plan: [place] Barriers to Discharge: SNF Covid, Continued Medical Work up, SNF Pending bed offer  Expected Discharge Plan and Services Expected Discharge Plan: [facility] In-house Referral: Clinical Social Work   Post Acute Care Choice: [place] Living arrangements for the past 2 months: Single Family Home                                       Social Determinants of Health (SDOH) Interventions    Readmission Risk Interventions No flowsheet data found.

## 2020-11-11 NOTE — Progress Notes (Signed)
Occupational Therapy Treatment Patient Details Name: Denise Serrano MRN: [id number] DOB: 05/09/1955 Today's Date: 11/11/2020    History of present illness 65 yo female with onset of fall OOB to floor and subsequent trip to hosp was found to have C6 fracture, nondisplaced and AMS, AKI.  Has significant other who cannot care for her and is asking about LTC.  PMHx:  recent Covid, CKD4, bipolar disorder, DM, HTN, HLD, hypothyroidism, CPAP, asthma, AKI, ECT,   OT comments  Reinforced cervical precautions during bed mobility and ADL. Instructed in IADL she needs to avoid. Pt states her significant other can assist her as needed with this tasks. Updated d/c plan to [facility].   Follow Up Recommendations  Home health OT;Supervision/Assistance - 24 hour    Equipment Recommendations  3 in 1 bedside commode    Recommendations for Other Services      Precautions / Restrictions Precautions Precautions: Fall;Cervical Precaution Booklet Issued: Yes (comment) Precaution Comments: reviewed cervical precautions related to ADL and IADL, provided written handout Required Braces or Orthoses: Cervical Brace Cervical Brace: Hard collar;At all times Restrictions Weight Bearing Restrictions: No Other Position/Activity Restrictions: spinal body mechanics       Mobility Bed Mobility Overal bed mobility: Needs Assistance Bed Mobility: Sidelying to Sit;Sit to Sidelying Rolling: Supervision Sidelying to sit: Supervision     Sit to sidelying: Supervision General bed mobility comments: pt recalling log roll technique from earlier PT visit    Transfers Overall transfer level: Needs assistance Equipment used: Rolling walker (2 wheeled) Transfers: Sit to/from Stand Sit to Stand: Supervision         General transfer comment: cues for hand placement    Balance Overall balance assessment: History of Falls;Needs assistance Sitting-balance support: No upper extremity supported;Feet supported Sitting  balance-Leahy Scale: Good     Standing balance support: Bilateral upper extremity supported Standing balance-Leahy Scale: Poor Standing balance comment: reliant on walker for ambulation, but can release in static standing                           ADL either performed or assessed with clinical judgement   ADL                                       Functional mobility during ADLs: Min guard;Rolling walker General ADL Comments: instructed in two cup method for oral care, pt assures OT that her husband can assist with IADL, becomes tearful when talking about her going to rehab     Vision       Perception     Praxis      Cognition Arousal/Alertness: Awake/alert Behavior During Therapy: WFL for tasks assessed/performed Overall Cognitive Status: No family/caregiver present to determine baseline cognitive functioning                                 General Comments: requires some repetition, asking NT if she was going to bathe her in the bed, cued pt to do as much as she could for herself        Exercises     Shoulder Instructions       General Comments Nursing has been permitting pt to get on/off BSC by herself (and leaving bed alarm off). Discussed safety issues with RN and she agreed to set pt's bed  alarm moving forward    Pertinent Vitals/ Pain       Pain Assessment: Faces Faces Pain Scale: Hurts a little bit Pain Location: neck Pain Descriptors / Indicators: Sore;Discomfort Pain Intervention(s): Monitored during session;Repositioned  Home Living Family/patient expects to be discharged to:: Private residence Living Arrangements: Spouse/significant other Available Help at Discharge: Family;Available 24 hours/day Type of Home: House                       Home Equipment: Walker - 2 wheels   Additional Comments: questionable ability for husband to support pt at home      Prior Functioning/Environment Level of  Independence: Needs assistance  Gait / Transfers Assistance Needed: was ambulatory with no AD ADL's / Homemaking Assistance Needed: husband did not provide assistance at home       Frequency  Min 2X/week        Progress Toward Goals  OT Goals(current goals can now be found in the care plan section)  Progress towards OT goals: Progressing toward goals  Acute Rehab OT Goals Patient Stated Goal: to go home OT Goal Formulation: With patient Time For Goal Achievement: 11/21/20 Potential to Achieve Goals: Good  Plan Discharge plan needs to be updated    Co-evaluation                 AM-PAC OT "6 Clicks" Daily Activity     Outcome Measure   Help from another person eating meals?: None Help from another person taking care of personal grooming?: A Little Help from another person toileting, which includes using toliet, bedpan, or urinal?: A Little Help from another person bathing (including washing, rinsing, drying)?: A Little Help from another person to put on and taking off regular upper body clothing?: None Help from another person to put on and taking off regular lower body clothing?: A Little 6 Click Score: 20    End of Session Equipment Utilized During Treatment: Gait belt;Rolling walker;Cervical collar  OT Visit Diagnosis: Unsteadiness on feet (R26.81);Other abnormalities of gait and mobility (R26.89);Pain;Muscle weakness (generalized) (M62.81)   Activity Tolerance Patient tolerated treatment well   Patient Left  (at sink with NT)   Nurse Communication Mobility status        Time: [id number] OT Time Calculation (min): 26 min  Charges: OT General Charges $OT Visit: 1 Visit OT Treatments $Self Care/Home Management : 23-37 mins  Nestor Lewandowsky, OTR/L Acute Rehabilitation Services Pager: [phone] Office: [phone]   Malka So 11/11/2020, 4:00 PM

## 2020-11-11 NOTE — Progress Notes (Addendum)
Physical Therapy Treatment Patient Details Name: Denise Serrano MRN: [id number] DOB: 08/13/1955 Today's Date: 11/11/2020    History of Present Illness 65 yo female with onset of fall OOB to floor and subsequent trip to hosp was found to have C6 fracture, nondisplaced and AMS, AKI.  Has significant other who cannot care for her and is asking about LTC.  PMHx:  recent Covid, CKD4, bipolar disorder, DM, HTN, HLD, hypothyroidism, CPAP, asthma, AKI, ECT,    PT Comments    Contacted by MD re: pt wanting to go home instead of SNF. Unfortunately, pt continues with safety issues (especially with bed mobility and safe use of RW during transfers) and recommendation remains SNF. Discussed with OT and they plan to see pt today as well and will assess if she was able to retain any of the education provided during PT session. If she does, she may be able to discharge home with increased frequency of therapies and repetition of information.   Addendum-Patient did demonstrate recall of education provided by PT during OT session this pm (8/22). Patient demonstrated better safety with mobility and OT agrees with plan for discharge home. Recommendations updated.     Follow Up Recommendations  Home with HHPT; significant other can provide necessary supervision per discusion with TOC team     Equipment Recommendations  None recommended by PT    Recommendations for Other Services       Precautions / Restrictions Precautions Precautions: Fall;Cervical Precaution Booklet Issued: No Precaution Comments: pt unable to state neck restrictions/precautions; vc for no cervical rotation (she lifts chin out of brace and rotates); vc for bed mobility to protect her neck Required Braces or Orthoses: Cervical Brace Cervical Brace: Hard collar;At all times Restrictions Other Position/Activity Restrictions: spinal body mechanics    Mobility  Bed Mobility Overal bed mobility: Needs Assistance Bed Mobility:  Rolling;Sidelying to Sit;Sit to Sidelying Rolling: Supervision Sidelying to sit: Supervision     Sit to sidelying: Min assist General bed mobility comments: pt self-selects to pull up from supine to sit and when returning to bed, lifts one leg onto bed, then sits and then lowers to supine; educated on neck precautions and vc for proper technique via sidelying (required assist to lower to her side prior to rolling to her back as she almost hits her head as she flops down and back without assist)    Transfers Overall transfer level: Needs assistance Equipment used: Rolling walker (2 wheeled) Transfers: Sit to/from Stand Sit to Stand: Supervision         General transfer comment: vc for safe use of RW; pt tried to "park" RW off to the side as approaching the bed and walk the remaining distance without the RW, however pt corrected and instructed to take RW all the way to the bed and to turn and sit down on the bed (not put one knee up and then sit down)  Ambulation/Gait Ambulation/Gait assistance: Min guard;Min assist Gait Distance (Feet): 60 Feet (x 2 with RW; 20 feet no device with min assist as unsteady) Assistive device: Rolling walker (2 wheeled);None Gait Pattern/deviations: Step-through pattern Gait velocity: reduced   General Gait Details: pt with slowed step-through gait, able to negotiate around curtain and open/close closet doors while using RW   Stairs             Wheelchair Mobility    Modified Rankin (Stroke Patients Only)       Balance Overall balance assessment: History of Falls;Needs assistance Sitting-balance support:  No upper extremity supported;Feet supported Sitting balance-Leahy Scale: Good     Standing balance support: Single extremity supported;Bilateral upper extremity supported Standing balance-Leahy Scale: Poor Standing balance comment: reliant on at least unilateral UE support of RW                            Cognition  Arousal/Alertness: Awake/alert Behavior During Therapy: WFL for tasks assessed/performed Overall Cognitive Status: No family/caregiver present to determine baseline cognitive functioning                                 General Comments: pt follows commands well when she is able to hear therapist adequately. pt has decr safety awareness and has been getting up by herself to go to [facility]      Exercises      General Comments General comments (skin integrity, edema, etc.): Nursing has been permitting pt to get on/off BSC by herself (and leaving bed alarm off). Discussed safety issues with RN and she agreed to set pt's bed alarm moving forward      Pertinent Vitals/Pain Pain Assessment: Faces Faces Pain Scale: Hurts a little bit Pain Location: neck Pain Descriptors / Indicators: Sore;Discomfort Pain Intervention(s): Limited activity within patient's tolerance;Monitored during session    [place] expects to be discharged to:: Private residence Living Arrangements: Spouse/significant other Available Help at Discharge: Family;Available 24 hours/day Type of Home: House       Home Equipment: Walker - 2 wheels Additional Comments: questionable ability for husband to support pt at home    Prior Function Level of Independence: Needs assistance  Gait / Transfers Assistance Needed: was ambulatory with no AD ADL's / Homemaking Assistance Needed: husband did not provide assistance at home     PT Goals (current goals can now be found in the care plan section) Acute Rehab PT Goals Patient Stated Goal: none stated PT Goal Formulation: With patient Time For Goal Achievement: 11/25/20 Potential to Achieve Goals: Good Progress towards PT goals: Goals met and updated - see care plan    Frequency    Min 3X/week      PT Plan Current plan remains appropriate    Co-evaluation              AM-PAC PT "6 Clicks" Mobility   Outcome Measure  Help needed  turning from your back to your side while in a flat bed without using bedrails?: A Little Help needed moving from lying on your back to sitting on the side of a flat bed without using bedrails?: A Little Help needed moving to and from a bed to a chair (including a wheelchair)?: A Little Help needed standing up from a chair using your arms (e.g., wheelchair or bedside chair)?: A Little Help needed to walk in hospital room?: A Little Help needed climbing 3-5 steps with a railing? : A Lot 6 Click Score: 17    End of Session Equipment Utilized During Treatment: Cervical collar Activity Tolerance: Patient tolerated treatment well Patient left: in bed;with call bell/phone within reach;with bed alarm set Nurse Communication: Mobility status;Other (comment) (need for bed alarm) PT Visit Diagnosis: Muscle weakness (generalized) (M62.81);Other abnormalities of gait and mobility (R26.89);Pain     Time: 1540-0867 PT Time Calculation (min) (ACUTE ONLY): 23 min  Charges:  $Gait Training: 8-22 mins $Therapeutic Activity: 8-22 mins  Arby Barrette, PT Pager [phone]    Rexanne Mano 11/11/2020, 1:07 PM

## 2020-11-12 DIAGNOSIS — E039 Hypothyroidism, unspecified: Secondary | ICD-10-CM

## 2020-11-12 LAB — GLUCOSE, CAPILLARY
Glucose-Capillary: 101 mg/dL — ABNORMAL HIGH (ref 70–99)
Glucose-Capillary: 141 mg/dL — ABNORMAL HIGH (ref 70–99)

## 2020-11-12 MED ORDER — ALPRAZOLAM 0.5 MG PO TABS: 0.5000 mg | ORAL_TABLET | Freq: Every evening | ORAL | 0 refills | Status: AC | PRN

## 2020-11-12 MED ORDER — AMLODIPINE BESYLATE 5 MG PO TABS: 5.0000 mg | ORAL_TABLET | Freq: Every day | ORAL | 0 refills | Status: DC

## 2020-11-12 NOTE — Progress Notes (Signed)
Occupational Therapy Treatment Patient Details Name: Solie Hootman MRN: [id number] DOB: 1955-06-29 Today's Date: 11/12/2020    History of present illness 65 yo female with onset of fall OOB to floor and subsequent trip to hosp was found to have C6 fracture, nondisplaced and AMS, AKI.  Has significant other who cannot care for her and is asking about LTC.  PMHx:  recent Covid, CKD4, bipolar disorder, DM, HTN, HLD, hypothyroidism, CPAP, asthma, AKI, ECT,   OT comments  Reviewed handout on cervical precautions and technique for log rolling to get from sideling to sitting on eob. RW used to ambulate to sink to perform hand and face hygiene and to brush hair with supervision and vcs for cervical precaution. Patient asked to return to bed due to complaints of fatigue.  Patient is expected to discharge home with family.  Follow Up Recommendations  Home health OT;Supervision/Assistance - 24 hour    Equipment Recommendations  3 in 1 bedside commode    Recommendations for Other Services      Precautions / Restrictions Precautions Precautions: Fall;Cervical Precaution Booklet Issued: Yes (comment) Precaution Comments: reviewed cervical precautions related to ADL and IADL, provided written handout Required Braces or Orthoses: Cervical Brace Cervical Brace: Hard collar;At all times Restrictions Weight Bearing Restrictions: No       Mobility Bed Mobility Overal bed mobility: Needs Assistance Bed Mobility: Sidelying to Sit;Sit to Sidelying   Sidelying to sit: Supervision     Sit to sidelying: Supervision General bed mobility comments: pt recalling log roll technique from earlier PT visit    Transfers Overall transfer level: Needs assistance Equipment used: Rolling walker (2 wheeled) Transfers: Sit to/from Stand Sit to Stand: Supervision         General transfer comment: cues for hand placement    Balance Overall balance assessment: History of Falls;Needs  assistance Sitting-balance support: No upper extremity supported;Feet supported Sitting balance-Leahy Scale: Good Sitting balance - Comments: no LOB donning socks   Standing balance support: Bilateral upper extremity supported Standing balance-Leahy Scale: Fair Standing balance comment: reliant on walker for ambulation, but can release in standing to perform grooming standing at sink                           ADL either performed or assessed with clinical judgement   ADL       Grooming: Wash/dry hands;Wash/dry face;Brushing hair;Min guard;Standing Grooming Details (indicate cue type and reason): VCS to maintain cervical precautions             Lower Body Dressing: Supervision/safety Lower Body Dressing Details (indicate cue type and reason): Doffed and donned socks while seated on eob. Patient crossed her legs to performed and maintained precautions             Functional mobility during ADLs: Min guard;Rolling walker General ADL Comments: Instructions to not move head when washing face and to move washcloth only     Vision       Perception     Praxis      Cognition Arousal/Alertness: Awake/alert Behavior During Therapy: [facility] for tasks assessed/performed Overall Cognitive Status: No family/caregiver present to determine baseline cognitive functioning                                 General Comments: requires some repetition, asking NT if she was going to bathe her in the bed, cued pt to  do as much as she could for herself        Exercises     Shoulder Instructions       General Comments      Pertinent Vitals/ Pain       Pain Assessment: 0-10 Pain Score: 5  Faces Pain Scale: Hurts a little bit Pain Location: neck Pain Descriptors / Indicators: Sore;Discomfort Pain Intervention(s): Limited activity within patient's tolerance  Home Living                                          Prior  Functioning/Environment              Frequency  Min 2X/week        Progress Toward Goals  OT Goals(current goals can now be found in the care plan section)  Progress towards OT goals: Progressing toward goals  Acute Rehab OT Goals Patient Stated Goal: to go home OT Goal Formulation: With patient Time For Goal Achievement: 11/21/20 Potential to Achieve Goals: Good ADL Goals Pt Will Perform Grooming: with supervision;standing Pt Will Perform Lower Body Bathing: with supervision;sit to/from stand Pt Will Perform Lower Body Dressing: with supervision;sit to/from stand Pt Will Transfer to Toilet: with supervision;ambulating;bedside commode Pt Will Perform Toileting - Clothing Manipulation and hygiene: with supervision;sit to/from stand Additional ADL Goal #1: Pt will adhere to cervical precautions during ADL and mobility.  Plan Discharge plan needs to be updated    Co-evaluation      Reason for Co-Treatment: Other (comment) (due to low activity tolerance) PT goals addressed during session: Mobility/safety with mobility        AM-PAC OT "6 Clicks" Daily Activity     Outcome Measure   Help from another person eating meals?: None Help from another person taking care of personal grooming?: A Little Help from another person toileting, which includes using toliet, bedpan, or urinal?: A Little Help from another person bathing (including washing, rinsing, drying)?: A Little Help from another person to put on and taking off regular upper body clothing?: None Help from another person to put on and taking off regular lower body clothing?: A Little 6 Click Score: 20    End of Session Equipment Utilized During Treatment: Gait belt;Rolling walker;Cervical collar  OT Visit Diagnosis: Unsteadiness on feet (R26.81);Other abnormalities of gait and mobility (R26.89);Pain;Muscle weakness (generalized) (M62.81)   Activity Tolerance Patient tolerated treatment well   Patient Left   (at sink with NT)   Nurse Communication Mobility status        Time: [id number] OT Time Calculation (min): 19 min  Charges: OT General Charges $OT Visit: 1 Visit  Lodema Hong, [place] 11/12/2020, 1:37 PM

## 2020-11-12 NOTE — Discharge Summary (Signed)
Name: Denise Serrano MRN: [id number] DOB: [birth year] 65 y.o. PCP: Katherina Mires, MD  Date of Admission: 11/05/2020  9:53 AM Date of Discharge:  11/12/20 Attending Physician: Dr. Daryll Drown  DISCHARGE DIAGNOSIS:  Primary Problem: C6 cervical fracture [facility])   Hospital Problems: Principal Problem:   C6 cervical fracture ([place]) Active Problems:   Type 2 diabetes mellitus with diabetic chronic kidney disease ([place])   AKI (acute kidney injury) ([place])   Bipolar disorder ([place])   Essential hypertension   Hypothyroidism   Hyperlipidemia   Asthma exacerbation   CKD (chronic kidney disease) stage 4, GFR 15-29 ml/min (HCC)   Accidental fall from bed   COVID    DISCHARGE MEDICATIONS:   Allergies as of 11/12/2020       Reactions   Amoxicillin Diarrhea   Iodine Other (See Comments)   Betadine soap causes her to want to pass out   Penicillin G Diarrhea        Medication List     STOP taking these medications    adapalene 0.1 % gel Commonly known as: DIFFERIN   docusate sodium 100 MG capsule Commonly known as: COLACE   metroNIDAZOLE 1 % gel Commonly known as: METROGEL   ranitidine 300 MG tablet Commonly known as: ZANTAC       TAKE these medications    albuterol (2.5 MG/3ML) 0.083% nebulizer solution Commonly known as: PROVENTIL Take 3 mLs (2.5 mg total) by nebulization every 4 (four) hours as needed for wheezing or shortness of breath.   ALPRAZolam 0.5 MG tablet Commonly known as: XANAX Take 1 tablet (0.5 mg total) by mouth at bedtime as needed for anxiety. What changed:  when to take this reasons to take this additional instructions   amLODipine 5 MG tablet Commonly known as: NORVASC Take 1 tablet (5 mg total) by mouth daily. Start taking on: November 13, 2020 What changed:  medication strength additional instructions   aspirin EC 81 MG tablet Take 81 mg by mouth daily.   atorvastatin 20 MG tablet Commonly known as: LIPITOR Take 20 mg by mouth daily.    Austedo 12 MG Tabs Generic drug: Deutetrabenazine Take 28 mg by mouth 2 (two) times daily.   budesonide-formoterol 80-4.5 MCG/ACT inhaler Commonly known as: SYMBICORT Inhale 2 puffs into the lungs 2 (two) times daily as needed (For shortness of breath).   buPROPion 300 MG 24 hr tablet Commonly known as: WELLBUTRIN XL Take 300 mg by mouth daily.   Caplyta 42 MG capsule Generic drug: lumateperone tosylate Take 42 mg by mouth daily.   cariprazine 3 MG capsule Commonly known as: VRAYLAR Take by mouth daily. PM   carvedilol 12.5 MG tablet Commonly known as: COREG Take 12.5 mg by mouth 2 (two) times daily with a meal.   cetirizine 10 MG tablet Commonly known as: ZYRTEC Take 10 mg by mouth daily.   fesoterodine 4 MG Tb24 tablet Commonly known as: TOVIAZ Take 4 mg by mouth daily.   gabapentin 300 MG capsule Commonly known as: NEURONTIN Take 300 mg by mouth daily.   levothyroxine 50 MCG tablet Commonly known as: SYNTHROID Take 50 mcg by mouth daily.   mirabegron ER 25 MG Tb24 tablet Commonly known as: MYRBETRIQ Take 25 mg by mouth daily. am   montelukast 5 MG chewable tablet Commonly known as: SINGULAIR Chew 5 mg by mouth daily. am   multivitamin with minerals Tabs tablet Take 1 tablet by mouth 2 (two) times daily.   traZODone 50 MG tablet Commonly  known as: DESYREL Take 50 mg by mouth at bedtime as needed for sleep. 1-3 as needed for sleep   valACYclovir 500 MG tablet Commonly known as: VALTREX Take 500 mg by mouth 2 (two) times daily. As needed               Durable Medical Equipment  (From admission, onward)           Start     Ordered   11/12/20 1249  For home use only DME 3 n 1  Once        11/12/20 1249            DISPOSITION AND FOLLOW-UP:  Denise Serrano was discharged from [facility] in Stable condition. At the hospital follow up visit please address:  Follow-up Recommendations: Consults:  Neurosurgeon Labs:  none  Studies: none  Medications: none   Follow-up Appointments:  Follow-up Information     Eustace Moore, MD. Schedule an appointment as soon as possible for a visit in 2 week(s).   Specialty: Neurosurgery Contact information: [address] [place] [address] [phone]         Care, [place] Follow up.   Why: Someone from [facility] will contact you to arrange start date and time for your therapy. If you have any problems please call Malachy Mood at [phone] Contact information: [place] [address] [phone]         Katherina Mires, MD. Schedule an appointment as soon as possible for a visit in 1 week(s).   Specialty: Family Medicine Why: make an appointment with you PCP for next week for your post hospitalization appointment to dicsuss your care. Contact information: [place] [address] [phone]                 HOSPITAL COURSE:  Patient Summary: Denise Serrano is a 65 year old person with a pmhx of Bipolar I Disorder s/p ECT on 7/25, CKD G4, diet controlled T2DM, HTN, HLD, and hypothyroidism h/w a nondisplaced C6 facet and anterior osteophyte fracture after a fall on 8/16.   Non-displaced fracture of sub-axial cervical spine from a fall Pt with a fall from bed on 8/16, brought to [facility] via EMS and found to have a nondisplaced C6 facet and anterior osteophyte fracture on CT. Neurosurgery recommends Aspen collar and outpatient follow up in 2 weeks. She had an elevated CK at 4636->5152->4835->4029 which was concerning for a seizure. EEG on 8/17 showed nonspecific findings of mild diffuse encephalopathy, but unable to rule out isolated seizure. Psych consulted on 8/17 for concern of polypharmacy however they did not recommend further adjustment of her medications at the time. Of note, patient did have DEXA scan in 2017 showing osteoporosis (T score < -2.5  at spine and hip) so this may be fragility fracture. She will have home health PT at discharge to help her regain strength and mobility.   Acute on CKD G4 2/2 Rhabdomyolysis  Pt presented with an elevated creatinine and BUN. UA with myoglobinuria and CK elevated at 5,152, suggestive of rhabdo. No electrolyte derangements. She received IV fluids, and renal function was monitored.   Recent COVID Infection Tested positive during admission on 8/16. Remained asymptomatic during hospitalization.  Diet controlled with T2DM Last A1c in 2016 at 7.0. Most recent A1c on 8/17 at 6. Not on home medications. She received SSI during hospitalizations.   Essential HTN She was mostly normotensive  during her hospitalization. She did not get any amlodipine as she does not take it at home.   HLD On home atorvastatin with last LDL of 34 and HDL 29 in 2016. Recent lipid panel on 8/17 with LDL of 72 and HDL of 35. She initially received her home Atorvastatin 20 mg daily but it was held on 8/17 in setting of her elevated CK, concerning for rhabdomyolysis.   Hypothyroidism Last TSH normal at 3.257 and T4 at 0.88. Recently on 8/17, TSH wnl at 1.245. Resumed home levothyroxine 50 mcg daily.   Bipolar I disorder s/p ECT ECT on August 8th. Psych consulted and they recommend continuing home medications while inpatient. So patient was resumed on home Cariprazine 3 mg daily and Lumateperone tosylate 42 mg daily.  Mood disorder (depression/anxiety) Pt has a history of anxiety and depression s/p ECT recently. On home Alprazolam 0.5 mg tid, Bupropion 300 mg daily and Gabapentin 300 mg daily. We continued all home meds.     DISCHARGE INSTRUCTIONS:  Denise Serrano you had a fall resulting in a non displaced fracture of sub-axial cervical spine. Ct cervical spine results show a nondisplaced left superior C6 facet fracture and an anterior osteophyte fracture. No neurosurgical intervention is warranted. You are recommend an aspen  collar and follow up in the office in neurosurgery office as listed on your discharge papers. Please continue to work with the home health PT to ensure you regain strength and prevent any future falls. Also, please visit your PCP for your post hospitalization visit to discuss any potential changes that need to be made.   Discharge Instructions     Call MD for:  difficulty breathing, headache or visual disturbances   Complete by: As directed    Call MD for:  hives   Complete by: As directed    Call MD for:  persistant dizziness or light-headedness   Complete by: As directed    Call MD for:  persistant nausea and vomiting   Complete by: As directed    Call MD for:  redness, tenderness, or signs of infection (pain, swelling, redness, odor or green/yellow discharge around incision site)   Complete by: As directed    Call MD for:  severe uncontrolled pain   Complete by: As directed    Call MD for:  temperature >100.4   Complete by: As directed    Diet - low sodium heart healthy   Complete by: As directed    Increase activity slowly   Complete by: As directed        SUBJECTIVE:  No acute overnight events. Patient was seen at bedside during rounds this morning. Pt reports feeling "great" this morning. Pt denies confusion, chest pain, SOB, headache or leg pain. No other complains or concerns at this time. She is looking forward to working with PT today, and plans to continue working with PT home health.   Discharge Vitals:   BP (!) 107/57 (BP Location: Right Arm)   Pulse (!) 57   Temp 98.3 F (36.8 C) (Oral)   Resp 18   Ht 5' (1.524 m)   Wt 70.4 kg   SpO2 94%   BMI 30.31 kg/m   OBJECTIVE:  Constitutional: alert, well-appearing, in no acute distress HENT: normocephalic, atraumatic, mucous membranes moist, wearing c collar Eyes: conjunctiva non-erythematous, extraocular movements intact Neck: supple Cardiovascular: regular rate and rhythm, no m/r/g Pulmonary/Chest: normal work  of breathing on room air, lungs clear to auscultation bilaterally Abdominal: soft, non-tender to palpation,  non-distended Neurological: alert & oriented x 3  Skin: warm and dry Psych: normal behavior, normal affect   Pertinent Labs, Studies, and Procedures:  CBC Latest Ref Rng & Units 11/07/2020 11/06/2020 11/05/2020  WBC 4.0 - 10.5 K/uL 9.2 10.6(H) 11.6(H)  Hemoglobin 12.0 - 15.0 g/dL 13.6 13.3 14.8  Hematocrit 36.0 - 46.0 % 39.4 40.3 44.8  Platelets 150 - 400 K/uL 157 148(L) 167    CMP Latest Ref Rng & Units 11/08/2020 11/07/2020 11/06/2020  Glucose 70 - 99 mg/dL 98 96 102(H)  BUN 8 - 23 mg/dL 18 24(H) 30(H)  Creatinine 0.44 - 1.00 mg/dL 1.87(H) 1.82(H) 2.35(H)  Sodium 135 - 145 mmol/L 142 140 139  Potassium 3.5 - 5.1 mmol/L 3.9 3.8 3.5  Chloride 98 - 111 mmol/L 109 107 104  CO2 22 - 32 mmol/L '25 27 25  '$ Calcium 8.9 - 10.3 mg/dL 8.9 8.8(L) 9.3  Total Protein 6.5 - 8.1 g/dL 5.3(L) 5.4(L) 6.1(L)  Total Bilirubin 0.3 - 1.2 mg/dL 0.8 0.8 1.0  Alkaline Phos 38 - 126 U/L 49 54 62  AST 15 - 41 U/L 73(H) 88(H) 100(H)  ALT 0 - 44 U/L 36 36 35    DG Thoracic Spine 2 View  Result Date: 11/05/2020 CLINICAL DATA:  Back pain after fall. EXAM: THORACIC SPINE 2 VIEWS COMPARISON:  Chest x-ray dated November 17, 2019. FINDINGS: There is no evidence of thoracic spine fracture. Alignment is normal. No other significant bone abnormalities are identified. IMPRESSION: Negative. Electronically Signed   By: Titus Dubin M.D.   On: 11/05/2020 11:14   DG Lumbar Spine Complete  Result Date: 11/05/2020 CLINICAL DATA:  Status post fall.  Pain. EXAM: LUMBAR SPINE - COMPLETE 4+ VIEW COMPARISON:  None. FINDINGS: Normal alignment. The vertebral body heights and disc spaces are well preserved. The facet joints appear well aligned. No fracture or dislocation. Atherosclerotic calcifications noted involving the aorta. IMPRESSION: 1. No acute findings. 2. Atherosclerosis. Electronically Signed   By: Kerby Moors M.D.    On: 11/05/2020 11:10   DG Wrist Complete Right  Result Date: 11/05/2020 CLINICAL DATA:  Pain after fall. EXAM: RIGHT WRIST - COMPLETE 3+ VIEW COMPARISON:  None. FINDINGS: There is no evidence of fracture or dislocation. There is no evidence of arthropathy or other focal bone abnormality. Soft tissues are unremarkable. IMPRESSION: Negative. Electronically Signed   By: Kerby Moors M.D.   On: 11/05/2020 11:06   CT HEAD WO CONTRAST (5MM)  Result Date: 11/05/2020 CLINICAL DATA:  65 year old female with head trauma EXAM: CT HEAD WITHOUT CONTRAST CT CERVICAL SPINE WITHOUT CONTRAST TECHNIQUE: Multidetector CT imaging of the head and cervical spine was performed following the standard protocol without intravenous contrast. Multiplanar CT image reconstructions of the cervical spine were also generated. COMPARISON:  None. FINDINGS: CT HEAD FINDINGS Brain: No acute intracranial hemorrhage. No midline shift or mass effect. Gray-white differentiation maintained. Unremarkable appearance of the ventricular system. Vascular: Calcifications of the intracranial anterior circulation. Skull: No acute fracture.  No aggressive bone lesion identified. Sinuses/Orbits: Partial opacification of the left-sided mastoid air cells. Right-sided mastoid clear. Paranasal sinuses are clear. Trace mucosal disease of the frontal sinuses. Other: None CT CERVICAL SPINE FINDINGS Alignment: Craniocervical junction aligned. Anatomic alignment of the cervical elements. No subluxation. Skull base and vertebrae: Craniocervical junction unremarkable. Acute nondisplaced fracture of the left sixth superior facet, seen on the parasagittal images. Associated small chip fracture of C6 anterior left osteophyte. Soft tissues and spinal canal: Unremarkable cervical soft tissues. No lymphadenopathy. Disc  levels: C2-C3, right facet hypertrophy with no canal narrowing or foraminal narrowing. C3-C4: No significant canal narrowing or foraminal narrowing C4-C5: No  significant canal narrowing or foraminal narrowing C5-C6: Disc space narrowing with posterior disc osteophyte complex and uncovertebral joint disease. No significant canal narrowing. Mild bilateral foraminal narrowing C6-C7: No canal narrowing.  No significant foraminal narrowing Upper chest: Unremarkable appearance of the lung apices. Other: No bony canal narrowing. IMPRESSION: Head CT: No acute intracranial abnormality. Cervical CT: Acute nondisplaced fracture of the left C6 superior facet, as well C6 anterior osteophyte fracture. Electronically Signed   By: Corrie Mckusick D.O.   On: 11/05/2020 11:29   CT Cervical Spine Wo Contrast  Result Date: 11/05/2020 CLINICAL DATA:  65 year old female with head trauma EXAM: CT HEAD WITHOUT CONTRAST CT CERVICAL SPINE WITHOUT CONTRAST TECHNIQUE: Multidetector CT imaging of the head and cervical spine was performed following the standard protocol without intravenous contrast. Multiplanar CT image reconstructions of the cervical spine were also generated. COMPARISON:  None. FINDINGS: CT HEAD FINDINGS Brain: No acute intracranial hemorrhage. No midline shift or mass effect. Gray-white differentiation maintained. Unremarkable appearance of the ventricular system. Vascular: Calcifications of the intracranial anterior circulation. Skull: No acute fracture.  No aggressive bone lesion identified. Sinuses/Orbits: Partial opacification of the left-sided mastoid air cells. Right-sided mastoid clear. Paranasal sinuses are clear. Trace mucosal disease of the frontal sinuses. Other: None CT CERVICAL SPINE FINDINGS Alignment: Craniocervical junction aligned. Anatomic alignment of the cervical elements. No subluxation. Skull base and vertebrae: Craniocervical junction unremarkable. Acute nondisplaced fracture of the left sixth superior facet, seen on the parasagittal images. Associated small chip fracture of C6 anterior left osteophyte. Soft tissues and spinal canal: Unremarkable cervical  soft tissues. No lymphadenopathy. Disc levels: C2-C3, right facet hypertrophy with no canal narrowing or foraminal narrowing. C3-C4: No significant canal narrowing or foraminal narrowing C4-C5: No significant canal narrowing or foraminal narrowing C5-C6: Disc space narrowing with posterior disc osteophyte complex and uncovertebral joint disease. No significant canal narrowing. Mild bilateral foraminal narrowing C6-C7: No canal narrowing.  No significant foraminal narrowing Upper chest: Unremarkable appearance of the lung apices. Other: No bony canal narrowing. IMPRESSION: Head CT: No acute intracranial abnormality. Cervical CT: Acute nondisplaced fracture of the left C6 superior facet, as well C6 anterior osteophyte fracture. Electronically Signed   By: Corrie Mckusick D.O.   On: 11/05/2020 11:29   US RENAL  Result Date: 11/05/2020 CLINICAL DATA:  Acute kidney injury. EXAM: RENAL / URINARY TRACT ULTRASOUND COMPLETE COMPARISON:  Renal ultrasound 06/29/2014. FINDINGS: Right Kidney: Renal measurements: 7.8 x 5.2 x 3.4 cm = volume: 71 mL. Diffusely marked increased parenchymal echogenicity. There is no hydronephrosis. Cyst seen on prior exam is not well visualized currently. Left Kidney: Renal measurements: 8.4 x 3.9 x 3.2 cm = volume: 55 mL. Diffusely marked increased parenchymal echogenicity. There is no hydronephrosis. No obvious focal renal abnormality. Bladder: Appears normal for degree of bladder distention. Other: None. IMPRESSION: 1. Diffusely increased parenchymal echogenicity consistent with chronic medical renal disease. Kidneys are markedly echogenic. 2. No obstructive uropathy. Electronically Signed   By: Keith Rake M.D.   On: 11/05/2020 18:19   EEG adult  Result Date: 11/06/2020 Lora Havens, MD     11/06/2020 12:18 PM Patient Name: Pamelia Mcgurl MRN: [id number] Epilepsy Attending: Lora Havens Referring Physician/Provider: Dr Virl Axe Date: 11/06/2020 Duration: 25.23 mins Patient  history: 65 year old female with head trauma and altered mental status.  EEG to evaluate for seizures. Level of alertness: Awake,  asleep AEDs during EEG study: GBP Technical aspects: This EEG study was done with scalp electrodes positioned according to the 10-20 International system of electrode placement. Electrical activity was acquired at a sampling rate of '500Hz'$  and reviewed with a high frequency filter of '70Hz'$  and a low frequency filter of '1Hz'$ . EEG data were recorded continuously and digitally stored. Description: The posterior dominant rhythm consists of 7.'5Hz'$  activity of moderate voltage (25-35 uV) seen predominantly in posterior head regions, symmetric and reactive to eye opening and eye closing. Sleep was characterized by vertex waves, sleep spindles (12 to 14 Hz), maximal frontocentral region.  EEG showed continuous generalized 5 to 7 Hz theta slowing. Hyperventilation and photic stimulation were not performed.   ABNORMALITY - Continuous slow, generalized IMPRESSION: This study is suggestive of mild diffuse encephalopathy, nonspecific etiology. No seizures or epileptiform discharges were seen throughout the recording. Lora Havens   DG Hip Unilat W or Wo Pelvis 2-3 Views Left  Result Date: 11/05/2020 CLINICAL DATA:  Left hip pain after fall. EXAM: DG HIP (WITH OR WITHOUT PELVIS) 2-3V LEFT COMPARISON:  CT abdomen pelvis dated May 04, 2014. FINDINGS: There is no evidence of hip fracture or dislocation. There is no evidence of arthropathy or other focal bone abnormality. Dystrophic soft tissue calcification overlying the left hip. IMPRESSION: Negative. Electronically Signed   By: Titus Dubin M.D.   On: 11/05/2020 11:11     Signed: Lajean Manes, MD Internal Medicine Resident, PGY-1 [facility]  Pager: [phone] 1:37 PM, 11/12/2020

## 2020-11-12 NOTE — TOC Transition Note (Addendum)
Transition of Care [facility]) - CM/SW Discharge Note   Patient Details  Name: Denise Serrano MRN: [id number] Date of Birth: Nov 18, 1955  Transition of Care [facility] CM/SW Contact:  Ninfa Meeker, RN Phone Number: 11/12/2020, 9:15 AM   Clinical Narrative:  Case Manager contacted Sharmon Revere with [facility] regarding referral for patient. She states they will accept patient for Home Health. CM notified MD and called patient's significant other, [name] who is her caregiver.       Barriers to Discharge: SNF Covid, Continued Medical Work up, SNF Pending bed offer   Patient Goals and CMS Choice Patient states their goals for this hospitalization and ongoing recovery are:: Rehab CMS Medicare.gov Compare Post Acute Care list provided to:: Patient Choice offered to / list presented to : Patient, Spouse  Discharge Placement                       Discharge Plan and Services In-house Referral: Clinical Social Work   Post Acute Care Choice: [place]                               Social Determinants of Health (SDOH) Interventions     Readmission Risk Interventions No flowsheet data found.

## 2020-11-12 NOTE — Discharge Instructions (Addendum)
Denise Serrano you had a fall resulting in a non displaced fracture of sub-axial cervical spine. Ct cervical spine results show a nondisplaced left superior C6 facet fracture and an anterior osteophyte fracture. No neurosurgical intervention is warranted. You are recommend an aspen collar and follow up in the office in neurosurgery office as listed on your discharge papers. Please continue to work with the home health PT to ensure you regain strength and prevent any future falls. Also, please visit your PCP for your post hospitalization visit to discuss any potential changes that need to be made.

## 2020-11-12 NOTE — Progress Notes (Signed)
Patient discharged from [phone]. IV d/c'd. AVS reviewed and all questions answered. All pharmacy meds returned to patient. Documentation in hard chart.  Patient has all belongings. 3in1 delivered to room and taken with patient.

## 2020-11-12 NOTE — Progress Notes (Signed)
Physical Therapy Treatment Patient Details Name: Denise Serrano MRN: [id number] DOB: 04/29/1955 Today's Date: 11/12/2020    History of Present Illness 65 yo female with onset of fall OOB to floor and subsequent trip to hosp was found to have C6 fracture, nondisplaced and AMS, AKI.  Has significant other who cannot care for her and is asking about LTC.  PMHx:  recent Covid, CKD4, bipolar disorder, DM, HTN, HLD, hypothyroidism, CPAP, asthma, AKI, ECT,    PT Comments    Patient reporting incr neck pain today and used the opportunity to educate on this being the reason she needs to follow cervical precautions. Pt able to verbalize precautions with min cues. Also min cues to maintain precautions. Overall min-guard assist for mobility with cues for safe use of RW. Wll benefit from continued education via HHPT.     Follow Up Recommendations  Home health PT;Supervision for mobility/OOB     Equipment Recommendations  None recommended by PT    Recommendations for Other Services       Precautions / Restrictions Precautions Precautions: Fall;Cervical Precaution Booklet Issued: Yes (comment) Precaution Comments: reviewed cervical precautions related to ADL and IADL and mobility Required Braces or Orthoses: Cervical Brace Cervical Brace: Hard collar;At all times Restrictions Weight Bearing Restrictions: No    Mobility  Bed Mobility Overal bed mobility: Needs Assistance Bed Mobility: Sidelying to Sit;Sit to Sidelying   Sidelying to sit: Supervision     Sit to sidelying: Supervision General bed mobility comments: combined rolling and coming up onto elbow as exiting bed; return to bed recalled she should pass thru sidelying, however needed cues to maintain alignment/move into sidelying to prevent premature rolling into supine before legs elevated    Transfers Overall transfer level: Needs assistance Equipment used: Rolling walker (2 wheeled) Transfers: Sit to/from Stand Sit to Stand:  Supervision         General transfer comment: cues for hand placement  Ambulation/Gait Ambulation/Gait assistance: Min guard Gait Distance (Feet): 50 Feet Assistive device: Rolling walker (2 wheeled);None Gait Pattern/deviations: Step-through pattern Gait velocity: reduced   General Gait Details: pt with slowed step-through gait, able to negotiate around curtain   Stairs             Wheelchair Mobility    Modified Rankin (Stroke Patients Only)       Balance Overall balance assessment: History of Falls;Needs assistance Sitting-balance support: No upper extremity supported;Feet supported Sitting balance-Leahy Scale: Good Sitting balance - Comments: no LOB donning socks   Standing balance support: Bilateral upper extremity supported Standing balance-Leahy Scale: Fair Standing balance comment: reliant on walker for ambulation, but can release in static standing                            Cognition Arousal/Alertness: Awake/alert Behavior During Therapy: WFL for tasks assessed/performed Overall Cognitive Status: No family/caregiver present to determine baseline cognitive functioning                                 General Comments: able to recall information/education from 8/22      Exercises      General Comments        Pertinent Vitals/Pain Pain Assessment: 0-10 Pain Score: 5  Pain Location: neck Pain Descriptors / Indicators: Sore;Discomfort Pain Intervention(s): Limited activity within patient's tolerance;Monitored during session;Repositioned    Home Living  Prior Function            PT Goals (current goals can now be found in the care plan section) Acute Rehab PT Goals Patient Stated Goal: to go home Time For Goal Achievement: 11/25/20 Potential to Achieve Goals: Good Progress towards PT goals: Progressing toward goals    Frequency    Min 3X/week      PT Plan Current plan  remains appropriate    Co-evaluation PT/OT/SLP Co-Evaluation/Treatment: Yes Reason for Co-Treatment: Other (comment) (pt's pain level and limited tolerance for activity with d/c home today pending) PT goals addressed during session: Mobility/safety with mobility;Proper use of DME        AM-PAC PT "6 Clicks" Mobility   Outcome Measure  Help needed turning from your back to your side while in a flat bed without using bedrails?: A Little Help needed moving from lying on your back to sitting on the side of a flat bed without using bedrails?: A Little Help needed moving to and from a bed to a chair (including a wheelchair)?: A Little Help needed standing up from a chair using your arms (e.g., wheelchair or bedside chair)?: A Little Help needed to walk in hospital room?: A Little Help needed climbing 3-5 steps with a railing? : A Lot 6 Click Score: 17    End of Session Equipment Utilized During Treatment: Cervical collar Activity Tolerance: Patient limited by pain Patient left: in bed;with call bell/phone within reach;with bed alarm set Nurse Communication: Mobility status PT Visit Diagnosis: Muscle weakness (generalized) (M62.81);Other abnormalities of gait and mobility (R26.89);Pain Pain - Right/Left:  (neck) Pain - part of body:  (neck)     Time: [id number] PT Time Calculation (min) (ACUTE ONLY): 19 min  Charges:  $Therapeutic Activity: 8-22 mins                      Arby Barrette, PT Pager [phone]    Rexanne Mano 11/12/2020, 12:35 PM

## 2020-11-13 NOTE — Consult Note (Signed)
Patient presents from home after being found down at home, unknown downtime on the floor.  psych consult was placed for assistance with authorization of medications(history of bipolar disease, depression, anxiety).  Patient is currently receiving psychiatric services at [facility] behavioral health outpatient in [place] with Dr. Georgiann Mohs.  She is also undergoing therapy for ECT, under the service of Dr. Alethia Berthold.  Chart review shows patient his home medications have been resumed to include Wellbutrin XL 300 mg, Vraylar 3 mg, Duetrabenzine 28 p.o. twice daily.  There seems to be some confusion regarding her use and compliance alprazolam 0.5 mg, last prescription was received October 28, 2020.  It remains unclear if her fall was related to possible benzodiazepine use or withdrawal, her urine drug screen on admission was positive for benzodiazepines. Her patient's current medication regimen is appropriate for her underlying bipolar disorder.  As noted she is currently receiving services via outpatient psychiatry, and will not adjust at this time.  Patient is seen and assessed by this nurse practitioner, patient denies any psychiatric symptoms at this time. She is alert and oriented x 3, calm and cooperative. She denies any depressive symptoms at this time. Her mood is described as "okay" and her affect is congruent. Her memory is good, she is able to have a linear conversation. Her speech is normal, and does not appear to be pressured. She appears to have insight and judgement, both are intact. She currently denies suicidal ideation, homicidal ideation, and or hallucinations. She is more focused on her pain relief and her neck fracture. She is observed lying flat in the bed with a C_collar on, and has been cleared by neurosurgery to follow-up outpatient.    Bipolar disorder: Patient is currently being managed by outpatient psychiatry, per chart review has been visiting weekly.  She last received ECT treatment 1  week ago on August 8, in which this session was discontinued due to no improvement in her depressive symptoms.  We will continue home medications, as patient is denying any psychiatric symptoms at this time.  -Will recommend avoiding any additional antipsychotics, as patient has elevated CK of 4636 on admission.  As previously noted this could also be due to her fall, unknown downtime on the floor.  -

## 2020-12-04 ENCOUNTER — Other Ambulatory Visit: Payer: Self-pay | Admitting: Internal Medicine

## 2021-01-12 ENCOUNTER — Other Ambulatory Visit: Payer: Self-pay | Admitting: Internal Medicine

## 2021-09-30 ENCOUNTER — Other Ambulatory Visit: Payer: Self-pay | Admitting: *Deleted

## 2021-09-30 ENCOUNTER — Encounter: Payer: Self-pay | Admitting: *Deleted

## 2021-10-02 ENCOUNTER — Ambulatory Visit (INDEPENDENT_AMBULATORY_CARE_PROVIDER_SITE_OTHER): Payer: Medicare Other | Admitting: Diagnostic Neuroimaging

## 2021-10-02 ENCOUNTER — Encounter: Payer: Self-pay | Admitting: Diagnostic Neuroimaging

## 2021-10-02 VITALS — BP 130/80 | HR 66 | Ht 60.0 in | Wt 137.0 lb

## 2021-10-02 DIAGNOSIS — G2401 Drug induced subacute dyskinesia: Secondary | ICD-10-CM

## 2021-10-02 NOTE — Patient Instructions (Signed)
TARDIVE DYSKINESIA (prior vraylar, caplyta)  - treatment resistant (failed austedo and ingrezza)  - consider to wean off ingrezza; then start tetrabenazine 12.5mg  daily; then increase to 12.5mg  twice a day after 1-2 weeks; then increase to 12.5mg  three times a day  - consider to change xanax to clonazepam (which may help TD before bedtime)  - consider referral to academic center for consideration of DBS

## 2021-10-02 NOTE — Progress Notes (Signed)
[facility]  PATIENT: Denise Serrano DOB: [birth year]  REFERRING CLINICIAN: Chucky May, MD HISTORY FROM: patient REASON FOR VISIT: new consult   HISTORICAL  CHIEF COMPLAINT:  Chief Complaint  Patient presents with   Follow-up    RM 7 Alone Pt is well, is having involuntary movement in mouth and jaw for over a yr     HISTORY OF PRESENT ILLNESS:   66 year old female with bipolar disorder here for evaluation of tardive dyskinesia.  Patient has been on prior atypical antipsychotic medications including Vraylar.  Around 2 years ago she started to have abnormal involuntary movements of her mouth and tongue.  She was diagnosed with tardive dyskinesia and tried on Ingrezza and then switched to [place].  Now she is back on Ingrezza.  Unfortunately his medicines have not helped.  Vraylar was switched to [place], which has not helped.   REVIEW OF SYSTEMS: Full 14 system review of systems performed and negative with exception of: as per HPI.  ALLERGIES: Allergies  Allergen Reactions   Amoxicillin Diarrhea   Iodine Other (See Comments)    Betadine soap causes her to want to pass out   Penicillin G Diarrhea    HOME MEDICATIONS: Outpatient Medications Prior to Visit  Medication Sig Dispense Refill   albuterol (PROVENTIL) (2.5 MG/3ML) 0.083% nebulizer solution Take 3 mLs (2.5 mg total) by nebulization every 4 (four) hours as needed for wheezing or shortness of breath. 75 mL 0   ALPRAZolam (XANAX) 0.5 MG tablet TAKE 1 TABLET BY MOUTH FOUR TIMES A DAY AS NEEDED     amLODipine (NORVASC) 5 MG tablet Take 1 tablet (5 mg total) by mouth daily. 30 tablet 0   aspirin EC 81 MG tablet Take 81 mg by mouth daily.     atorvastatin (LIPITOR) 20 MG tablet Take 20 mg by mouth daily.     budesonide-formoterol (SYMBICORT) 80-4.5 MCG/ACT inhaler Inhale 2 puffs into the lungs 2 (two) times daily as needed (For shortness of breath).     buPROPion (WELLBUTRIN XL) 300 MG 24 hr tablet  Take 300 mg by mouth daily.     cariprazine (VRAYLAR) 3 MG capsule Take by mouth daily. PM     carvedilol (COREG) 12.5 MG tablet Take 12.5 mg by mouth 2 (two) times daily with a meal.     cetirizine (ZYRTEC) 10 MG tablet Take 10 mg by mouth daily.     fesoterodine (TOVIAZ) 4 MG TB24 tablet Take 4 mg by mouth daily.     gabapentin (NEURONTIN) 300 MG capsule Take 300 mg by mouth daily.     levothyroxine (SYNTHROID, LEVOTHROID) 50 MCG tablet Take 50 mcg by mouth daily.     lumateperone tosylate (CAPLYTA) 42 MG capsule Take 42 mg by mouth daily.     mirabegron ER (MYRBETRIQ) 25 MG TB24 tablet Take 25 mg by mouth daily. am     montelukast (SINGULAIR) 5 MG chewable tablet Chew 5 mg by mouth daily. am     Multiple Vitamin (MULTIVITAMIN WITH MINERALS) TABS tablet Take 1 tablet by mouth 2 (two) times daily.     traZODone (DESYREL) 50 MG tablet Take 50 mg by mouth at bedtime as needed for sleep. 1-3 as needed for sleep     valACYclovir (VALTREX) 500 MG tablet Take 500 mg by mouth 2 (two) times daily. As needed     valbenazine (INGREZZA) 80 MG capsule 80 mg at bedtime.     Deutetrabenazine (AUSTEDO) 12 MG TABS Take 28 mg by  mouth 2 (two) times daily. (Patient not taking: Reported on 10/02/2021)     No facility-administered medications prior to visit.    PAST MEDICAL HISTORY: Past Medical History:  Diagnosis Date   Anxiety    Arthritis of knee, left    Asthma    Bipolar 1 disorder, mixed ([place])    Carpal tunnel syndrome    bilateral   Cataract immature BILATERAL   Chronic kidney disease (CKD), stage III (moderate) ([place]) MONITORED BY DR Moshe Cipro   Diabetes mellitus without complication ([place])    no meds.  diet control   Elevated blood pressure (not hypertension) borderline-  monitored   Hyperlipidemia    Hypertension    Hypothyroidism    Impaired hearing hearing aid- bilateral   Nocturia    OSA on CPAP    Seasonal allergies    Stress incontinence, female    Tardive dyskinesia    Urgency  of urination     PAST SURGICAL HISTORY: Past Surgical History:  Procedure Laterality Date   BLADDER SUSPENSION  05/07/2011   Procedure: SPARC PROCEDURE;  Surgeon: Reece Packer, MD;  Location: [facility];  Service: Urology;  Laterality: N/A;  cysto, sparc sling    DX LAPAROSCOPY FOR INFERTILITY  27 YRS AGO   TUBAL LIGATION  20 YRS AGO    FAMILY HISTORY: Family History  Problem Relation Age of Onset   Heart attack Mother    Heart attack Father     SOCIAL HISTORY: Social History   Socioeconomic History   Marital status: Divorced    Spouse name: Not on file   Number of children: 2   Years of education: 2 yr degre   Highest education level: Not on file  Occupational History    Employer: OTHER    Comment: disability  Tobacco Use   Smoking status: Never   Smokeless tobacco: Never  Vaping Use   Vaping Use: Never used  Substance and Sexual Activity   Alcohol use: Not Currently   Drug use: No   Sexual activity: Not on file  Other Topics Concern   Not on file  Social History Narrative   Patient lives at home with her spouse.   Caffeine use: very little   Social Determinants of Radio broadcast assistant Strain: Not on file  Food Insecurity: Not on file  Transportation Needs: Not on file  Physical Activity: Not on file  Stress: Not on file  Social Connections: Not on file  Intimate Partner Violence: Not on file     PHYSICAL EXAM  GENERAL EXAM/CONSTITUTIONAL: Vitals:  Vitals:   10/02/21 1449  BP: 130/80  Pulse: 66  Weight: 137 lb (62.1 kg)  Height: 5' (1.524 m)   Body mass index is 26.76 kg/m. Wt Readings from Last 3 Encounters:  10/02/21 137 lb (62.1 kg)  11/10/20 155 lb 3.3 oz (70.4 kg)  11/17/19 166 lb 11.2 oz (75.6 kg)   Patient is in no distress; well developed, nourished and groomed; neck is supple  CARDIOVASCULAR: Examination of carotid arteries is normal; no carotid bruits Regular rate and rhythm, no murmurs Examination  of peripheral vascular system by observation and palpation is normal  EYES: Ophthalmoscopic exam of optic discs and posterior segments is normal; no papilledema or hemorrhages No results found.  MUSCULOSKELETAL: Gait, strength, tone, movements noted in Neurologic exam below  NEUROLOGIC: MENTAL STATUS:     09/30/2020   11:00 AM  MMSE - Mini Mental State Exam  Orientation to time  Orientation to Place   Registration   Attention/ Calculation   Recall   Language- name 2 objects   Language- repeat   Language- follow 3 step command   Language- read & follow direction   Write a sentence   Copy design   Total score      Information is confidential and restricted. Go to Review Flowsheets to unlock data.   awake, alert, oriented to person, place and time recent and remote memory intact normal attention and concentration language fluent, comprehension intact, naming intact fund of knowledge appropriate  CRANIAL NERVE:  2nd - no papilledema on fundoscopic exam 2nd, 3rd, 4th, 6th - pupils equal and reactive to light, visual fields full to confrontation, extraocular muscles intact, no nystagmus 5th - facial sensation symmetric 7th - facial strength symmetric 8th - hearing intact 9th - palate elevates symmetrically, uvula midline 11th - shoulder shrug symmetric 12th - tongue protrusion midline OROLINGUAL DYSKINESIAS  MOTOR:  normal bulk and tone, full strength in the BUE, BLE  SENSORY:  normal and symmetric to light touch, temperature, vibration  COORDINATION:  finger-nose-finger, fine finger movements normal  REFLEXES:  deep tendon reflexes TRACE and symmetric  GAIT/STATION:  narrow based gait     DIAGNOSTIC DATA (LABS, IMAGING, TESTING) - I reviewed patient records, labs, notes, testing and imaging myself where available.  Lab Results  Component Value Date   WBC 9.2 11/07/2020   HGB 13.6 11/07/2020   HCT 39.4 11/07/2020   MCV 97.5 11/07/2020   PLT 157  11/07/2020      Component Value Date/Time   NA 142 11/08/2020 0136   K 3.9 11/08/2020 0136   CL 109 11/08/2020 0136   CO2 25 11/08/2020 0136   GLUCOSE 98 11/08/2020 0136   BUN 18 11/08/2020 0136   CREATININE 1.87 (H) 11/08/2020 0136   CALCIUM 8.9 11/08/2020 0136   PROT 5.3 (L) 11/08/2020 0136   PROT 7.2 11/14/2013 1306   ALBUMIN 2.6 (L) 11/08/2020 0136   AST 73 (H) 11/08/2020 0136   ALT 36 11/08/2020 0136   ALKPHOS 49 11/08/2020 0136   BILITOT 0.8 11/08/2020 0136   GFRNONAA 29 (L) 11/08/2020 0136   GFRAA 32 (L) 11/17/2019 1456   Lab Results  Component Value Date   CHOL 130 11/06/2020   HDL 35 (L) 11/06/2020   LDLCALC 72 11/06/2020   TRIG 117 11/06/2020   CHOLHDL 3.7 11/06/2020   Lab Results  Component Value Date   HGBA1C 6.0 (H) 11/06/2020   Lab Results  Component Value Date   VITAMINB12 >1999 (H) 11/14/2013   Lab Results  Component Value Date   TSH 1.245 11/06/2020       ASSESSMENT AND PLAN  66 y.o. year old female here with:   Dx:  1. Tardive dyskinesia       PLAN:  TARDIVE DYSKINESIA (prior vraylar, caplyta) - treatment resistant (failed austedo and ingrezza) - consider to wean off ingrezza; then start tetrabenazine 12.5mg  daily; then increase to 12.5mg  twice a day after 1-2 weeks; then increase to 12.5mg  three times a day - consider to change xanax to clonazepam (which may help TD before bedtime) - will request psychiatry to initiate these meds as they are managing her other meds, and patient also has pill pack program - consider referral to academic center for consideration of DBS  Return for pending if symptoms worsen or fail to improve.    Penni Bombard, MD 2/64/1583, 0:94 PM Certified in Neurology, Neurophysiology and Neuroimaging  [facility] [address] [place], [place] [address] [phone]

## 2021-10-04 IMAGING — US US RENAL
1 series · 14 of 25 positions shown · non-contrast
Comparison: Renal ultrasound 06/29/2014.

CLINICAL DATA: Acute kidney injury.

EXAM:
RENAL / URINARY TRACT ULTRASOUND COMPLETE

[Series 1: us renal · 14 of 30 slices shown]
[im 1/30]
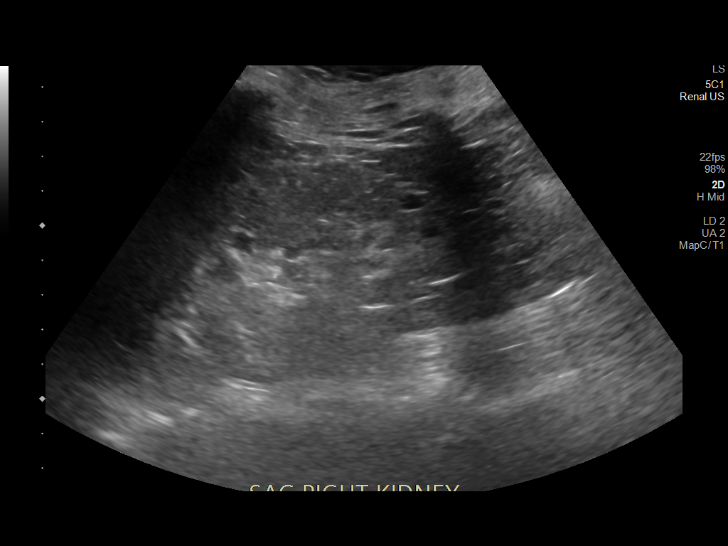
[im 3/30]
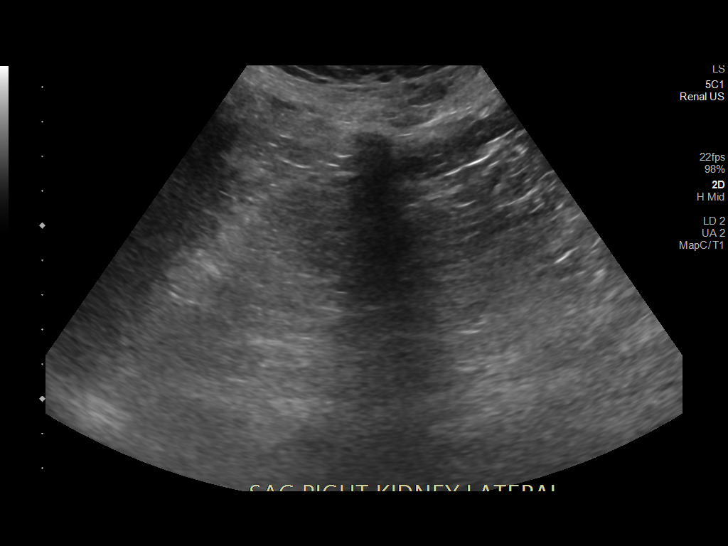
[im 5/30]
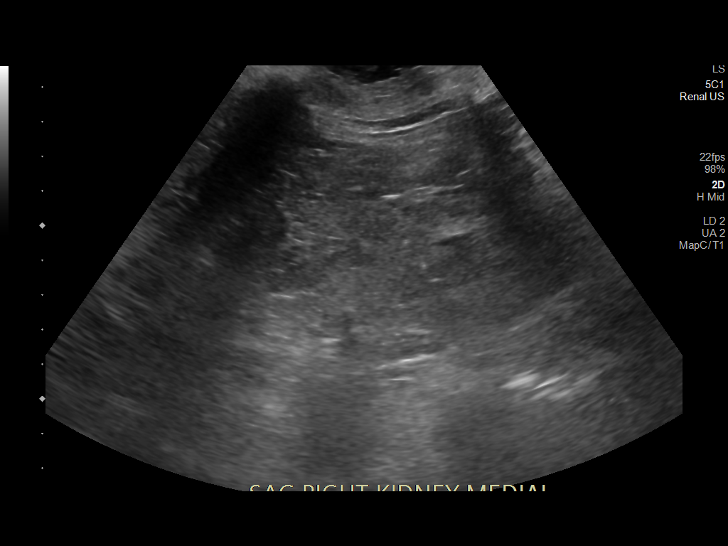
[im 8/30]
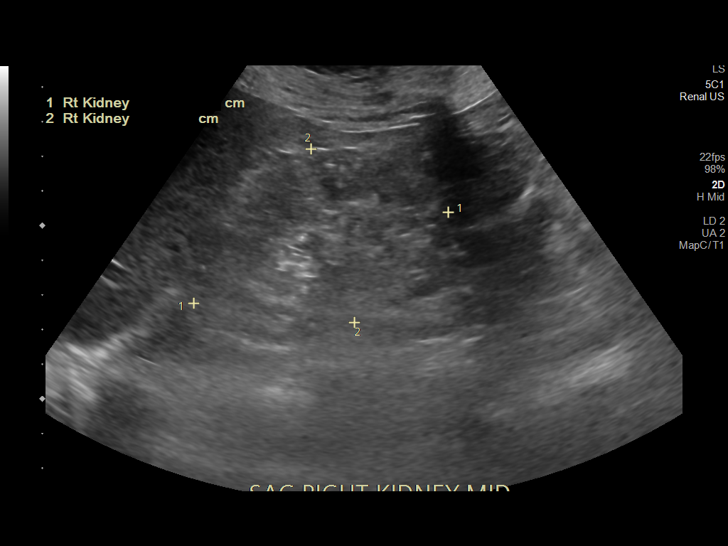
[im 10/30]
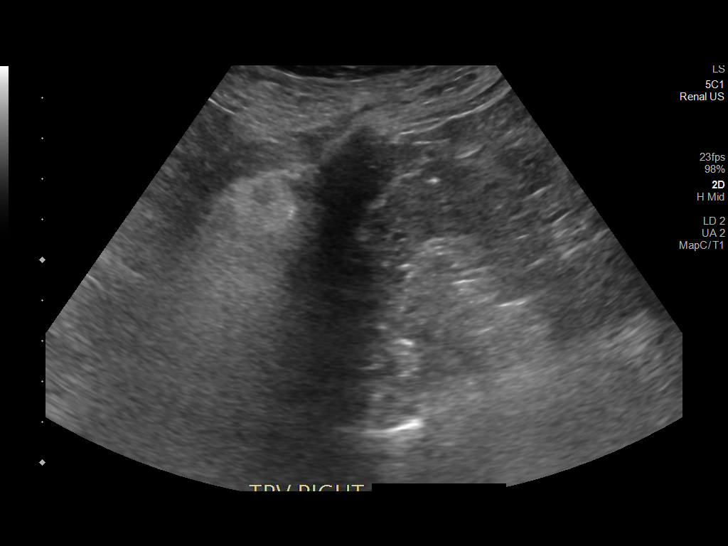
[im 11/30]
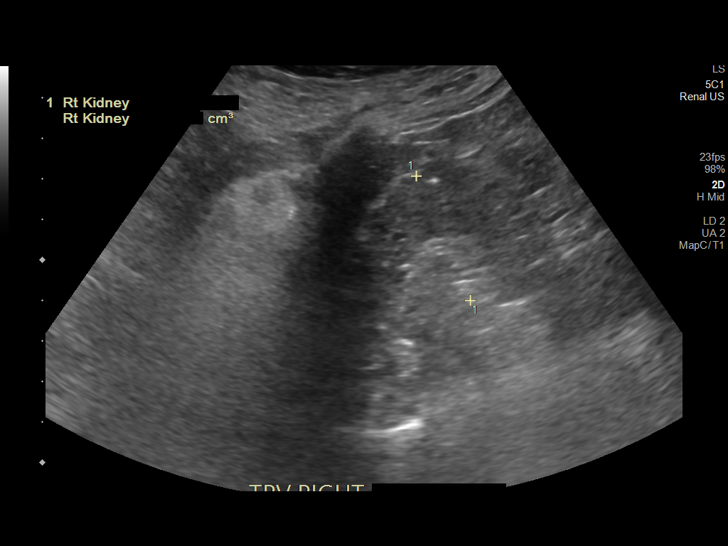
[im 14/30]
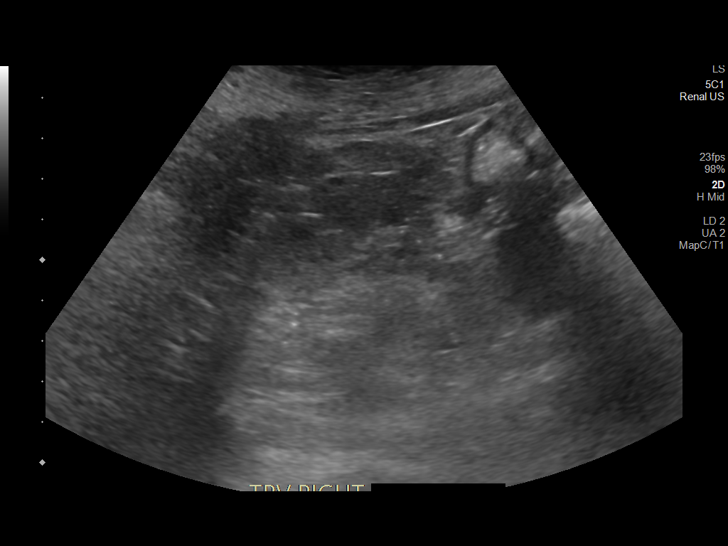
[im 16/30]
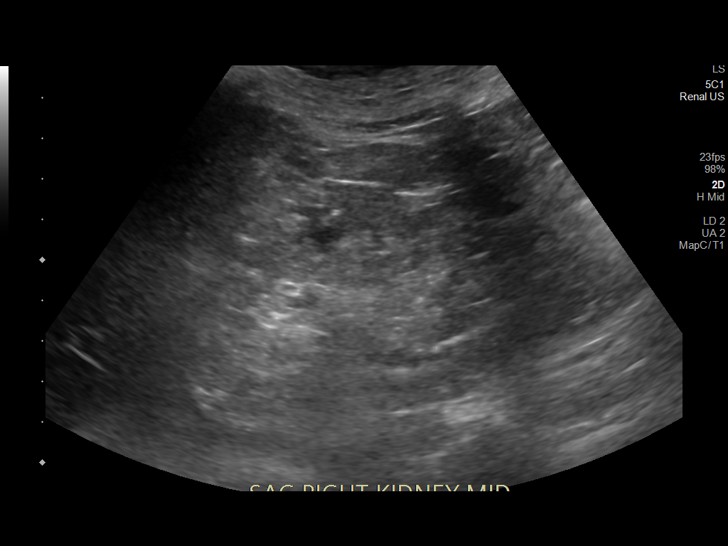
[im 19/30]
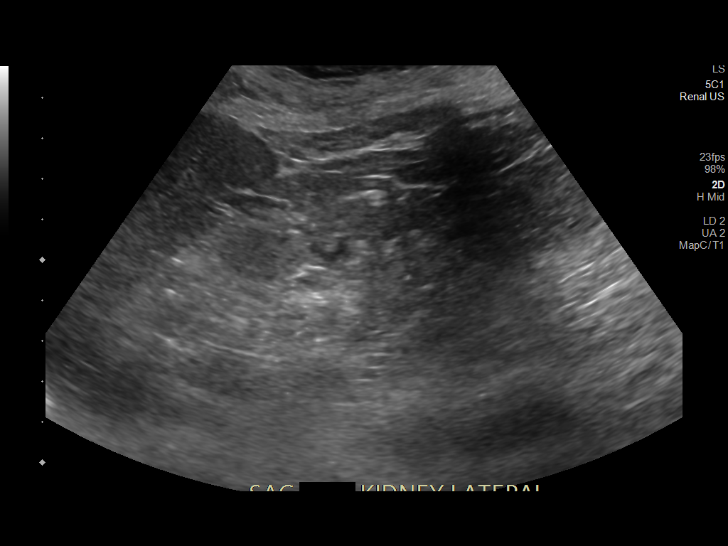
[im 20/30]
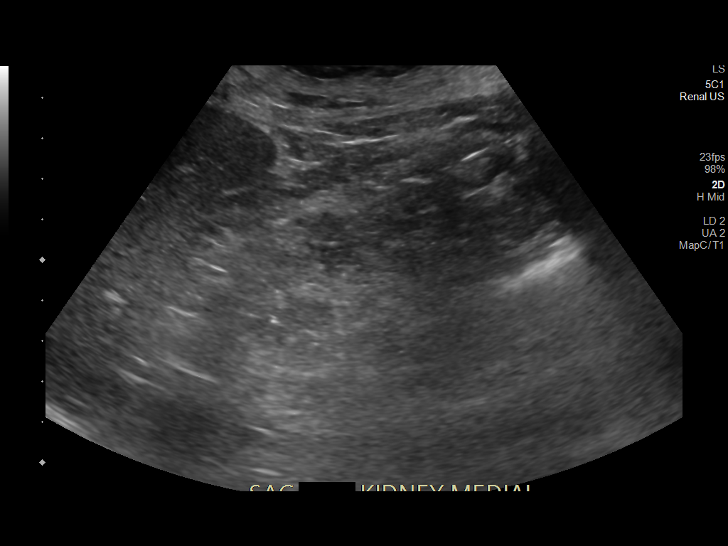
[im 22/30]
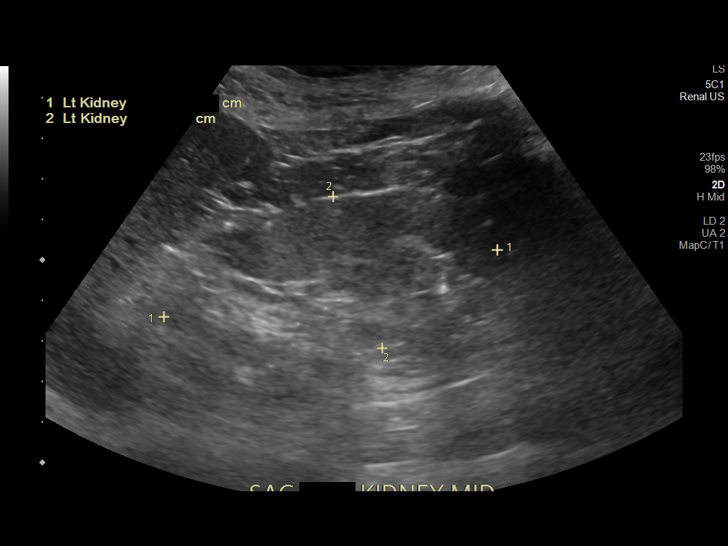
[im 25/30]
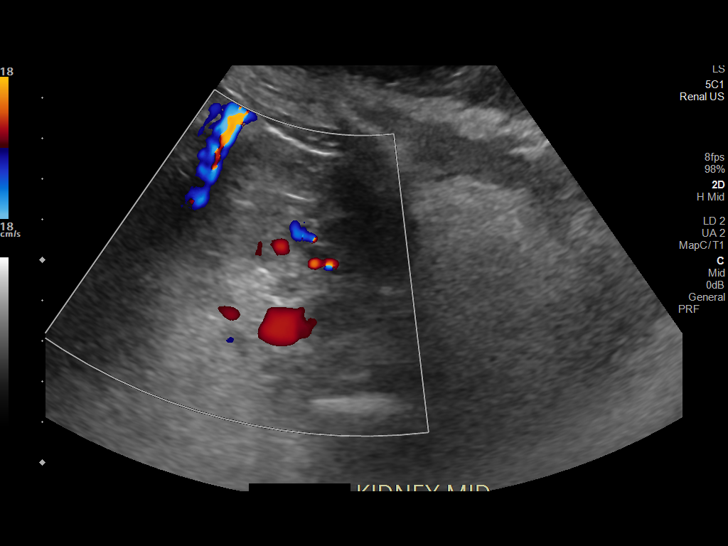
[im 27/30]
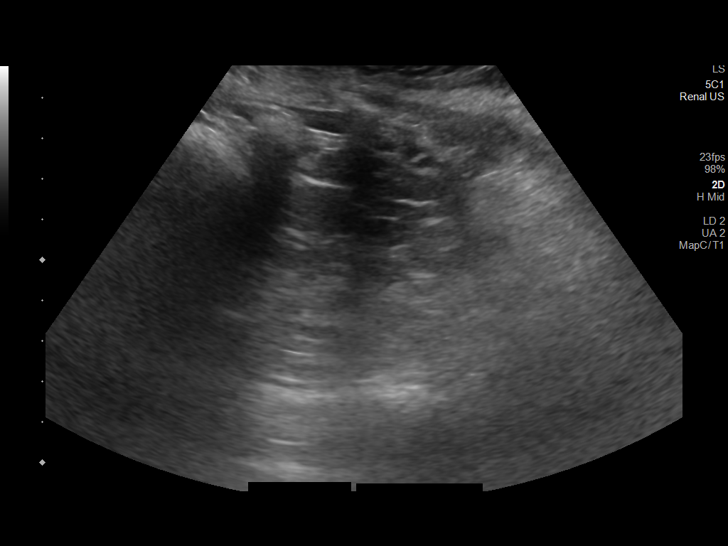
[im 30/30]
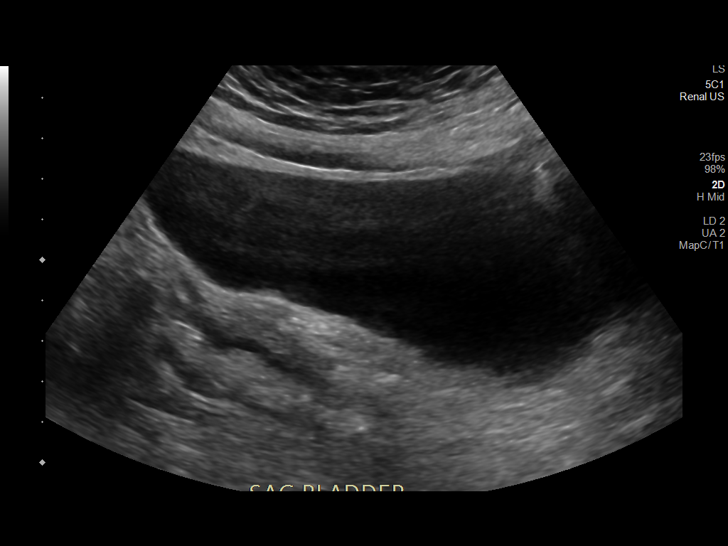

[14 of 25 positions shown; findings below may reference images not displayed]

FINDINGS: Right Kidney:

Renal measurements: 7.8 x 5.2 x 3.4 cm = volume: 71 mL. Diffusely
marked increased parenchymal echogenicity. There is no
hydronephrosis. Cyst seen on prior exam is not well visualized
currently.

Left Kidney:

Renal measurements: 8.4 x 3.9 x 3.2 cm = volume: 55 mL. Diffusely
marked increased parenchymal echogenicity. There is no
hydronephrosis. No obvious focal renal abnormality.

Bladder:

Appears normal for degree of bladder distention.

Other:

None.
IMPRESSION: 1. Diffusely increased parenchymal echogenicity consistent with
chronic medical renal disease. Kidneys are markedly echogenic.
2. No obstructive uropathy.

## 2022-03-08 ENCOUNTER — Emergency Department (HOSPITAL_COMMUNITY): Payer: Medicare Other

## 2022-03-08 ENCOUNTER — Other Ambulatory Visit: Payer: Self-pay

## 2022-03-08 ENCOUNTER — Emergency Department (HOSPITAL_COMMUNITY)
Admission: EM | Admit: 2022-03-08 | Discharge: 2022-03-09 | Disposition: A | Discharge status: Home / Self Care | Payer: Medicare Other | Attending: Emergency Medicine | Admitting: Emergency Medicine

## 2022-03-08 DIAGNOSIS — N189 Chronic kidney disease, unspecified: Secondary | ICD-10-CM | POA: Diagnosis not present

## 2022-03-08 DIAGNOSIS — E1122 Type 2 diabetes mellitus with diabetic chronic kidney disease: Secondary | ICD-10-CM | POA: Insufficient documentation

## 2022-03-08 DIAGNOSIS — E876 Hypokalemia: Secondary | ICD-10-CM | POA: Diagnosis not present

## 2022-03-08 DIAGNOSIS — D72829 Elevated white blood cell count, unspecified: Secondary | ICD-10-CM | POA: Insufficient documentation

## 2022-03-08 DIAGNOSIS — I129 Hypertensive chronic kidney disease with stage 1 through stage 4 chronic kidney disease, or unspecified chronic kidney disease: Secondary | ICD-10-CM | POA: Diagnosis not present

## 2022-03-08 DIAGNOSIS — R410 Disorientation, unspecified: Secondary | ICD-10-CM | POA: Diagnosis not present

## 2022-03-08 DIAGNOSIS — E860 Dehydration: Secondary | ICD-10-CM | POA: Diagnosis not present

## 2022-03-08 DIAGNOSIS — Z7982 Long term (current) use of aspirin: Secondary | ICD-10-CM | POA: Diagnosis not present

## 2022-03-08 DIAGNOSIS — R4182 Altered mental status, unspecified: Secondary | ICD-10-CM | POA: Diagnosis present

## 2022-03-08 DIAGNOSIS — Z79899 Other long term (current) drug therapy: Secondary | ICD-10-CM | POA: Diagnosis not present

## 2022-03-08 LAB — CBC WITH DIFFERENTIAL/PLATELET
Abs Immature Granulocytes: 0.05 10*3/uL (ref 0.00–0.07)
Basophils Absolute: 0.1 10*3/uL (ref 0.0–0.1)
Basophils Relative: 1 %
Eosinophils Absolute: 0.2 10*3/uL (ref 0.0–0.5)
Eosinophils Relative: 1 %
HCT: 48.1 % — ABNORMAL HIGH (ref 36.0–46.0)
Hemoglobin: 15.7 g/dL — ABNORMAL HIGH (ref 12.0–15.0)
Immature Granulocytes: 0 %
Lymphocytes Relative: 15 %
Lymphs Abs: 1.7 10*3/uL (ref 0.7–4.0)
MCH: 32.4 pg (ref 26.0–34.0)
MCHC: 32.6 g/dL (ref 30.0–36.0)
MCV: 99.4 fL (ref 80.0–100.0)
Monocytes Absolute: 1.1 10*3/uL — ABNORMAL HIGH (ref 0.1–1.0)
Monocytes Relative: 9 %
Neutro Abs: 8.6 10*3/uL — ABNORMAL HIGH (ref 1.7–7.7)
Neutrophils Relative %: 74 %
Platelets: 180 10*3/uL (ref 150–400)
RBC: 4.84 MIL/uL (ref 3.87–5.11)
RDW: 12 % (ref 11.5–15.5)
WBC: 11.8 10*3/uL — ABNORMAL HIGH (ref 4.0–10.5)
nRBC: 0 % (ref 0.0–0.2)

## 2022-03-08 LAB — COMPREHENSIVE METABOLIC PANEL
ALT: 22 U/L (ref 0–44)
AST: 27 U/L (ref 15–41)
Albumin: 4.4 g/dL (ref 3.5–5.0)
Alkaline Phosphatase: 79 U/L (ref 38–126)
Anion gap: 17 — ABNORMAL HIGH (ref 5–15)
BUN: 20 mg/dL (ref 8–23)
CO2: 18 mmol/L — ABNORMAL LOW (ref 22–32)
Calcium: 10.3 mg/dL (ref 8.9–10.3)
Chloride: 107 mmol/L (ref 98–111)
Creatinine, Ser: 2.2 mg/dL — ABNORMAL HIGH (ref 0.44–1.00)
GFR, Estimated: 24 mL/min — ABNORMAL LOW (ref >60)
Glucose, Bld: 104 mg/dL — ABNORMAL HIGH (ref 70–99)
Potassium: 4.2 mmol/L (ref 3.5–5.1)
Sodium: 142 mmol/L (ref 135–145)
Total Bilirubin: 1 mg/dL (ref 0.3–1.2)
Total Protein: 7.9 g/dL (ref 6.5–8.1)

## 2022-03-08 LAB — CBG MONITORING, ED: Glucose-Capillary: 104 mg/dL — ABNORMAL HIGH (ref 70–99)

## 2022-03-08 NOTE — ED Triage Notes (Signed)
Pt arrives from home via [facility] with c/o Anxiety, sob, dizziness, confusion. Clear lung sounds. A/O x3 166/98, hr 110, 98%. Hx of HTN, DM. CBG 166. Concerns for abuse of her xanax.

## 2022-03-08 NOTE — ED Notes (Signed)
I spoke with Denise Serrano and updated him on pt current status and that she is waiting to be placed in a treatment room. Pt given water and encouraged oral hydration.

## 2022-03-08 NOTE — ED Triage Notes (Signed)
Pt states she is unsure why she is here, she does report some issues with constipation. She is oriented to person and place only. Dry mouth, anxious. She can only tell me she took "two xanax" but unable to recall when she last took them.

## 2022-03-08 NOTE — ED Provider Triage Note (Signed)
Emergency Medicine Provider Triage Evaluation Note  Tura Roller , a 66 y.o. female  was evaluated in triage.  Pt complains of altered mental status.  Oriented to self and place only.  Unclear onset of symptoms.  Patient's family member called EMS due to concern patient taking too much Xanax lately.  Patient tells me she has not used the bathroom today, not having any abdominal pain or vomiting.  No previous abdominal surgeries.  Thinks the year is 2015, unsure how she got here.  No lateralized weakness numbness, headache, chest pain.  Review of Systems  Per HPI  Physical Exam  There were no vitals taken for this visit. Gen:   Awake, no distress   Resp:  Normal effort  MSK:   Moves extremities without difficulty  Other:  Oriented x 2.  Lingual dyskinesia.  Otherwise cranial nerves are normal, upper and lower extremity strength is symmetric bilaterally no neglect.  Follows commands.  Normal finger-nose.  Medical Decision Making  Medically screening exam initiated at 9:47 PM.  Appropriate orders placed.  Kimbra Woolsey was informed that the remainder of the evaluation will be completed by another provider, this initial triage assessment does not replace that evaluation, and the importance of remaining in the ED until their evaluation is complete.     Sherrill Raring, PA-C 03/08/22 2150

## 2022-03-08 NOTE — ED Notes (Signed)
Marny Lowenstein is contact [phone].

## 2022-03-09 LAB — BASIC METABOLIC PANEL
Anion gap: 5 (ref 5–15)
Anion gap: 7 (ref 5–15)
BUN: 17 mg/dL (ref 8–23)
BUN: 21 mg/dL (ref 8–23)
CO2: 20 mmol/L — ABNORMAL LOW (ref 22–32)
CO2: 24 mmol/L (ref 22–32)
Calcium: 7.1 mg/dL — ABNORMAL LOW (ref 8.9–10.3)
Calcium: 9.1 mg/dL (ref 8.9–10.3)
Chloride: 115 mmol/L — ABNORMAL HIGH (ref 98–111)
Chloride: 109 mmol/L (ref 98–111)
Creatinine, Ser: 1.67 mg/dL — ABNORMAL HIGH (ref 0.44–1.00)
Creatinine, Ser: 2.02 mg/dL — ABNORMAL HIGH (ref 0.44–1.00)
GFR, Estimated: 33 mL/min — ABNORMAL LOW (ref >60)
GFR, Estimated: 27 mL/min — ABNORMAL LOW (ref >60)
Glucose, Bld: 99 mg/dL (ref 70–99)
Glucose, Bld: 111 mg/dL — ABNORMAL HIGH (ref 70–99)
Potassium: 2.5 mmol/L — CL (ref 3.5–5.1)
Potassium: 3.3 mmol/L — ABNORMAL LOW (ref 3.5–5.1)
Sodium: 140 mmol/L (ref 135–145)
Sodium: 140 mmol/L (ref 135–145)

## 2022-03-09 LAB — URINALYSIS, ROUTINE W REFLEX MICROSCOPIC
Bilirubin Urine: NEGATIVE
Glucose, UA: NEGATIVE mg/dL
Hgb urine dipstick: NEGATIVE
Ketones, ur: 5 mg/dL — AB
Leukocytes,Ua: NEGATIVE
Nitrite: NEGATIVE
Protein, ur: NEGATIVE mg/dL
Specific Gravity, Urine: 1.005 (ref 1.005–1.030)
pH: 8 (ref 5.0–8.0)

## 2022-03-09 LAB — BLOOD GAS, VENOUS
Acid-Base Excess: 5.1 mmol/L — ABNORMAL HIGH (ref 0.0–2.0)
Bicarbonate: 27.6 mmol/L (ref 20.0–28.0)
O2 Saturation: 37.4 %
Patient temperature: 37
pCO2, Ven: 33 mmHg — ABNORMAL LOW (ref 44–60)
pH, Ven: 7.53 — ABNORMAL HIGH (ref 7.25–7.43)
pO2, Ven: <31 mmHg — CL (ref 32–45)

## 2022-03-09 LAB — RAPID URINE DRUG SCREEN, HOSP PERFORMED
Amphetamines: NOT DETECTED
Barbiturates: NOT DETECTED
Benzodiazepines: POSITIVE — AB
Cocaine: NOT DETECTED
Opiates: NOT DETECTED
Tetrahydrocannabinol: NOT DETECTED

## 2022-03-09 LAB — ETHANOL: Alcohol, Ethyl (B): <10 mg/dL (ref <10)

## 2022-03-09 LAB — SALICYLATE LEVEL: Salicylate Lvl: <7 mg/dL — ABNORMAL LOW (ref 7.0–30.0)

## 2022-03-09 MED ORDER — LEVOTHYROXINE SODIUM 50 MCG PO TABS: 50.0000 ug | ORAL_TABLET | Freq: Every day | ORAL | Status: DC

## 2022-03-09 MED ORDER — GABAPENTIN 300 MG PO CAPS: 300.0000 mg | ORAL_CAPSULE | Freq: Every day | ORAL | Status: DC

## 2022-03-09 MED ORDER — ALPRAZOLAM 0.5 MG PO TABS
0.5000 mg | ORAL_TABLET | Freq: Two times a day (BID) | ORAL | Status: DC | PRN
  Filled 2022-03-09: qty 1

## 2022-03-09 MED ORDER — MIRABEGRON ER 25 MG PO TB24
25.0000 mg | ORAL_TABLET | Freq: Every day | ORAL | Status: DC
  Administered 2022-03-09: 25 mg via ORAL
  Filled 2022-03-09: qty 1

## 2022-03-09 MED ORDER — ALBUTEROL SULFATE (2.5 MG/3ML) 0.083% IN NEBU: 2.5000 mg | INHALATION_SOLUTION | RESPIRATORY_TRACT | Status: DC | PRN

## 2022-03-09 MED ORDER — ATORVASTATIN CALCIUM 10 MG PO TABS
20.0000 mg | ORAL_TABLET | Freq: Every day | ORAL | Status: DC
  Administered 2022-03-09: 20 mg via ORAL
  Filled 2022-03-09: qty 2

## 2022-03-09 MED ORDER — GABAPENTIN 300 MG PO CAPS
300.0000 mg | ORAL_CAPSULE | Freq: Two times a day (BID) | ORAL | Status: DC
  Administered 2022-03-09: 300 mg via ORAL
  Filled 2022-03-09: qty 1

## 2022-03-09 MED ORDER — SODIUM CHLORIDE 0.9 % IV BOLUS
1000.0000 mL | Freq: Once | INTRAVENOUS | Status: AC
  Administered 2022-03-09: 1000 mL via INTRAVENOUS

## 2022-03-09 MED ORDER — BUPROPION HCL ER (XL) 150 MG PO TB24
300.0000 mg | ORAL_TABLET | Freq: Every day | ORAL | Status: DC
  Administered 2022-03-09: 300 mg via ORAL
  Filled 2022-03-09: qty 2

## 2022-03-09 MED ORDER — CARVEDILOL 12.5 MG PO TABS
12.5000 mg | ORAL_TABLET | Freq: Two times a day (BID) | ORAL | Status: DC
  Administered 2022-03-09 (×2): 12.5 mg via ORAL
  Filled 2022-03-09 (×2): qty 1

## 2022-03-09 NOTE — ED Provider Notes (Signed)
Emergency Medicine Observation Re-evaluation Note  Denise Serrano is a 66 y.o. female, seen on rounds today.  Pt initially presented to the ED for complaints of Altered Mental Status Currently, the patient is stable  Patient took 2 xanax day that her bf got admitted and presented to the ED with AMS.  No specific cause besides benzo found and patient appears to be at baseline.  Physical Exam  BP (!) 151/95 (BP Location: Right Arm)   Pulse 77   Temp 98.4 F (36.9 C) (Oral)   Resp 15   SpO2 99%  Physical Exam General: WDWN female Cardiac: rrr Lungs: cta Psych: alert and speaking  ED Course / MDM  EKG:EKG Interpretation  Date/Time:  Monday March 09 2022 01:17:49 EST Ventricular Rate:  78 PR Interval:  180 QRS Duration: 80 QT Interval:  413 QTC Calculation: 471 R Axis:   68 Text Interpretation: Sinus rhythm No significant change was found Confirmed by Addison Lank [phone]) on 03/09/2022 1:49:39 AM  I have reviewed the labs performed to date as well as medications administered while in observation.  Recent changes in the last 24 hours include none.  Plan  Current plan is for patient is medically cleared at baseline. Discussed care with daugher, Denise Serrano who is in [place] work is attempting placement and following up with bf re when he is likely to be d/c'd.    Pattricia Boss, MD 03/09/22 [phone]

## 2022-03-09 NOTE — ED Provider Notes (Signed)
[place] DEPT Provider Note   CSN: [id number] Arrival date & time: 03/08/22  2117     History  Chief Complaint  Patient presents with   Altered Mental Status    Denise Serrano is a 66 y.o. female.  HPI   Patient with medical history including bipolar, diabetes, CKD, present presents with altered mental status.  Patient initially told triage that she is not sure why she is here.  When I spoke with the patient she states that she is here because her partner is in the hospital and she want to see him.  She states that she currently has no complaints at this time, she endorses that she took 2 of her Xanax's earlier today but does not remember what time she took it.  She is not endorsing any headaches change in vision paresthesias or weakness the upper or lower extremities she denies any chest pain abdominal pain, no recent falls, she does not endorse any suicidal homicidal ideations.  I reviewed patient's chart she has been seen in the past for falls/altered mental status, at baseline per patient partner is confused and has trouble completing ADLs, patient takes Xanax daily for her anxiety.   Home Medications Prior to Admission medications   Medication Sig Start Date End Date Taking? Authorizing Provider  albuterol (PROVENTIL) (2.5 MG/3ML) 0.083% nebulizer solution Take 3 mLs (2.5 mg total) by nebulization every 4 (four) hours as needed for wheezing or shortness of breath. 07/01/14   Janece Canterbury, MD  ALPRAZolam (XANAX) 0.5 MG tablet TAKE 1 TABLET BY MOUTH FOUR TIMES A DAY AS NEEDED    [provider]  amLODipine (NORVASC) 5 MG tablet Take 1 tablet (5 mg total) by mouth daily. 11/13/20 10/02/21  Lajean Manes, MD  aspirin EC 81 MG tablet Take 81 mg by mouth daily.    [provider]  atorvastatin (LIPITOR) 20 MG tablet Take 20 mg by mouth daily. 03/25/16   [provider]  budesonide-formoterol (SYMBICORT) 80-4.5 MCG/ACT inhaler  Inhale 2 puffs into the lungs 2 (two) times daily as needed (For shortness of breath).    [provider]  buPROPion (WELLBUTRIN XL) 300 MG 24 hr tablet Take 300 mg by mouth daily.    [provider]  cariprazine (VRAYLAR) 3 MG capsule Take by mouth daily. PM    [provider]  carvedilol (COREG) 12.5 MG tablet Take 12.5 mg by mouth 2 (two) times daily with a meal.    [provider]  cetirizine (ZYRTEC) 10 MG tablet Take 10 mg by mouth daily.    [provider]  fesoterodine (TOVIAZ) 4 MG TB24 tablet Take 4 mg by mouth daily.    [provider]  gabapentin (NEURONTIN) 300 MG capsule Take 300 mg by mouth daily. 03/25/16   [provider]  levothyroxine (SYNTHROID, LEVOTHROID) 50 MCG tablet Take 50 mcg by mouth daily. 03/25/16   [provider]  lumateperone tosylate (CAPLYTA) 42 MG capsule Take 42 mg by mouth daily.    [provider]  mirabegron ER (MYRBETRIQ) 25 MG TB24 tablet Take 25 mg by mouth daily. am    [provider]  montelukast (SINGULAIR) 5 MG chewable tablet Chew 5 mg by mouth daily. am    [provider]  Multiple Vitamin (MULTIVITAMIN WITH MINERALS) TABS tablet Take 1 tablet by mouth 2 (two) times daily.    [provider]  traZODone (DESYREL) 50 MG tablet Take 50 mg by mouth at bedtime  as needed for sleep. 1-3 as needed for sleep    [provider]  valACYclovir (VALTREX) 500 MG tablet Take 500 mg by mouth 2 (two) times daily. As needed    [provider]  valbenazine (INGREZZA) 80 MG capsule 80 mg at bedtime.    [provider]      Allergies    Amoxicillin, Iodine, and Penicillin g    Review of Systems   Review of Systems  Constitutional:  Negative for chills and fever.  Respiratory:  Negative for shortness of breath.   Cardiovascular:  Negative for chest pain.  Gastrointestinal:  Negative for abdominal pain.  Neurological:  Negative for  headaches.    Physical Exam Updated Vital Signs BP 123/75   Pulse 70   Temp 98.1 F (36.7 C) (Oral)   Resp (!) 22   SpO2 99%  Physical Exam Vitals and nursing note reviewed.  Constitutional:      General: She is not in acute distress.    Appearance: She is not ill-appearing.  HENT:     Head: Normocephalic and atraumatic.     Comments: There is no deformity of the head present no raccoon eyes or Battle sign noted.    Nose: No congestion.     Mouth/Throat:     Mouth: Mucous membranes are dry.     Pharynx: Oropharynx is clear.     Comments: No trismus no torticollis no oral trauma Eyes:     Extraocular Movements: Extraocular movements intact.     Conjunctiva/sclera: Conjunctivae normal.     Pupils: Pupils are equal, round, and reactive to light.  Cardiovascular:     Rate and Rhythm: Normal rate and regular rhythm.     Pulses: Normal pulses.     Heart sounds: No murmur heard.    No friction rub. No gallop.  Pulmonary:     Effort: No respiratory distress.     Breath sounds: No wheezing, rhonchi or rales.  Abdominal:     Palpations: Abdomen is soft.     Tenderness: There is no abdominal tenderness. There is no right CVA tenderness or left CVA tenderness.  Musculoskeletal:     Comments: Spine was palpated was nontender to palpation no step-off deformities noted no pelvis instability no leg shortening.  She is moving her upper and lower extremities without issue  Skin:    General: Skin is warm and dry.  Neurological:     Mental Status: She is alert.     GCS: GCS eye subscore is 4. GCS verbal subscore is 5. GCS motor subscore is 6.     Cranial Nerves: Cranial nerves 2-12 are intact. No cranial nerve deficit.     Sensory: Sensation is intact.     Motor: No weakness.     Coordination: Romberg sign negative. Finger-Nose-Finger Test normal.     Gait: Gait is intact.     Comments: Patient is alert and orient x 4, there is no facial asymmetry no difficulty with word finding she is  following two-step commands there is no unilateral weakness present, gait is fully intact.  Psychiatric:        Mood and Affect: Mood normal.     Comments: Patient has noted tardive dyskinesia, she has some upper body tremors, she appears to be slightly anxious during my examination, but does not appear to be responding to internal stimuli, she does not endorse any suicidal homicidal ideation she is responding appropriate to all questions.     ED  Results / Procedures / Treatments   Labs (all labs ordered are listed, but only abnormal results are displayed) Labs Reviewed  URINALYSIS, ROUTINE W REFLEX MICROSCOPIC - Abnormal; Notable for the following components:      Result Value   Color, Urine STRAW (*)    Ketones, ur 5 (*)    All other components within normal limits  RAPID URINE DRUG SCREEN, HOSP PERFORMED - Abnormal; Notable for the following components:   Benzodiazepines POSITIVE (*)    All other components within normal limits  CBC WITH DIFFERENTIAL/PLATELET - Abnormal; Notable for the following components:   WBC 11.8 (*)    Hemoglobin 15.7 (*)    HCT 48.1 (*)    Neutro Abs 8.6 (*)    Monocytes Absolute 1.1 (*)    All other components within normal limits  COMPREHENSIVE METABOLIC PANEL - Abnormal; Notable for the following components:   CO2 18 (*)    Glucose, Bld 104 (*)    Creatinine, Ser 2.20 (*)    GFR, Estimated 24 (*)    Anion gap 17 (*)    All other components within normal limits  SALICYLATE LEVEL - Abnormal; Notable for the following components:   Salicylate Lvl <1.1 (*)    All other components within normal limits  BLOOD GAS, VENOUS - Abnormal; Notable for the following components:   pH, Ven 7.53 (*)    pCO2, Ven 33 (*)    pO2, Ven <31 (*)    Acid-Base Excess 5.1 (*)    All other components within normal limits  BASIC METABOLIC PANEL - Abnormal; Notable for the following components:   Potassium 2.5 (*)    Chloride 115 (*)    CO2 20 (*)    Creatinine, Ser  1.67 (*)    Calcium 7.1 (*)    GFR, Estimated 33 (*)    All other components within normal limits  BASIC METABOLIC PANEL - Abnormal; Notable for the following components:   Potassium 3.3 (*)    Glucose, Bld 111 (*)    Creatinine, Ser 2.02 (*)    GFR, Estimated 27 (*)    All other components within normal limits  CBG MONITORING, ED - Abnormal; Notable for the following components:   Glucose-Capillary 104 (*)    All other components within normal limits  ETHANOL    EKG EKG Interpretation  Date/Time:  Monday March 09 2022 01:17:49 EST Ventricular Rate:  78 PR Interval:  180 QRS Duration: 80 QT Interval:  413 QTC Calculation: 471 R Axis:   68 Text Interpretation: Sinus rhythm No significant change was found Confirmed by Addison Lank [phone]) on 03/09/2022 1:49:39 AM  Radiology CT Head Wo Contrast  Result Date: 03/08/2022 CLINICAL DATA:  Dizziness, confusion EXAM: CT HEAD WITHOUT CONTRAST TECHNIQUE: Contiguous axial images were obtained from the base of the skull through the vertex without intravenous contrast. RADIATION DOSE REDUCTION: This exam was performed according to the departmental dose-optimization program which includes automated exposure control, adjustment of the mA and/or kV according to patient size and/or use of iterative reconstruction technique. COMPARISON:  11/05/2020 FINDINGS: Brain: No evidence of acute infarction, hemorrhage, hydrocephalus, extra-axial collection or mass lesion/mass effect. Vascular: No hyperdense vessel or unexpected calcification. Skull: Normal. Negative for fracture or focal lesion. Sinuses/Orbits: The visualized paranasal sinuses are essentially clear. The mastoid air cells are unopacified. Other: None. IMPRESSION: Normal head CT. Electronically Signed   By: Julian Hy M.D.   On: 03/08/2022 22:23   DG Chest 2 View  Result Date: 03/08/2022 CLINICAL DATA:  Shortness of breath EXAM: CHEST - 2 VIEW COMPARISON:  Chest x-ray 08/22/2015  FINDINGS: The heart size and mediastinal contours are within normal limits. Both lungs are clear. The visualized skeletal structures are unremarkable. IMPRESSION: No active cardiopulmonary disease. Electronically Signed   By: Ronney Asters M.D.   On: 03/08/2022 22:15    Procedures Procedures    Medications Ordered in ED Medications  ALPRAZolam (XANAX) tablet 0.5 mg (has no administration in time range)  albuterol (PROVENTIL) (2.5 MG/3ML) 0.083% nebulizer solution 2.5 mg (has no administration in time range)  atorvastatin (LIPITOR) tablet 20 mg (has no administration in time range)  buPROPion (WELLBUTRIN XL) 24 hr tablet 300 mg (has no administration in time range)  carvedilol (COREG) tablet 12.5 mg (has no administration in time range)  gabapentin (NEURONTIN) capsule 300 mg (has no administration in time range)  levothyroxine (SYNTHROID) tablet 50 mcg (has no administration in time range)  mirabegron ER (MYRBETRIQ) tablet 25 mg (has no administration in time range)  sodium chloride 0.9 % bolus 1,000 mL (1,000 mLs Intravenous New Bag/Given 03/09/22 0116)    ED Course/ Medical Decision Making/ A&P                           Medical Decision Making Amount and/or Complexity of Data Reviewed Labs: ordered.  Risk Prescription drug management.   This patient presents to the ED for concern of altered mental status, this involves an extensive number of treatment options, and is a complaint that carries with it a high risk of complications and morbidity.  The differential diagnosis includes metabolic derailment, polypharmacy, CVA, sepsis    Additional history obtained:  Additional history obtained from significant other External records from outside source obtained and reviewed including psychiatry notes, recent hospitalization   Co morbidities that complicate the patient evaluation  Bipolar  Social Determinants of Health:  Geriatric    Lab Tests:  I Ordered, and personally  interpreted labs.  The pertinent results include: CBC shows slight leukocytosis of 11.8, CMP shows CO2 of 18, glucose 104, creatinine 2.2, GFR 24 anion gap 17, UA is unremarkable, rapid urine drug screen positive for benzos   Imaging Studies ordered:  I ordered imaging studies including CT head, chest x-ray I independently visualized and interpreted imaging which showed both negative for acute findings I agree with the radiologist interpretation   Cardiac Monitoring:  The patient was maintained on a cardiac monitor.  I personally viewed and interpreted the cardiac monitored which showed an underlying rhythm of: Negative for signs of ischemia   Medicines ordered and prescription drug management:  I ordered medication including fluids I have reviewed the patients home medicines and have made adjustments as needed  Critical Interventions:  N/A   Reevaluation:  Presents with altered mental status, triage obtain basic lab work imaging which I personally reviewed, lab work is seems consistent with dehydration this correlates with my physical exam as she appears dry, will provide her with fluids, but will also add on additional lab work like ethanol, salicylate level and VBG for further evaluation.  Patient is reassessed she is resting comfortably, having no complaints, at the provide with a liter of fluids will reassess BMP to see if we corrected the anion gap which I suspect is likely from dehydration.  On repeat BMP shows she has a new hypokalemia and low calcium, spoke with the nurse lab work was drawn from the line,  possible this is delusionary, will obtain repeat the a straight stick to confirm lab work.  Repeat BMP after straight stick reveals slight hypokalemia of 3.3, resolution of decreased CO2, creatinine at 2.02 GFR 27 and resolution of anion gap, I suspect the second 1 was erroneous as it was drawn from the line.  I attempted multiple times to get in contact with patient's  daughter without success, I did speak with patient's neighbor Marny Lowenstein who informs me that patient patient is normally in bed and her partner mainly takes care of her.      Consultations Obtained:  N/a    Test Considered:  N/a    Rule out Suspicion intracranial head bleed or mass is low at this time as CT imaging is negative.  I doubt CVA as there is no focal deficits present my exam.  Initially patient was confused at triage but on my evaluation she is alert and orient x 4, she seems slightly anxious but is responding appropriately, after reviewing her chart it appears that at baseline she is confused and is taken care of her by her partner.  Low suspicion for sepsis is low at this time as she is nontoxic-appearing, vital signs are reassuring, she does have slight leukocytosis but I suspect this more acute phase reactant.  I have low suspicion for metabolic derailment as lab work is unremarkable, the initial low CO2 as well as anion gap have resolved with fluid resuscitation.    Dispostion and problem list  Patient is medically clear but unfortunately is not safe to go home as she is unable to care for self and her primary caregiver is currently hospitalized in this hospital.  I attempted to contact daughter without success, will place patient in a ED hold, all home meds have been ordered, consult to transition care was made to assist with finding safe housing until primary caregiver is released from the hospital.            Final Clinical Impression(s) / ED Diagnoses Final diagnoses:  Confusion  Dehydration    Rx / DC Orders ED Discharge Orders     None         Marcello Fennel, PA-C 03/09/22 0045    Fatima Blank, MD 03/09/22 [phone]

## 2022-03-09 NOTE — Progress Notes (Signed)
CM spoke with patient's daughter who reports that she is unable to physically come and care for the patient  and could not answer questions about finances, including assets or social security.  Daughter reports that there was a family disagreement in 2017 and after that time the patient and her significant have been very distant.  CM and daughter discussed long term planning including SNF vs ALF vs HH. Unfortunately daughter does not have much information about patient's individual finances but believes patient is able to physically care for herself at home.  CM reached out to [employer], patient's significant other and the person she is living with, Berdine Addison reports that patient is able to get around the home, access food, and get to the bathroom, but struggles with bipolar disorder and will spend long periods of time in bed.  [name] reports patient has outpatient mental health follow up and has been seeing the providers for 30+ years.  [name] shares that he will not be able to continue to care for the patient long term as they are getting older and would like to see patient ultimately transition to another living option but understands that patient has the right to make that decision.  [name] also shares that patient has access to some financial resources and a retirement account.  Discussed long term planning options SNF vs ALF vs HH.  Patient can return to home today with [facility] support per daughter and [name].  Daughter reports she has contacted the family friend Clair Gulling who has keys to the patients home and is available to pick her up from the ED.  Clair Gulling can be contacted at [phone] when ready to dc. MD and bedside RN updated and [place] orders requested.

## 2022-03-09 NOTE — Discharge Instructions (Signed)
Follow-up as instructed by behavioral health 

## 2022-03-09 NOTE — ED Provider Notes (Signed)
Patient will be discharged home and will   Milton Ferguson, MD 03/09/22 1733

## 2022-03-09 NOTE — Progress Notes (Signed)
This CSW attempted to contact the pt's daughter, via phone, without success. Left HIPAA Compliant voicemail.

## 2022-03-09 NOTE — Progress Notes (Addendum)
This CSW spoke with the pt's significant other, [name], who reports he is unable to take care of her. [name] states the pt's daughter should be responsible for her but "doesn't have anything to do with [place]." [name] reports there is no one else to provide support to the pt until he is discharged from the hospital. This CSW noted that the pt is unable to perform ADLs and has Medicare A/B without the waiver and does not have a 3 night IP stay within the past 30 days. This CSW will speak with leadership to discuss safe d/c plan.   Addend @ 10:44 AM This CSW received a call back from the pt's daughter who informed she is in [place] and will call me back once she speaks with her husband. She states she is only aware of a family friend named Clair Gulling who typically helps.   Addend @ 11:31 AM This CSW spoke to the pt's daughter who informed that the pt states Clair Gulling will be picking the pt up. This CSW attempted to contact Clair Gulling without success. It is noted that there is an Attending MD assigned to the pt and this CSW reached out via secure chat to verify whether the pt will be admitted.

## 2022-03-09 NOTE — Social Work (Signed)
CSW spoke to the patient's daughter, the daughter has stated that she can not care for the mother at this time. CSW asked the daughter about private pay, the daughter stated that could be an option for private care at the home. CSW reached out to supervisor to follow up with the patient's daughter about options. CSW will continue to update as they come.

## 2022-06-19 ENCOUNTER — Telehealth: Payer: Self-pay | Admitting: Licensed Clinical Social Worker

## 2022-06-19 NOTE — Telephone Encounter (Signed)
Patient's daughter Colletta Maryland called TOC to find out what are the requirements for a patient to go to a SNF. CSW explained to her it will depend on the insurance. Patient has Medicare A and B, CSW explained that patient would have to be admitted to the hospital under inpatient status for 3 days in order to be approved for SNF or pay privately. CSW asked if she has tried getting [facility] set up, however patient does not have a PCP. CSW advised her that patient unable to get [facility] set up w/o PCP.

## 2022-06-19 NOTE — TOC CM/SW Note (Signed)
Patient's daughter Colletta [place] called TOC to find out what are the requirements for a patient to go to a SNF. CSW explained to her it will depend on the insurance. Patient has Medicare A and B, CSW explained that patient would have to be admitted to the hospital under inpatient status for 3 days in order to be approved for SNF or pay privately. CSW asked if she has tried getting [facility] set up, however patient does not have a PCP. CSW advised her that patient unable to get [facility] set up w/o PCP.

## 2022-07-27 ENCOUNTER — Encounter (HOSPITAL_COMMUNITY): Payer: Self-pay

## 2022-10-28 ENCOUNTER — Emergency Department (HOSPITAL_COMMUNITY)
Admission: EM | Admit: 2022-10-28 | Discharge: 2022-10-28 | Disposition: A | Discharge status: Home / Self Care | Payer: Medicare Other | Attending: Emergency Medicine | Admitting: Emergency Medicine

## 2022-10-28 ENCOUNTER — Emergency Department (HOSPITAL_COMMUNITY): Payer: Medicare Other

## 2022-10-28 ENCOUNTER — Other Ambulatory Visit: Payer: Self-pay

## 2022-10-28 ENCOUNTER — Encounter (HOSPITAL_COMMUNITY): Payer: Self-pay | Admitting: Emergency Medicine

## 2022-10-28 DIAGNOSIS — Z7982 Long term (current) use of aspirin: Secondary | ICD-10-CM | POA: Insufficient documentation

## 2022-10-28 DIAGNOSIS — S0101XA Laceration without foreign body of scalp, initial encounter: Secondary | ICD-10-CM | POA: Diagnosis not present

## 2022-10-28 DIAGNOSIS — Z23 Encounter for immunization: Secondary | ICD-10-CM | POA: Insufficient documentation

## 2022-10-28 DIAGNOSIS — E119 Type 2 diabetes mellitus without complications: Secondary | ICD-10-CM | POA: Insufficient documentation

## 2022-10-28 DIAGNOSIS — W01190A Fall on same level from slipping, tripping and stumbling with subsequent striking against furniture, initial encounter: Secondary | ICD-10-CM | POA: Insufficient documentation

## 2022-10-28 DIAGNOSIS — W19XXXA Unspecified fall, initial encounter: Secondary | ICD-10-CM

## 2022-10-28 DIAGNOSIS — S0990XA Unspecified injury of head, initial encounter: Secondary | ICD-10-CM | POA: Diagnosis present

## 2022-10-28 MED ORDER — TETANUS-DIPHTH-ACELL PERTUSSIS 5-2.5-18.5 LF-MCG/0.5 IM SUSY
0.5000 mL | PREFILLED_SYRINGE | Freq: Once | INTRAMUSCULAR | Status: AC
  Administered 2022-10-28: 0.5 mL via INTRAMUSCULAR
  Filled 2022-10-28: qty 0.5

## 2022-10-28 MED ORDER — LIDOCAINE-EPINEPHRINE-TETRACAINE (LET) TOPICAL GEL
3.0000 mL | Freq: Once | TOPICAL | Status: AC
  Administered 2022-10-28: 3 mL via TOPICAL
  Filled 2022-10-28: qty 3

## 2022-10-28 MED ORDER — LIDOCAINE HCL (PF) 1 % IJ SOLN
5.0000 mL | Freq: Once | INTRAMUSCULAR | Status: AC
  Administered 2022-10-28: 5 mL via INTRADERMAL
  Filled 2022-10-28: qty 30

## 2022-10-28 NOTE — ED Notes (Signed)
Pt wound cleaned

## 2022-10-28 NOTE — ED Provider Notes (Signed)
Given age, I also evaluated her.  Large laceration on her scalp. Was bleeding when I evaluated her.  CTs were negative.  Given ongoing bleeding, opted for primary closure rapidly.  Informed patient that it is not a very cosmetic repair but given that it is in her hairline in her scalp, it should heal up appropriately. Marland Kitchen.Laceration Repair  Date/Time: 10/28/2022 8:53 PM  Performed by: Glyn Ade, MD Authorized by: Glyn Ade, MD   Consent:    Consent obtained:  Verbal   Consent given by:  Patient   Risks, benefits, and alternatives were discussed: yes     Risks discussed:  Infection, pain, poor cosmetic result and need for additional repair Laceration details:    Length (cm):  10 Pre-procedure details:    Preparation:  Patient was prepped and draped in usual sterile fashion and imaging obtained to evaluate for foreign bodies Skin repair:    Repair method:  Staples Approximation:    Approximation:  Close Repair type:    Repair type:  Simple Post-procedure details:    Dressing:  Open (no dressing)   Procedure completion:  Tolerated well, no immediate complications  Given negative CTs, patient stable for outpatient care and management.  Stated she was up-to-date on vaccinations.   Glyn Ade, MD 10/28/22 2055

## 2022-10-28 NOTE — ED Triage Notes (Signed)
Patient coming to ED for evaluation of fall with head injury.  Patient reports she tripped while in the kitchen.  Fell backwards and hit head on cabinet.  No LOC.  Fall was unwitnessed.  Not on blood thinners

## 2022-10-28 NOTE — ED Provider Notes (Signed)
[place] EMERGENCY DEPARTMENT AT [facility] Provider Note   CSN: [id number] Arrival date & time: 10/28/22  1904     History  Chief Complaint  Patient presents with   Denise Serrano is a 67 y.o. female who is here with her significant other.  She has a past medical history of bipolar disorder diabetes, multiple medications including benzodiazepines.  She reports she fell backward hitting her head on the cabinet.  Her significant other reports that she has been over using her benzodiazepines because she would sleep all the time and is always "drunk on her medicine.".  He reports that he has not talked to her prescribing physician about this states that she always lies about her medication use.  Patient complains of pain with a wound at the back of her head.  She is unsure of her last tetanus vaccination.   Fall       Home Medications Prior to Admission medications   Medication Sig Start Date End Date Taking? Authorizing Provider  albuterol (PROVENTIL) (2.5 MG/3ML) 0.083% nebulizer solution Take 3 mLs (2.5 mg total) by nebulization every 4 (four) hours as needed for wheezing or shortness of breath. 07/01/14   Renae Fickle, MD  ALPRAZolam (XANAX) 0.5 MG tablet TAKE 1 TABLET BY MOUTH FOUR TIMES A DAY AS NEEDED    [provider]  amLODipine (NORVASC) 5 MG tablet Take 1 tablet (5 mg total) by mouth daily. 11/13/20 10/02/21  Carmel Sacramento, MD  aspirin EC 81 MG tablet Take 81 mg by mouth daily.    [provider]  atorvastatin (LIPITOR) 20 MG tablet Take 20 mg by mouth daily. 03/25/16   [provider]  AUSTEDO XR 12 MG TB24 Take 1 tablet by mouth daily. 02/12/22   [provider]  AUSTEDO XR 24 MG TB24 Take 1 tablet by mouth daily. 02/12/22   [provider]  budesonide-formoterol (SYMBICORT) 80-4.5 MCG/ACT inhaler Inhale 2 puffs into the lungs 2 (two) times daily as needed (For shortness of breath).    [provider]  buPROPion (WELLBUTRIN XL) 300 MG 24 hr tablet Take 300 mg by mouth daily.    [provider]  cariprazine (VRAYLAR) 3 MG capsule Take by mouth daily. PM    [provider]  carvedilol (COREG) 12.5 MG tablet Take 12.5 mg by mouth 2 (two) times daily with a meal.    [provider]  cetirizine (ZYRTEC) 10 MG tablet Take 10 mg by mouth daily.    [provider]  famotidine (PEPCID) 40 MG tablet Take 40 mg by mouth daily. 02/11/22   [provider]  fesoterodine (TOVIAZ) 4 MG TB24 tablet Take 4 mg by mouth daily.    [provider]  gabapentin (NEURONTIN) 300 MG capsule Take 300 mg by mouth daily. 03/25/16   [provider]  levothyroxine (SYNTHROID, LEVOTHROID) 50 MCG tablet Take 50 mcg by mouth daily. 03/25/16   [provider]  lumateperone tosylate (CAPLYTA) 42 MG capsule Take 42 mg by mouth daily.    [provider]  mirabegron ER (MYRBETRIQ) 25 MG TB24 tablet Take 25 mg by mouth daily. am    [provider]  montelukast (SINGULAIR) 5 MG chewable tablet Chew 5 mg by mouth daily. am    [provider]  Multiple Vitamin (MULTIVITAMIN WITH MINERALS) TABS tablet Take 1 tablet by mouth 2 (two) times daily.    [provider]  traZODone (DESYREL) 50 MG  tablet Take 50 mg by mouth at bedtime as needed for sleep. 1-3 as needed for sleep    [provider]  valACYclovir (VALTREX) 500 MG tablet Take 500 mg by mouth 2 (two) times daily. As needed    [provider]  valbenazine (INGREZZA) 80 MG capsule 80 mg at bedtime.    [provider]      Allergies    Amoxicillin, Iodine, and Penicillin g    Review of Systems   Review of Systems  Physical Exam Updated Vital Signs BP (!) 145/80 (BP Location: Left Arm)   Pulse 68   Temp (!) 97.5 F (36.4 C) (Oral)   Resp 16   SpO2 100%  Physical Exam Vitals and nursing note reviewed.  Constitutional:      General: She is  not in acute distress.    Appearance: She is well-developed. She is not diaphoretic.  HENT:     Head: Normocephalic.     Comments: Nickel sized wound at the back of the occiput    Right Ear: External ear normal.     Left Ear: External ear normal.     Nose: Nose normal.     Mouth/Throat:     Mouth: Mucous membranes are moist.  Eyes:     General: No scleral icterus.    Conjunctiva/sclera: Conjunctivae normal.  Cardiovascular:     Rate and Rhythm: Normal rate and regular rhythm.     Heart sounds: Normal heart sounds. No murmur heard.    No friction rub. No gallop.  Pulmonary:     Effort: Pulmonary effort is normal. No respiratory distress.     Breath sounds: Normal breath sounds.  Abdominal:     General: Bowel sounds are normal. There is no distension.     Palpations: Abdomen is soft. There is no mass.     Tenderness: There is no abdominal tenderness. There is no guarding.  Musculoskeletal:     Cervical back: Normal range of motion.  Skin:    General: Skin is warm and dry.  Neurological:     General: No focal deficit present.     Mental Status: She is alert and oriented to person, place, and time.     Cranial Nerves: No cranial nerve deficit.     Sensory: No sensory deficit.     Motor: No weakness.  Psychiatric:        Behavior: Behavior normal.     ED Results / Procedures / Treatments   Labs (all labs ordered are listed, but only abnormal results are displayed) Labs Reviewed - No data to display  EKG None  Radiology No results found.  Procedures Procedures    Medications Ordered in ED Medications  Tdap (BOOSTRIX) injection 0.5 mL (has no administration in time range)  lidocaine (PF) (XYLOCAINE) 1 % injection 5 mL (has no administration in time range)  lidocaine-EPINEPHrine-tetracaine (LET) topical gel (has no administration in time range)    ED Course/ Medical Decision Making/ A&P Clinical Course as of 10/31/22 1318  Wed Oct 28, 2022  2040 CT Cervical  Spine Wo Contrast [AH]  2040 CT Head Wo Contrast Visualized and interpreted CT head and C-spine.  No acute findings noted. [AH]  2040 Fall, negative CT scans. [CC]    Clinical Course User Index [AH] Arthor Captain, PA-C [CC] Glyn Ade, MD  Medical Decision Making Patient with head injury I personally visualized and interpreted the images using our PACS system. Acute findings include:  None on CT imaging  Lac repaired by Dr. Doran Durand (see note)   F/u with pcp about polypharmacy concerns  Amount and/or Complexity of Data Reviewed Radiology: ordered. Decision-making details documented in ED Course.  Risk Prescription drug management.          Final Clinical Impression(s) / ED Diagnoses Final diagnoses:  None    Rx / DC Orders ED Discharge Orders     None         Arthor Captain, PA-C 10/31/22 1320    Glyn Ade, MD 10/31/22 1504

## 2023-07-09 ENCOUNTER — Emergency Department (HOSPITAL_COMMUNITY)
Admission: EM | Admit: 2023-07-09 | Discharge: 2023-07-11 | Disposition: A | Discharge status: Home / Self Care | Attending: Emergency Medicine | Admitting: Emergency Medicine

## 2023-07-09 DIAGNOSIS — E119 Type 2 diabetes mellitus without complications: Secondary | ICD-10-CM | POA: Diagnosis not present

## 2023-07-09 DIAGNOSIS — R627 Adult failure to thrive: Secondary | ICD-10-CM | POA: Insufficient documentation

## 2023-07-09 DIAGNOSIS — R531 Weakness: Secondary | ICD-10-CM | POA: Insufficient documentation

## 2023-07-09 LAB — CBC WITH DIFFERENTIAL/PLATELET
Abs Immature Granulocytes: 0.02 10*3/uL (ref 0.00–0.07)
Basophils Absolute: 0.1 10*3/uL (ref 0.0–0.1)
Basophils Relative: 1 %
Eosinophils Absolute: 0.2 10*3/uL (ref 0.0–0.5)
Eosinophils Relative: 2 %
HCT: 41.7 % (ref 36.0–46.0)
Hemoglobin: 13.9 g/dL (ref 12.0–15.0)
Immature Granulocytes: 0 %
Lymphocytes Relative: 27 %
Lymphs Abs: 2.1 10*3/uL (ref 0.7–4.0)
MCH: 33.7 pg (ref 26.0–34.0)
MCHC: 33.3 g/dL (ref 30.0–36.0)
MCV: 101 fL — ABNORMAL HIGH (ref 80.0–100.0)
Monocytes Absolute: 0.6 10*3/uL (ref 0.1–1.0)
Monocytes Relative: 8 %
Neutro Abs: 4.9 10*3/uL (ref 1.7–7.7)
Neutrophils Relative %: 62 %
Platelets: 147 10*3/uL — ABNORMAL LOW (ref 150–400)
RBC: 4.13 MIL/uL (ref 3.87–5.11)
RDW: 12 % (ref 11.5–15.5)
WBC: 7.8 10*3/uL (ref 4.0–10.5)
nRBC: 0 % (ref 0.0–0.2)

## 2023-07-09 LAB — MAGNESIUM: Magnesium: 1.6 mg/dL — ABNORMAL LOW (ref 1.7–2.4)

## 2023-07-09 LAB — COMPREHENSIVE METABOLIC PANEL WITH GFR
ALT: 16 U/L (ref 0–44)
AST: 17 U/L (ref 15–41)
Albumin: 3.5 g/dL (ref 3.5–5.0)
Alkaline Phosphatase: 70 U/L (ref 38–126)
Anion gap: 12 (ref 5–15)
BUN: 20 mg/dL (ref 8–23)
CO2: 25 mmol/L (ref 22–32)
Calcium: 9.6 mg/dL (ref 8.9–10.3)
Chloride: 103 mmol/L (ref 98–111)
Creatinine, Ser: 2.14 mg/dL — ABNORMAL HIGH (ref 0.44–1.00)
GFR, Estimated: 25 mL/min — ABNORMAL LOW (ref >60)
Glucose, Bld: 93 mg/dL (ref 70–99)
Potassium: 4.1 mmol/L (ref 3.5–5.1)
Sodium: 140 mmol/L (ref 135–145)
Total Bilirubin: 0.7 mg/dL (ref 0.0–1.2)
Total Protein: 6.3 g/dL — ABNORMAL LOW (ref 6.5–8.1)

## 2023-07-09 LAB — CK: Total CK: 39 U/L (ref 38–234)

## 2023-07-09 MED ORDER — ACETAMINOPHEN 325 MG PO TABS: 650.0000 mg | ORAL_TABLET | ORAL | Status: DC | PRN

## 2023-07-09 MED ORDER — ONDANSETRON HCL 4 MG PO TABS: 4.0000 mg | ORAL_TABLET | Freq: Three times a day (TID) | ORAL | Status: DC | PRN

## 2023-07-09 MED ORDER — LUMATEPERONE TOSYLATE 42 MG PO CAPS
42.0000 mg | ORAL_CAPSULE | Freq: Every day | ORAL | Status: DC
  Administered 2023-07-10: 42 mg via ORAL
  Filled 2023-07-09 (×3): qty 1

## 2023-07-09 MED ORDER — ASPIRIN 81 MG PO TBEC
81.0000 mg | DELAYED_RELEASE_TABLET | Freq: Every day | ORAL | Status: DC
  Administered 2023-07-10: 81 mg via ORAL
  Filled 2023-07-09: qty 1

## 2023-07-09 MED ORDER — ATORVASTATIN CALCIUM 10 MG PO TABS
20.0000 mg | ORAL_TABLET | Freq: Every day | ORAL | Status: DC
Start: 2023-07-10 — End: 2023-07-11
  Administered 2023-07-10: 20 mg via ORAL
  Filled 2023-07-09: qty 2

## 2023-07-09 MED ORDER — CARVEDILOL 12.5 MG PO TABS
12.5000 mg | ORAL_TABLET | Freq: Two times a day (BID) | ORAL | Status: DC
  Filled 2023-07-09: qty 1

## 2023-07-09 NOTE — ED Notes (Addendum)
 Patient ambulated to the restroom with minimal assist. No complaints of weakness.

## 2023-07-09 NOTE — ED Notes (Signed)
 Pt ambulated with minimal assistance, stated that "her right side feels weaker but she is walking better than she did this morning."

## 2023-07-09 NOTE — ED Triage Notes (Signed)
 Patient BIB [facility] for failure to thrive from home. Spouse states she hasn't been eating drinking, Generalized weakness, staying in bed 20hrs a day. Right leg weakness. 2022 Cervical fracture. Worsen in past 6 months.  Hx Bipolar.

## 2023-07-10 ENCOUNTER — Encounter (HOSPITAL_COMMUNITY): Payer: Self-pay

## 2023-07-10 ENCOUNTER — Other Ambulatory Visit: Payer: Self-pay

## 2023-07-10 NOTE — ED Notes (Signed)
 PT wheeled to the bathroom to urinate

## 2023-07-10 NOTE — Discharge Instructions (Signed)
 Denise Serrano was seen in the emergency room for generalized weakness. She received physical therapy assessment in the ER, and the finding is that she certainly needs assistance moving around.  At this time, she does not need to be in a skilled nursing facility, but it is possible that her condition can deteriorate to that point.  She has been able to ambulate in the ER.  She has participated in physical therapy.  She has had 2 meals in the ER.  It is unclear why she is having weakness.  Blood work in the ER is reassuring.  We recommend that she see her PCP and her psychiatrist to see if her medications could be contributing to her waxing and waning weakness.  We have ordered home health, they will start coming in to see the patient. At your request, wheelchair also has been ordered.

## 2023-07-10 NOTE — Evaluation (Signed)
 Physical Therapy Evaluation Patient Details Name: Ane Conerly MRN: [id number] DOB: 1955/12/12 Today's Date: 07/10/2023  History of Present Illness  Pt is a 68 year old female brought to the ED for failure to thrive. Hx os bipolar and cervical fx in 2022.  Clinical Impression  Pt presents with admitting diagnosis above. Pt today was able to ambulate short distance in hallway with RW Min A. Pt was not the greatest historian however reports that she has had numerous falls PTA and that her husband is not the greatest help at home. Recommend HHPT with WC as pt does not qualify for SNF. PT will continue to follow.         If plan is discharge home, recommend the following: A little help with walking and/or transfers;A little help with bathing/dressing/bathroom;Assistance with cooking/housework;Assist for transportation;Help with stairs or ramp for entrance;Supervision due to cognitive status   Can travel by private vehicle        Equipment Recommendations Wheelchair (measurements PT);Wheelchair cushion (measurements PT)  Recommendations for Other Services  OT consult    Functional Status Assessment Patient has had a recent decline in their functional status and demonstrates the ability to make significant improvements in function in a reasonable and predictable amount of time.     Precautions / Restrictions Precautions Precautions: Fall Recall of Precautions/Restrictions: Intact Restrictions Weight Bearing Restrictions Per Provider Order: No      Mobility  Bed Mobility Overal bed mobility: Needs Assistance Bed Mobility: Sit to Supine       Sit to supine: Contact guard assist   General bed mobility comments: no physical assistance provided. Pt received in [facility] coming back from bathroom with nursing.    Transfers Overall transfer level: Needs assistance Equipment used: Rolling walker (2 wheels) Transfers: Sit to/from Stand Sit to Stand: Contact guard assist            General transfer comment: Cues for hand placement    Ambulation/Gait Ambulation/Gait assistance: Min assist Gait Distance (Feet): 20 Feet Assistive device: Rolling walker (2 wheels) Gait Pattern/deviations: Trunk flexed, Shuffle, Decreased stride length, Step-through pattern Gait velocity: decreased     General Gait Details: no LOB noted however pt ambulated with a slow almost shuffling gait. Pt reports this is baseline for her however has had numerous falls.  Stairs            Wheelchair Mobility     Tilt Bed    Modified Rankin (Stroke Patients Only)       Balance Overall balance assessment: History of Falls, Needs assistance Sitting-balance support: Bilateral upper extremity supported, Feet supported Sitting balance-Leahy Scale: Good     Standing balance support: Bilateral upper extremity supported, During functional activity Standing balance-Leahy Scale: Poor Standing balance comment: Reliant on RW                             Pertinent Vitals/Pain Pain Assessment Pain Assessment: No/denies pain    Home Living Family/patient expects to be discharged to:: Private residence Living Arrangements: Spouse/significant other Available Help at Discharge: Available 24 hours/day Type of Home: House Home Access: Ramped entrance     Alternate Level Stairs-Number of Steps: flight Home Layout: Two level;Laundry or work area in basement;Able to live on main level with bedroom/bathroom Home Equipment: Agricultural consultant (2 wheels);Shower seat;Cane - quad;Cane - single point      Prior Function Prior Level of Function : History of Falls (last six months);Independent/Modified Independent;Needs assist  Mobility Comments: Pt reports short distances with RW however endorses multiple falls ADLs Comments: pt reports ind     Extremity/Trunk Assessment   Upper Extremity Assessment Upper Extremity Assessment: Generalized weakness    Lower  Extremity Assessment Lower Extremity Assessment: Generalized weakness    Cervical / Trunk Assessment Cervical / Trunk Assessment: Normal  Communication   Communication Communication: Impaired Factors Affecting Communication: Hearing impaired    Cognition Arousal: Alert Behavior During Therapy: Flat affect   PT - Cognitive impairments: No family/caregiver present to determine baseline                       PT - Cognition Comments: no family present however some slow processing noted. Following commands: Impaired Following commands impaired: Follows one step commands with increased time     Cueing Cueing Techniques: Verbal cues, Tactile cues     General Comments General comments (skin integrity, edema, etc.): VSS    Exercises     Assessment/Plan    PT Assessment Patient needs continued PT services  PT Problem List Decreased strength;Decreased range of motion;Decreased activity tolerance;Decreased mobility;Decreased coordination;Decreased balance;Decreased cognition;Decreased knowledge of use of DME;Decreased safety awareness;Cardiopulmonary status limiting activity;Decreased knowledge of precautions       PT Treatment Interventions DME instruction;Gait training;Functional mobility training;Stair training;Therapeutic exercise;Therapeutic activities;Balance training;Cognitive remediation;Neuromuscular re-education;Patient/family education;Wheelchair mobility training    PT Goals (Current goals can be found in the Care Plan section)  Acute Rehab PT Goals Patient Stated Goal: to go home PT Goal Formulation: With patient Time For Goal Achievement: 07/24/23 Potential to Achieve Goals: Good    Frequency Min 3X/week     Co-evaluation               AM-PAC PT "6 Clicks" Mobility  Outcome Measure Help needed turning from your back to your side while in a flat bed without using bedrails?: A Little Help needed moving from lying on your back to sitting on the  side of a flat bed without using bedrails?: A Little Help needed moving to and from a bed to a chair (including a wheelchair)?: A Little Help needed standing up from a chair using your arms (e.g., wheelchair or bedside chair)?: A Little Help needed to walk in hospital room?: A Little Help needed climbing 3-5 steps with a railing? : A Lot 6 Click Score: 17    End of Session Equipment Utilized During Treatment: Gait belt Activity Tolerance: Patient tolerated treatment well Patient left: in bed;with call bell/phone within reach Nurse Communication: Mobility status PT Visit Diagnosis: Other abnormalities of gait and mobility (R26.89)    Time: 1610-9604 PT Time Calculation (min) (ACUTE ONLY): 14 min   Charges:   PT Evaluation $PT Eval Moderate Complexity: 1 Mod   PT General Charges $$ ACUTE PT VISIT: 1 Visit         Rodgers Clack, PT, DPT Acute Rehab Services [phone]   Dierdra Salameh 07/10/2023, 2:40 PM

## 2023-07-10 NOTE — ED Provider Notes (Signed)
 [place] EMERGENCY DEPARTMENT AT [facility] Provider Note   CSN: [id number] Arrival date & time: 07/09/23  1758     History  Chief Complaint  Patient presents with   Failure To Thrive    Denise Serrano is a 68 y.o. female.  HPI     68 year old female comes in with chief complaint of failure to thrive. Patient has history of GERD, diabetes, gait difficulty.  According to nursing note, spouse indicated that patient has not been eating or drinking well.  She has been sleeping a lot.  Patient states that she normally needs a walker to walk around.  She has chronic right-sided weakness.  However, just few days ago she had gone to a restaurant with her friends.  In the last few days she has had increasing weakness and having difficulty walking.  She has bipolar disorder and there was some medication change made 2 weeks ago.  She effectively went back on the medication that was discontinued and a new medication was started.  I spoke with patient's significant other.  He indicates that patient goes through the cycles of severe weakness.  Her severe weakness will last for few days sometimes.  He confirms that she has chronic right-sided weakness and foot drop.  He denies any recent illnesses, trauma.  He does indicate that its become difficult for him to manage her when she gets this bad.  There is no home health.  She has received physical therapy in the past.  Home Medications Prior to Admission medications   Medication Sig Start Date End Date Taking? Authorizing Provider  ALPRAZolam  (XANAX ) 0.5 MG tablet TAKE 1 TABLET BY MOUTH FOUR TIMES A DAY AS NEEDED   Yes [provider]  amLODipine  (NORVASC ) 2.5 MG tablet Take 2.5 mg by mouth daily. 06/16/23  Yes [provider]  atorvastatin  (LIPITOR) 20 MG tablet Take 20 mg by mouth daily. 03/25/16  Yes [provider]  AUSTEDO  XR 48 MG TB24 Take 1 tablet by mouth daily. 06/16/23  Yes [provider]   buPROPion  (WELLBUTRIN  XL) 150 MG 24 hr tablet Take 150 mg by mouth daily. Take one (150mg ) with one (300mg ) tablet for a total dose of 450mg  06/17/23  Yes [provider]  buPROPion  (WELLBUTRIN  XL) 300 MG 24 hr tablet Take 300 mg by mouth daily. Take one (300mg ) with one (150mg ) tablet for a total dose of 450mg    Yes [provider]  carvedilol  (COREG ) 12.5 MG tablet Take 12.5 mg by mouth 2 (two) times daily with a meal.   Yes [provider]  cetirizine (ZYRTEC) 10 MG tablet Take 10 mg by mouth daily.   Yes [provider]  famotidine  (PEPCID ) 40 MG tablet Take 40 mg by mouth daily. 02/11/22  Yes [provider]  FANAPT 8 MG TABS Take 1 tablet by mouth 2 (two) times daily. 06/22/23  Yes [provider]  fesoterodine (TOVIAZ) 4 MG TB24 tablet Take 4 mg by mouth daily.   Yes [provider]  gabapentin  (NEURONTIN ) 300 MG capsule Take 300 mg by mouth 2 (two) times daily. 03/25/16  Yes [provider]  levothyroxine  (SYNTHROID , LEVOTHROID) 50 MCG tablet Take 50 mcg by mouth daily. 03/25/16  Yes [provider]  lumateperone  tosylate (CAPLYTA ) 42 MG capsule Take 42 mg by mouth daily.   Yes [provider]  mirabegron  ER (MYRBETRIQ ) 25 MG TB24 tablet Take 25 mg by mouth daily. am   Yes [provider]  Multiple Vitamin (MULTIVITAMIN WITH MINERALS) TABS tablet Take 1 tablet by mouth 2 (two) times daily.   Yes [provider]  sertraline (ZOLOFT) 25 MG tablet Take 25 mg by mouth daily. 05/17/23  Yes [provider]  traZODone  (DESYREL ) 50 MG tablet Take 50 mg by mouth at bedtime as needed for sleep. 1-3 as needed for sleep   Yes [provider]  valACYclovir (VALTREX) 500 MG tablet Take 500 mg by mouth 2 (two) times daily. As needed   Yes [provider]      Allergies    Amoxicillin, Iodine, and Penicillin g    Review of Systems   Review of Systems  All other systems  reviewed and are negative.   Physical Exam Updated Vital Signs BP 135/68   Pulse 61   Temp 98 F (36.7 C) (Axillary)   Resp 11   Ht 5' (1.524 m)   Wt 62.1 kg   SpO2 98%   BMI 26.74 kg/m  Physical Exam Vitals and nursing note reviewed.  Constitutional:      Appearance: She is well-developed.  HENT:     Head: Atraumatic.  Eyes:     Extraocular Movements: Extraocular movements intact.  Cardiovascular:     Rate and Rhythm: Normal rate.  Pulmonary:     Effort: Pulmonary effort is normal.  Musculoskeletal:     Cervical back: Normal range of motion and neck supple.  Skin:    General: Skin is dry.  Neurological:     Mental Status: She is alert and oriented to person, place, and time.     Sensory: No sensory deficit.     Motor: No weakness.     Gait: Gait abnormal.     Comments: Patient upper and lower extremity strength is 4+ out of 5.  Gross sensory exam is normal.  I ambulated the patient with nurse.  Patient was able to walk with us  assisting.  She indicated that the right side felt weak, but also indicated that that is normal for her     ED Results / Procedures / Treatments   Labs (all labs ordered are listed, but only abnormal results are displayed) Labs Reviewed  COMPREHENSIVE METABOLIC PANEL WITH GFR - Abnormal; Notable for the following components:      Result Value   Creatinine, Ser 2.14 (*)    Total Protein 6.3 (*)    GFR, Estimated 25 (*)    All other components within normal limits  CBC WITH DIFFERENTIAL/PLATELET - Abnormal; Notable for the following components:   MCV 101.0 (*)    Platelets 147 (*)    All other components within normal limits  MAGNESIUM - Abnormal; Notable for the following components:   Magnesium 1.6 (*)    All other components within normal limits  SARS CORONAVIRUS 2 (TAT 6-24 HRS)  CK    EKG None  Radiology No results found.  Procedures Procedures    Medications Ordered in ED Medications  acetaminophen  (TYLENOL ) tablet  650 mg (has no administration in time range)  ondansetron  (ZOFRAN ) tablet 4 mg (has no administration in time range)  aspirin  EC tablet 81 mg (81 mg Oral Given 07/10/23 0819)  atorvastatin  (LIPITOR) tablet 20 mg (20 mg Oral Given 07/10/23 0820)  carvedilol  (COREG ) tablet 12.5 mg (12.5 mg Oral Not Given 07/10/23 0820)  lumateperone  tosylate (CAPLYTA ) capsule 42 mg (42 mg Oral Given 07/10/23 1017)    ED Course/ Medical Decision Making/ A&P  Medical Decision Making Amount and/or Complexity of Data Reviewed Labs: ordered. Radiology: ordered.  Risk OTC drugs. Prescription drug management.  Is 68 year old female with history of bipolar disorder, diabetes, chronic ataxic gait, chronic right-sided weakness comes in with chief complaint of weakness.  Patient states that she was able to walk 5 days ago, but now having trouble walking.  Normally she does use a walker.  She denies any nausea, vomiting, fevers, chills but does have reduced p.o. intake and loss of appetite.  She denies any UTI-like symptoms.   Collateral history provided by patient's significant other.  He confirms that patient has not had any fevers or chills or falls.  He also confirms chronic right-sided weakness and states that patient has off-and-on episodes of profound weakness.  Differential diagnosis for this patient includes severe metabolic derangement, electrolyte abnormality, UTI, AKI, stroke, neuromuscular disorder, proximal muscle weakness.  We will consult PT/OT, social work. MRI was considered to rule out stroke, but patient and the husband confirmed that patient has chronic right-sided weakness.  Of note, I was able to ambulate the patient with assistance with the nurse.  She did quite well.  She states that she feels a lot better now than she did at home.  This is encouraging.  Will await PT and social work recommendations.  Final Clinical Impression(s) / ED Diagnoses Final  diagnoses:  Generalized weakness    Rx / DC Orders ED Discharge Orders          Ordered    Home Health        07/10/23 1425    Face-to-face encounter (required for Medicare/Medicaid patients)       Comments: I Jeani Fassnacht certify that this patient is under my care and that I, or a nurse practitioner or physician's assistant working with me, had a face-to-face encounter that meets the physician face-to-face encounter requirements with this patient on 07/10/2023. The encounter with the patient was in whole, or in part for the following medical condition(s) which is the primary reason for home health care (List medical condition): Patient has weakness, right-sided foot drop that leads to difficulty in ambulation.  She has psychiatric history with multiple medications on board.  She has episodes of severe weakness, that leaves her bedbound.  Needs close monitoring, home PT.   07/10/23 1425              Deatra Face, MD 07/10/23 1433

## 2023-07-10 NOTE — Care Management (Signed)
 Consult for DME [facility]. She previously had home health in 2022 with [facility], a highly rated agency She has been accepted for PT OT and social work from [employer] at [facility]. [employer] will bring lightweight wheelchair to hospital room. Dr Lewis Red spoke to her husband, he will pick her up at 4pm

## 2023-07-10 NOTE — ED Provider Notes (Signed)
 Emergency Medicine Observation Re-evaluation Note  Denise Serrano is a 68 y.o. female, seen on rounds today.  Pt initially presented to the ED for complaints of Failure To Thrive Currently, the patient is resting comfortably.. She has finished meals as provided.  She has ambulated.  She was able to participate with physical therapy earlier today.  She states that she feels better today than she did before she came to the ER.  I have spoken with patient's spouse as well.  Based on information provided by patient and the spouse, MRI of the brain has been canceled.  Social work team indicated that [employer] would not let her go to rehab facility unless she had 3-day admission that was inpatient.  Patient does not need inpatient admission at this time.  Physical therapy team has assessed the patient.  They do recommend home health, and voiced that if she deteriorates further that she might need to go to rehab.  This recommendation has been discussed with patient and the family as well.  Family is requesting that we provide wheelchair.  She already has walker at home.  Stable for discharge at this time.  Significant other will pick her up around 4 PM.  Physical Exam  BP 135/68   Pulse 61   Temp 98 F (36.7 C) (Axillary)   Resp 11   Ht 5' (1.524 m)   Wt 62.1 kg   SpO2 98%   BMI 26.74 kg/m  Physical Exam General: No acute distress Cardiac: Regular rate Lungs: No respiratory distress Psych: Currently calm  ED Course / MDM  EKG:   I have reviewed the labs performed to date as well as medications administered while in observation.  Recent changes in the last 24 hours include -no new changes.  PT assessment completed..  Plan  Current plan is for discharge with home health.  I am considering giving her fosfomycin as a one-time dose.  We do not have UA.  If patient's urinalysis does not result, then we will give her fosfomycin before she goes.  If the UA is back and it shows no  evidence of UTI then she will be discharged without antibiotics.     Deatra Face, MD 07/10/23 1435

## 2023-07-10 NOTE — Progress Notes (Signed)
 CSW received consult for patient for possible SNF placement.  Patient has traditional Medicare that requires a 3 midnight stay in inpatient status to qualify for SNF. Patient does not have a qualifying stay at this time and will not qualify for SNF.  Per RN note last night, patient ambulated to the bathroom with minimal assistance.  Shepard Dicker, MSW, LCSW Transitions of Care  Clinical Social Worker II [phone]

## 2023-07-10 NOTE — ED Notes (Signed)
 Wheelchair arrived awaiting family member

## 2023-07-12 LAB — SARS CORONAVIRUS 2 (TAT 6-24 HRS): SARS Coronavirus 2: NEGATIVE

## 2023-10-12 NOTE — Progress Notes (Signed)
 Subjective   HPI:  Denise Serrano is a 68 y.o.  female who presents to the office with:  Chief Complaint  Patient presents with  . Diabetes    3 month follow up for A1c, and unsteady gait, possible bed sore on right hip    Since last seen feels is back at baseline.  Continues to struggle with depression- noted some weight loss- she reports due to poor appetite and anhedonia.  Examined right hip- small area of dry skin- no pressure ulcer.  Rec moisturizer prn- ok to use her topical steroid for itching.  She was discharged from Home  health June 19th due to meeting mobility goals  She reports is working to maintain daily activity.  Diabetes Follow-up  Hemoglobin A1c  Date Value Ref Range Status  10/12/2023 5.6 4.8 - 5.6 % Final  07/13/2023 5.6 4.8 - 5.6 % Final  02/05/2023 5.7 (A) 4.8 - 5.6 % Preliminary  10/16/2022 5.3 4.8 - 5.6 % Final       Health Maintenance  Topic Date Due  . Diabetes Eye Exam  03/02/2024  . Diabetes Hemoglobin A1C  04/13/2024  . Diabetes Foot Exam  04/13/2024  . Diabetes Lipid Profile  07/12/2024    Body mass index is 22.33 kg/m. Wt Readings from Last 3 Encounters:  10/12/23 118 lb 3.2 oz (53.6 kg)  09/03/23 120 lb 9.6 oz (54.7 kg)  07/13/23 124 lb (56.2 kg)    Hypertension Follow-up  The patient is taking hypertensive medications compliantly without side effects.  Denies chest pain, dyspnea, edema, or TIA's.   BP Readings from Last 3 Encounters:  10/12/23 116/79  09/03/23 138/76  07/13/23 128/79    Hypertension Medications    Alpha-Beta Blockers     * carvedilol  (COREG ) 12.5 mg tablet Take one tablet (12.5 mg dose) by mouth 2 (two) times daily.      Calcium  Channel Blockers     * amLODIPine  besylate (NORVASC ) 2.5 mg tablet Take one tablet (2.5 mg dose) by mouth daily.       Tobacco Use History[1]  Hyperlipidemia Follow-up  Taking medications: taking as prescribed Side effects: no  Dyslipidemia Medications    HMG CoA  Reductase Inhibitors     * atorvastatin  (LIPITOR) 20 mg tablet TAKE 1 TABLET EVERY DAY       Last Resulted Components     Date/Time Component Value Flag Units Reference Range Lab Status   07/13/23 1414 CHOL 175 -- mg/dL 899 - 800 mg/dL Final result   95/77/74 1414 HDL 62 -- mg/dL >60 mg/dL Final result   95/77/74 1414 LDL 92 -- mg/dL 0 - 99 mg/dL Final result   95/77/74 1414 TRIG 118 -- mg/dL 0 - 850 mg/dL Final result   95/77/74 1354 HGBA1C 5.6 -- % 4.8 - 5.6 % Final result   09/03/23 1544 GLUCOSE 84 -- mg/dL 70 - 99 mg/dL Final result         ROS: Negative for constitutional, CV, Resp, Abd/GU symptoms except as noted above in HPI.  PMH: Past medical history, Past surgical history, Social history, family history were reviewed as noted in EMR.  Pertinent for:  Past Medical History:  Diagnosis Date  . Arthritis   . Atrophic vaginitis 11/21/2014  . Bipolar disorder (*)   . BPPV (benign paroxysmal positional vertigo) 12/12/2014  . Cataracts, bilateral   . CKD (chronic kidney disease), stage IV (*)   . Excessive cerumen in both ear canals 07/16/2021  . Hearing loss  Left  . History of cervical fracture 03/26/2021   C6, 2022  . Hypertension   . Osteopenia   . Right ankle pain 06/24/2015   Arthritis- has seen orthopedist  . Sleep apnea   . Thyroid  disease      Medications and allergies reviewed.   Objective    Physical Exam:  Vital Signs: BP 116/79 (BP Location: Left Upper Arm, Patient Position: Sitting)   Pulse 82   Temp 98.7 F (37.1 C) (Oral)   Resp 18   Ht 5' 1 (1.549 m)   Wt 118 lb 3.2 oz (53.6 kg)   SpO2 97%   BMI 22.33 kg/m   General:  Well appearing, Alert & Oriented CV:  Normal to palpation without thrill.  Regular Rate and Rhythm, No murmurs, rubs, or gallops.   Respiratory:  Normal respiratory effort.  No wheezes or crackles.  Good air movement without diminished sounds.   Abdomen:  Normal bowel sounds.  Non-tender to palpation.  No rebound or  guarding.  No palpable liver or spleen. Extremities:  No pretibial edema  Assessment/Plan    Orders Placed This Encounter  Procedures  . POCT Hemoglobin A1C    Type 2 diabetes mellitus with chronic kidney disease, without long-term current use of insulin   (*) Lab Results  Component Value Date   Hemoglobin A1c 5.6 10/12/2023   At goal, off meds   HTN (hypertension)  BP stable  Unsteady gait Completed home health PT June 2025   Encounter for screening involving social determinants of health (SDoH)    Weight loss  Gradual decline, no localizing symptoms and is UTD on routine cancer screening.  Anhedonia likely playing a role.  Will focus on encouraged ing high protein foods, reassess in 3-4 months, sooner if any concerns or new symptoms.   Future Appointments  Date Time Provider Department Center  02/08/2024  1:30 PM Luke MARLA Manns, MD [facility] None    Medications at end of visit today: Current Medications[2]        [1] Social History Tobacco Use  Smoking Status Never  . Passive exposure: Never  Smokeless Tobacco Never  [2]  Current Outpatient Medications:  .  albuterol  sulfate HFA (PROVENTIL ,VENTOLIN ,PROAIR ) 108 (90 Base) MCG/ACT inhaler, INHALE 1 PUFF INTO THE LUNGS EVERY SIX HOURS AS NEEDED FOR WHEEZING, Disp: 6.7 g, Rfl: 1 .  alprazolam  (XANAX ) 0.5 MG tablet, Take one tablet (0.5 mg dose) by mouth at bedtime as needed., Disp: , Rfl:  .  amLODIPine  besylate (NORVASC ) 2.5 mg tablet, Take one tablet (2.5 mg dose) by mouth daily., Disp: 90 tablet, Rfl: 3 .  atorvastatin  (LIPITOR) 20 mg tablet, TAKE 1 TABLET EVERY DAY, Disp: 100 tablet, Rfl: 3 .  AUSTEDO  XR 48 MG TB24, Take 1 tablet by mouth daily., Disp: , Rfl:  .  Blood Pressure KIT, Check BP daily, Disp: 1 each, Rfl: 0 .  buPROPion  HCl (WELLBUTRIN  XL) 150 mg 24 hr tablet, Take one tablet (150 mg dose) by mouth daily., Disp: , Rfl:  .  buPROPion  hcl (WELLBUTRIN  XL) 300 mg 24 hr tablet, Take one tablet (300 mg  dose) by mouth every morning. On and a half 450 mg, Disp: , Rfl:  .  CAPLYTA  42 MG CAPS, Take one capsule (42 mg dose) by mouth daily., Disp: , Rfl:  .  carvedilol  (COREG ) 12.5 mg tablet, Take one tablet (12.5 mg dose) by mouth 2 (two) times daily., Disp: 180 tablet, Rfl: 3 .  cetirizine (ZYRTEC) 10 mg  tablet, TAKE 1 TABLET BY MOUTH DAILY, Disp: 100 tablet, Rfl: 3 .  docusate sodium  (COLACE,DOK,DOCQLACE) capsule, TAKE 1 CAPSULE BY MOUTH DAILY, Disp: 100 capsule, Rfl: 3 .  famotidine  (PEPCID ) 40 mg tablet, TAKE 1 TABLET BY MOUTH IN THE EVENING, Disp: 100 tablet, Rfl: 1 .  fesoterodine fumarate ER (TOVIAZ) 4 mg, TAKE 1 TABLET BY MOUTH DAILY, Disp: 100 tablet, Rfl: 1 .  fluocinonide (LIDEX) 0.05% external solution, APPLY TOPICALLY TO ITCHY FLAKY AREAS ON SCALP TWICE A DAY, Disp: 60 mL, Rfl: 3 .  gabapentin  (NEURONTIN ) 300 mg capsule, TAKE ONE CAPSULE (300 MG DOSE) BY MOUTH 2 (TWO) TIMES DAILY., Disp: 180 capsule, Rfl: 3 .  glucose blood (ONETOUCH VERIO) test strip, TEST BLOOD SUGAR TWICE A DAY, Disp: 100 strip, Rfl: 11 .  ketoconazole (NIZORAL) 2% shampoo, Apply topically 2 (two) times a week., Disp: 120 mL, Rfl: 11 .  Lancets (ONETOUCH DELICA PLUS LANCET33G) MISC, Once daily as needed, Disp: 100 each, Rfl: 3 .  levothyroxine  sodium (SYNTHROID ,LEVOTHROID,LOVOXYL) 50 mcg tablet, TAKE 1 TABLET BY MOUTH DAILY, Disp: 100 tablet, Rfl: 1 .  mirabegron  (MYRBETRIQ ) 25 mg TB24 24 hr tablet, Take one tablet (25 mg dose) by mouth daily., Disp: 30 tablet, Rfl: 2 .  Multiple Vitamin (MULTIVITAMIN) TABS, TAKE 1 TABLET BY MOUTH DAILY, Disp: 100 tablet, Rfl: 3 .  traZODone  (DESYREL ) 50 MG tablet, SMARTSIG:1-3 Tablet(s) By Mouth Every Night, Disp: , Rfl:  .  valACYclovir (VALTREX) 500 mg tablet, TAKE 1 TABLET BY MOUTH DAILY, Disp: 100 tablet, Rfl: 1*Some images could not be shown.
